# Patient Record
Sex: Male | Born: 1940
Health system: Southern US, Community
[De-identification: ages and names within clinical notes are randomized; demographics above are authoritative.]

## PROBLEM LIST (undated history)

## (undated) DIAGNOSIS — R112 Nausea with vomiting, unspecified: Secondary | ICD-10-CM

## (undated) DIAGNOSIS — I208 Other forms of angina pectoris: Secondary | ICD-10-CM

## (undated) DIAGNOSIS — I10 Essential (primary) hypertension: Secondary | ICD-10-CM

## (undated) DIAGNOSIS — M1990 Unspecified osteoarthritis, unspecified site: Secondary | ICD-10-CM

## (undated) DIAGNOSIS — Z923 Personal history of irradiation: Secondary | ICD-10-CM

## (undated) DIAGNOSIS — I2510 Atherosclerotic heart disease of native coronary artery without angina pectoris: Secondary | ICD-10-CM

## (undated) DIAGNOSIS — F32A Depression, unspecified: Secondary | ICD-10-CM

## (undated) DIAGNOSIS — E119 Type 2 diabetes mellitus without complications: Secondary | ICD-10-CM

## (undated) DIAGNOSIS — K219 Gastro-esophageal reflux disease without esophagitis: Secondary | ICD-10-CM

## (undated) DIAGNOSIS — J439 Emphysema, unspecified: Secondary | ICD-10-CM

## (undated) DIAGNOSIS — R42 Dizziness and giddiness: Secondary | ICD-10-CM

## (undated) DIAGNOSIS — E7800 Pure hypercholesterolemia, unspecified: Secondary | ICD-10-CM

## (undated) DIAGNOSIS — F329 Major depressive disorder, single episode, unspecified: Secondary | ICD-10-CM

## (undated) DIAGNOSIS — R972 Elevated prostate specific antigen [PSA]: Secondary | ICD-10-CM

## (undated) DIAGNOSIS — Z9889 Other specified postprocedural states: Secondary | ICD-10-CM

## (undated) DIAGNOSIS — I2089 Other forms of angina pectoris: Secondary | ICD-10-CM

## (undated) DIAGNOSIS — J45909 Unspecified asthma, uncomplicated: Secondary | ICD-10-CM

## (undated) DIAGNOSIS — G4730 Sleep apnea, unspecified: Secondary | ICD-10-CM

## (undated) DIAGNOSIS — F419 Anxiety disorder, unspecified: Secondary | ICD-10-CM

## (undated) DIAGNOSIS — C61 Malignant neoplasm of prostate: Secondary | ICD-10-CM

## (undated) HISTORY — PX: TONSILLECTOMY: SUR1361

## (undated) HISTORY — DX: Sleep apnea, unspecified: G47.30

## (undated) HISTORY — DX: Dizziness and giddiness: R42

## (undated) HISTORY — DX: Pure hypercholesterolemia, unspecified: E78.00

## (undated) HISTORY — PX: FOOT SURGERY: SHX648

## (undated) HISTORY — DX: Emphysema, unspecified: J43.9

## (undated) HISTORY — DX: Essential (primary) hypertension: I10

## (undated) HISTORY — DX: Other forms of angina pectoris: I20.8

## (undated) HISTORY — DX: Other forms of angina pectoris: I20.89

## (undated) HISTORY — DX: Type 2 diabetes mellitus without complications: E11.9

## (undated) HISTORY — PX: BACK SURGERY: SHX140

## (undated) HISTORY — DX: Personal history of irradiation: Z92.3

---

## 1998-08-23 HISTORY — PX: OTHER SURGICAL HISTORY: SHX169

## 2004-08-23 HISTORY — PX: BACK SURGERY: SHX140

## 2004-08-23 HISTORY — PX: OTHER SURGICAL HISTORY: SHX169

## 2006-08-23 HISTORY — PX: HERNIA REPAIR: SHX51

## 2011-09-28 DIAGNOSIS — R5381 Other malaise: Secondary | ICD-10-CM | POA: Diagnosis not present

## 2011-09-28 DIAGNOSIS — E7800 Pure hypercholesterolemia, unspecified: Secondary | ICD-10-CM | POA: Diagnosis not present

## 2011-10-04 DIAGNOSIS — J449 Chronic obstructive pulmonary disease, unspecified: Secondary | ICD-10-CM | POA: Diagnosis not present

## 2011-10-04 DIAGNOSIS — G4733 Obstructive sleep apnea (adult) (pediatric): Secondary | ICD-10-CM | POA: Diagnosis not present

## 2011-10-06 DIAGNOSIS — I2510 Atherosclerotic heart disease of native coronary artery without angina pectoris: Secondary | ICD-10-CM | POA: Diagnosis not present

## 2011-10-06 DIAGNOSIS — J0100 Acute maxillary sinusitis, unspecified: Secondary | ICD-10-CM | POA: Diagnosis not present

## 2011-10-06 DIAGNOSIS — E7800 Pure hypercholesterolemia, unspecified: Secondary | ICD-10-CM | POA: Diagnosis not present

## 2011-10-06 DIAGNOSIS — E119 Type 2 diabetes mellitus without complications: Secondary | ICD-10-CM | POA: Diagnosis not present

## 2011-10-26 DIAGNOSIS — I2510 Atherosclerotic heart disease of native coronary artery without angina pectoris: Secondary | ICD-10-CM | POA: Diagnosis not present

## 2011-10-26 DIAGNOSIS — I10 Essential (primary) hypertension: Secondary | ICD-10-CM | POA: Diagnosis not present

## 2011-10-26 DIAGNOSIS — I209 Angina pectoris, unspecified: Secondary | ICD-10-CM | POA: Diagnosis not present

## 2011-10-26 DIAGNOSIS — E7800 Pure hypercholesterolemia, unspecified: Secondary | ICD-10-CM | POA: Diagnosis not present

## 2011-12-30 DIAGNOSIS — Z77090 Contact with and (suspected) exposure to asbestos: Secondary | ICD-10-CM | POA: Diagnosis not present

## 2011-12-30 DIAGNOSIS — G4733 Obstructive sleep apnea (adult) (pediatric): Secondary | ICD-10-CM | POA: Diagnosis not present

## 2011-12-30 DIAGNOSIS — R05 Cough: Secondary | ICD-10-CM | POA: Diagnosis not present

## 2011-12-30 DIAGNOSIS — J449 Chronic obstructive pulmonary disease, unspecified: Secondary | ICD-10-CM | POA: Diagnosis not present

## 2012-01-03 DIAGNOSIS — M129 Arthropathy, unspecified: Secondary | ICD-10-CM | POA: Diagnosis not present

## 2012-01-03 DIAGNOSIS — E7800 Pure hypercholesterolemia, unspecified: Secondary | ICD-10-CM | POA: Diagnosis not present

## 2012-01-03 DIAGNOSIS — E119 Type 2 diabetes mellitus without complications: Secondary | ICD-10-CM | POA: Diagnosis not present

## 2012-01-03 DIAGNOSIS — K219 Gastro-esophageal reflux disease without esophagitis: Secondary | ICD-10-CM | POA: Diagnosis not present

## 2012-01-19 DIAGNOSIS — Z77090 Contact with and (suspected) exposure to asbestos: Secondary | ICD-10-CM | POA: Diagnosis not present

## 2012-01-19 DIAGNOSIS — J438 Other emphysema: Secondary | ICD-10-CM | POA: Diagnosis not present

## 2012-01-19 DIAGNOSIS — R05 Cough: Secondary | ICD-10-CM | POA: Diagnosis not present

## 2012-01-19 DIAGNOSIS — R091 Pleurisy: Secondary | ICD-10-CM | POA: Diagnosis not present

## 2012-01-19 DIAGNOSIS — J479 Bronchiectasis, uncomplicated: Secondary | ICD-10-CM | POA: Diagnosis not present

## 2012-01-19 DIAGNOSIS — I7090 Unspecified atherosclerosis: Secondary | ICD-10-CM | POA: Diagnosis not present

## 2012-02-04 DIAGNOSIS — Z79899 Other long term (current) drug therapy: Secondary | ICD-10-CM | POA: Diagnosis not present

## 2012-02-04 DIAGNOSIS — J449 Chronic obstructive pulmonary disease, unspecified: Secondary | ICD-10-CM | POA: Diagnosis not present

## 2012-02-04 DIAGNOSIS — I2510 Atherosclerotic heart disease of native coronary artery without angina pectoris: Secondary | ICD-10-CM | POA: Diagnosis not present

## 2012-02-04 DIAGNOSIS — R079 Chest pain, unspecified: Secondary | ICD-10-CM | POA: Diagnosis not present

## 2012-02-04 DIAGNOSIS — J61 Pneumoconiosis due to asbestos and other mineral fibers: Secondary | ICD-10-CM | POA: Diagnosis not present

## 2012-02-04 DIAGNOSIS — I491 Atrial premature depolarization: Secondary | ICD-10-CM | POA: Diagnosis not present

## 2012-02-04 DIAGNOSIS — J4489 Other specified chronic obstructive pulmonary disease: Secondary | ICD-10-CM | POA: Diagnosis not present

## 2012-02-04 DIAGNOSIS — G4733 Obstructive sleep apnea (adult) (pediatric): Secondary | ICD-10-CM | POA: Diagnosis not present

## 2012-02-04 DIAGNOSIS — Z7902 Long term (current) use of antithrombotics/antiplatelets: Secondary | ICD-10-CM | POA: Diagnosis not present

## 2012-02-04 DIAGNOSIS — Z87891 Personal history of nicotine dependence: Secondary | ICD-10-CM | POA: Diagnosis not present

## 2012-02-04 DIAGNOSIS — E785 Hyperlipidemia, unspecified: Secondary | ICD-10-CM | POA: Diagnosis not present

## 2012-02-04 DIAGNOSIS — Z951 Presence of aortocoronary bypass graft: Secondary | ICD-10-CM | POA: Diagnosis not present

## 2012-02-04 DIAGNOSIS — R0602 Shortness of breath: Secondary | ICD-10-CM | POA: Diagnosis not present

## 2012-02-04 DIAGNOSIS — Z7982 Long term (current) use of aspirin: Secondary | ICD-10-CM | POA: Diagnosis not present

## 2012-02-04 DIAGNOSIS — R0789 Other chest pain: Secondary | ICD-10-CM | POA: Diagnosis not present

## 2012-02-04 DIAGNOSIS — E119 Type 2 diabetes mellitus without complications: Secondary | ICD-10-CM | POA: Diagnosis not present

## 2012-02-04 DIAGNOSIS — F329 Major depressive disorder, single episode, unspecified: Secondary | ICD-10-CM | POA: Diagnosis present

## 2012-02-16 DIAGNOSIS — E7800 Pure hypercholesterolemia, unspecified: Secondary | ICD-10-CM | POA: Diagnosis not present

## 2012-02-16 DIAGNOSIS — I209 Angina pectoris, unspecified: Secondary | ICD-10-CM | POA: Diagnosis not present

## 2012-02-16 DIAGNOSIS — I2510 Atherosclerotic heart disease of native coronary artery without angina pectoris: Secondary | ICD-10-CM | POA: Diagnosis not present

## 2012-02-16 DIAGNOSIS — I10 Essential (primary) hypertension: Secondary | ICD-10-CM | POA: Diagnosis not present

## 2012-02-28 DIAGNOSIS — I10 Essential (primary) hypertension: Secondary | ICD-10-CM | POA: Diagnosis not present

## 2012-02-28 DIAGNOSIS — I4949 Other premature depolarization: Secondary | ICD-10-CM | POA: Diagnosis not present

## 2012-02-28 DIAGNOSIS — I2510 Atherosclerotic heart disease of native coronary artery without angina pectoris: Secondary | ICD-10-CM | POA: Diagnosis not present

## 2012-02-28 DIAGNOSIS — I209 Angina pectoris, unspecified: Secondary | ICD-10-CM | POA: Diagnosis not present

## 2012-02-28 DIAGNOSIS — I491 Atrial premature depolarization: Secondary | ICD-10-CM | POA: Diagnosis not present

## 2012-02-28 DIAGNOSIS — E7800 Pure hypercholesterolemia, unspecified: Secondary | ICD-10-CM | POA: Diagnosis not present

## 2012-03-23 DIAGNOSIS — E7800 Pure hypercholesterolemia, unspecified: Secondary | ICD-10-CM | POA: Diagnosis not present

## 2012-03-23 DIAGNOSIS — J449 Chronic obstructive pulmonary disease, unspecified: Secondary | ICD-10-CM | POA: Diagnosis not present

## 2012-03-23 DIAGNOSIS — I2510 Atherosclerotic heart disease of native coronary artery without angina pectoris: Secondary | ICD-10-CM | POA: Diagnosis not present

## 2012-03-23 DIAGNOSIS — G4733 Obstructive sleep apnea (adult) (pediatric): Secondary | ICD-10-CM | POA: Diagnosis not present

## 2012-03-23 DIAGNOSIS — R05 Cough: Secondary | ICD-10-CM | POA: Diagnosis not present

## 2012-03-23 DIAGNOSIS — M129 Arthropathy, unspecified: Secondary | ICD-10-CM | POA: Diagnosis not present

## 2012-03-23 DIAGNOSIS — E119 Type 2 diabetes mellitus without complications: Secondary | ICD-10-CM | POA: Diagnosis not present

## 2012-03-23 DIAGNOSIS — Z77090 Contact with and (suspected) exposure to asbestos: Secondary | ICD-10-CM | POA: Diagnosis not present

## 2012-03-23 DIAGNOSIS — K219 Gastro-esophageal reflux disease without esophagitis: Secondary | ICD-10-CM | POA: Diagnosis not present

## 2012-04-13 DIAGNOSIS — G4733 Obstructive sleep apnea (adult) (pediatric): Secondary | ICD-10-CM | POA: Diagnosis not present

## 2012-05-08 DIAGNOSIS — Z23 Encounter for immunization: Secondary | ICD-10-CM | POA: Diagnosis not present

## 2012-05-26 DIAGNOSIS — M8448XA Pathological fracture, other site, initial encounter for fracture: Secondary | ICD-10-CM | POA: Diagnosis not present

## 2012-06-26 DIAGNOSIS — J449 Chronic obstructive pulmonary disease, unspecified: Secondary | ICD-10-CM | POA: Diagnosis not present

## 2012-06-26 DIAGNOSIS — G4733 Obstructive sleep apnea (adult) (pediatric): Secondary | ICD-10-CM | POA: Diagnosis not present

## 2012-06-28 DIAGNOSIS — I2510 Atherosclerotic heart disease of native coronary artery without angina pectoris: Secondary | ICD-10-CM | POA: Diagnosis not present

## 2012-06-28 DIAGNOSIS — E119 Type 2 diabetes mellitus without complications: Secondary | ICD-10-CM | POA: Diagnosis not present

## 2012-06-28 DIAGNOSIS — E7800 Pure hypercholesterolemia, unspecified: Secondary | ICD-10-CM | POA: Diagnosis not present

## 2012-08-25 DIAGNOSIS — E119 Type 2 diabetes mellitus without complications: Secondary | ICD-10-CM | POA: Diagnosis not present

## 2012-08-25 DIAGNOSIS — H2589 Other age-related cataract: Secondary | ICD-10-CM | POA: Diagnosis not present

## 2012-08-31 DIAGNOSIS — J449 Chronic obstructive pulmonary disease, unspecified: Secondary | ICD-10-CM | POA: Diagnosis not present

## 2012-08-31 DIAGNOSIS — G4733 Obstructive sleep apnea (adult) (pediatric): Secondary | ICD-10-CM | POA: Diagnosis not present

## 2012-09-01 DIAGNOSIS — E7800 Pure hypercholesterolemia, unspecified: Secondary | ICD-10-CM | POA: Diagnosis not present

## 2012-09-01 DIAGNOSIS — K219 Gastro-esophageal reflux disease without esophagitis: Secondary | ICD-10-CM | POA: Diagnosis not present

## 2012-09-01 DIAGNOSIS — I2510 Atherosclerotic heart disease of native coronary artery without angina pectoris: Secondary | ICD-10-CM | POA: Diagnosis not present

## 2012-09-01 DIAGNOSIS — E119 Type 2 diabetes mellitus without complications: Secondary | ICD-10-CM | POA: Diagnosis not present

## 2012-10-12 ENCOUNTER — Institutional Professional Consult (permissible substitution): Payer: Self-pay | Admitting: Internal Medicine

## 2012-11-17 DIAGNOSIS — I25810 Atherosclerosis of coronary artery bypass graft(s) without angina pectoris: Secondary | ICD-10-CM | POA: Diagnosis not present

## 2012-11-17 DIAGNOSIS — G4733 Obstructive sleep apnea (adult) (pediatric): Secondary | ICD-10-CM | POA: Diagnosis not present

## 2012-11-17 DIAGNOSIS — J449 Chronic obstructive pulmonary disease, unspecified: Secondary | ICD-10-CM | POA: Diagnosis not present

## 2012-11-17 DIAGNOSIS — E785 Hyperlipidemia, unspecified: Secondary | ICD-10-CM | POA: Diagnosis not present

## 2012-11-17 DIAGNOSIS — E119 Type 2 diabetes mellitus without complications: Secondary | ICD-10-CM | POA: Diagnosis not present

## 2012-11-17 DIAGNOSIS — I2510 Atherosclerotic heart disease of native coronary artery without angina pectoris: Secondary | ICD-10-CM | POA: Diagnosis not present

## 2012-11-17 DIAGNOSIS — I10 Essential (primary) hypertension: Secondary | ICD-10-CM | POA: Diagnosis not present

## 2012-11-17 DIAGNOSIS — G609 Hereditary and idiopathic neuropathy, unspecified: Secondary | ICD-10-CM | POA: Diagnosis not present

## 2012-11-23 ENCOUNTER — Encounter: Payer: Self-pay | Admitting: Internal Medicine

## 2012-11-23 ENCOUNTER — Ambulatory Visit (INDEPENDENT_AMBULATORY_CARE_PROVIDER_SITE_OTHER)
Admission: RE | Admit: 2012-11-23 | Discharge: 2012-11-23 | Disposition: A | Discharge status: Home / Self Care | Payer: Medicare Other | Source: Ambulatory Visit | Attending: Internal Medicine | Admitting: Internal Medicine

## 2012-11-23 ENCOUNTER — Ambulatory Visit (INDEPENDENT_AMBULATORY_CARE_PROVIDER_SITE_OTHER): Payer: Medicare Other | Admitting: Internal Medicine

## 2012-11-23 VITALS — BP 140/62 | HR 79 | Ht 71.0 in | Wt 196.0 lb

## 2012-11-23 DIAGNOSIS — J438 Other emphysema: Secondary | ICD-10-CM | POA: Diagnosis not present

## 2012-11-23 DIAGNOSIS — J449 Chronic obstructive pulmonary disease, unspecified: Secondary | ICD-10-CM

## 2012-11-23 DIAGNOSIS — G4733 Obstructive sleep apnea (adult) (pediatric): Secondary | ICD-10-CM

## 2012-11-23 DIAGNOSIS — J441 Chronic obstructive pulmonary disease with (acute) exacerbation: Secondary | ICD-10-CM | POA: Diagnosis not present

## 2012-11-23 NOTE — Progress Notes (Signed)
48 yoM self referred for  COPD with emphysema diagnosed 2006, complicated by CAD/CABG 4V, DM.       Here with wife Smoked one pack per day until quitting in 2000. 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 He says this was associated with asbestos exposure from brake dust. Issues are in legal consideration and he notes that no diagnosis has been made. Has had pneumonia twice, and pneumonia vaccine. History of sleep apnea diagnosis but quit CPAP. Wife confirms he snores wearing a chin strap and sleeping in a recliner. Significant injury at work 01/17/1999, broken tibia and "degloving" of R  lower leg.  Prior to Admission medications   Medication Sig Start Date End Date Taking? Authorizing Provider  aspirin 81 MG tablet Take 81 mg by mouth daily.   Yes Historical Provider, MD  atorvastatin (LIPITOR) 40 MG tablet Take 40 mg by mouth daily.   Yes Historical Provider, MD  Biotin 5000 MCG TABS Take 2 capsules by mouth daily.   Yes Historical Provider, MD  Cinnamon 500 MG capsule Take 1,000 mg by mouth daily.   Yes Historical Provider, MD  clopidogrel (PLAVIX) 75 MG tablet Take 75 mg by mouth daily.   Yes Historical Provider, MD  DULoxetine (CYMBALTA) 60 MG capsule Take 60 mg by mouth daily.   Yes Historical Provider, MD  Flaxseed, Linseed, (FLAX SEED OIL) 1300 MG CAPS Take 1 capsule by mouth 2 (two) times daily.   Yes Historical Provider, MD  isosorbide mononitrate (IMDUR) 30 MG 24 hr tablet Take 30 mg by mouth daily.   Yes Historical Provider, MD  metFORMIN (GLUCOPHAGE) 500 MG tablet Take 500 mg by mouth daily with breakfast.   Yes Historical Provider, MD  metoprolol (LOPRESSOR) 50 MG tablet Take 50 mg by mouth 2 (two) times daily.   Yes Historical Provider, MD  niacin 500 MG tablet Take 1,000 mg by mouth every evening.   Yes Historical Provider, MD  omeprazole (PRILOSEC) 20 MG capsule Take 20 mg by mouth daily.    Yes Historical Provider, MD   Past Medical History  Diagnosis Date  . Hypertension   . Angina at rest   . Emphysema   . Diabetes   . Sleep apnea   . Hypercholesteremia    Past Surgical History  Procedure Laterality Date  . Neck surgery  2005  . Heart bypass  2006    [facility]   Family History  Problem Relation Age of Onset  . Heart disease Mother   .      History   Social History  . Marital Status: Married    Spouse Name: N/A    Number of Children: N/A  . Years of Education: N/A   Occupational History  . Retired     Social History Main Topics  . Smoking status: Former Smoker -- 1.00 packs/day for 40 years    Types: Cigarettes    Quit date: 01/17/1999  . Smokeless tobacco: Not on file  . Alcohol Use: Yes     Comment: 1 beer weekly  . Drug Use: No  . Sexually Active: Not on file   Other Topics Concern  . Not on file   Social History Narrative  . No narrative on file   ROS-see HPI Constitutional:   No-   weight loss, night sweats, fevers, chills, fatigue, lassitude. HEENT:   No-  headaches, difficulty swallowing, tooth/dental problems, sore throat,  No-  sneezing, itching, ear ache, nasal congestion, post nasal drip,  CV:  No-   chest pain, orthopnea, PND, swelling in lower extremities, anasarca,                                  dizziness, palpitations Resp: + shortness of breath with exertion or at rest.              No-   productive cough,  No non-productive cough,  No- coughing up of blood.              No-   change in color of mucus.  No- wheezing.   Skin: No-   rash or lesions. GI:  No-   heartburn, indigestion, abdominal pain, nausea, vomiting, diarrhea,                 change in bowel habits, loss of appetite GU: No-   dysuria, change in color of urine, no urgency or frequency.  No- flank pain. MS:  No-   joint pain or swelling.  No- decreased range of motion.  No- back pain. Neuro-     nothing unusual Psych:  No- change in mood or affect. +  depression or anxiety.  No memory loss.  OBJ- Physical Exam General- Alert, Oriented, Affect-appropriate, Distress- none acute. Trim Skin- rash-none, lesions- none, excoriation- none Lymphadenopathy- none Head- atraumatic            Eyes- Gross vision intact, PERRLA, conjunctivae and secretions clear            Ears- Hearing, canals-normal            Nose- Clear, no-Septal dev, mucus, polyps, erosion, perforation             Throat- Mallampati II , mucosa clear , drainage- none, tonsils- atrophic Neck- flexible , trachea midline, no stridor , thyroid nl, carotid no bruit Chest - symmetrical excursion , unlabored           Heart/CV- RRR , no murmur , no gallop  , no rub, nl s1 s2                           - JVD- none , edema- none, stasis changes- none, varices- none           Lung- clear to P&A, wheeze- none, cough- none , dullness-none, rub- none           Chest wall-  Abd- tender-no, distended-no, bowel sounds-present, HSM- no Br/ Gen/ Rectal- Not done, not indicated Extrem- cyanosis- none, clubbing, none, atrophy- none, strength- nl. -+cane. Support stockings Neuro- grossly intact to observation

## 2012-11-23 NOTE — Patient Instructions (Addendum)
Ok to use [employer] as needed   Order- CXR                                       Dx COPD             Schedule PFT

## 2012-12-03 ENCOUNTER — Encounter: Payer: Self-pay | Admitting: Internal Medicine

## 2012-12-03 DIAGNOSIS — G4733 Obstructive sleep apnea (adult) (pediatric): Secondary | ICD-10-CM | POA: Insufficient documentation

## 2012-12-03 DIAGNOSIS — T751XXA Unspecified effects of drowning and nonfatal submersion, initial encounter: Secondary | ICD-10-CM | POA: Insufficient documentation

## 2012-12-03 DIAGNOSIS — J449 Chronic obstructive pulmonary disease, unspecified: Secondary | ICD-10-CM | POA: Insufficient documentation

## 2012-12-03 NOTE — Assessment & Plan Note (Signed)
Wife works that he snores despite chin strap, sitting in recliner asleep. We agreed we would discuss this further at a future meeting.

## 2012-12-03 NOTE — Assessment & Plan Note (Signed)
Baseline status is unclear. Probably mostly emphysema. Plan-chest x-ray, schedule PFT, review outside records as available.

## 2013-01-01 ENCOUNTER — Ambulatory Visit (INDEPENDENT_AMBULATORY_CARE_PROVIDER_SITE_OTHER): Payer: Medicare Other | Admitting: Internal Medicine

## 2013-01-01 ENCOUNTER — Telehealth: Payer: Self-pay | Admitting: Internal Medicine

## 2013-01-01 DIAGNOSIS — J449 Chronic obstructive pulmonary disease, unspecified: Secondary | ICD-10-CM | POA: Diagnosis not present

## 2013-01-01 DIAGNOSIS — J441 Chronic obstructive pulmonary disease with (acute) exacerbation: Secondary | ICD-10-CM

## 2013-01-01 LAB — PULMONARY FUNCTION TEST

## 2013-01-01 NOTE — Progress Notes (Signed)
PFT done today. 

## 2013-01-01 NOTE — Telephone Encounter (Signed)
lmomtcb x1 

## 2013-01-02 NOTE — Telephone Encounter (Signed)
ATC [name], line rang > 10x with no answer and no option to LM.  WCB.

## 2013-01-03 NOTE — Telephone Encounter (Signed)
ATC [name] at the # provided (this is the only contact # we have for [name] or the pt) - line rang multiple times with NA and no option to leave msg.  WCB

## 2013-01-04 NOTE — Telephone Encounter (Signed)
Last OV 11-23-12. I spoke with the pt spouse and she states the pt used to be on 3 liters through his CPAP before they moved to [place] and she wants this restarted. She states the pt wears his CPAP every night but she still witnesses him "holding his breath" several times a night and feels that he needs the oxygen through his cpap again. She states the pt sleeps in a recliner because he stops breathing during the night. I advised the spouse that we may need to order an ONO since they have recently moved and established with a new homecare company. Please advise.   Also pt is requesting results on PFT. Please advise. Carron Curie, CMA

## 2013-01-05 NOTE — Telephone Encounter (Signed)
Called, spoke with pt's wife. Informed her of below per CDY. States she is now making pt use cpap, but he is still gasping and holding his breath with it.   States they have used [facility] in the past. I have placed order to have DME do a RA oximetry at rest and with exertion and have placed order to have ONO on RA done as well. Wife is aware orders have been placed and to call back if they do not hear something by next week.   She verbalized understanding and voiced no further questions or concerns at this time.  ** Pt has a pending OV with CDY on 01/10/13 at 9:45 am -- wife aware of appt.

## 2013-01-05 NOTE — Telephone Encounter (Signed)
Daniel Singleton returning call can be reached at [phone].Daniel Singleton

## 2013-01-05 NOTE — Telephone Encounter (Signed)
He told me he wasn't using CPAP??  Wife describes him holding his breath at night. That sounds more like sleep apnea and CPAP should prevent it, if he will use the CPAP.   To get O2, we need documentation that his oxygen level gets low. Need room air oximetry at rest, with exertion, during sleep.   Ok to set up through a DME.  His PFT shows very mild obstructive disease/ COPD.

## 2013-01-05 NOTE — Telephone Encounter (Signed)
[id number] to ask if the pt has a DME company that he already uses, not in chart. No orders have been placed yet. Carron Curie, CMA

## 2013-01-10 ENCOUNTER — Encounter: Payer: Self-pay | Admitting: Internal Medicine

## 2013-01-10 ENCOUNTER — Ambulatory Visit (INDEPENDENT_AMBULATORY_CARE_PROVIDER_SITE_OTHER): Payer: Medicare Other | Admitting: Internal Medicine

## 2013-01-10 VITALS — BP 122/76 | HR 77 | Ht 71.0 in | Wt 197.8 lb

## 2013-01-10 DIAGNOSIS — J439 Emphysema, unspecified: Secondary | ICD-10-CM

## 2013-01-10 DIAGNOSIS — G4733 Obstructive sleep apnea (adult) (pediatric): Secondary | ICD-10-CM | POA: Diagnosis not present

## 2013-01-10 DIAGNOSIS — J438 Other emphysema: Secondary | ICD-10-CM

## 2013-01-10 NOTE — Progress Notes (Signed)
43 yoM self referred for  COPD with emphysema diagnosed 2006, complicated by CAD/CABG 4V, DM.       Here with wife Smoked one pack per day until quitting in 2000. 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 He says this was associated with asbestos exposure from brake dust. Issues are in legal consideration and he notes that no diagnosis has been made. Has had pneumonia twice, and pneumonia vaccine. History of sleep apnea diagnosis but quit CPAP. Wife confirms he snores wearing a chin strap and sleeping in a recliner. Significant injury at work 01/17/1999, broken tibia and "degloving" of R  lower leg.  01/10/13- self referred for  COPD with emphysema diagnosed 2006, OSA, complicated by CAD/CABG 4V, DM, hx major trauma leg.       Here with wife Mowing the yard with mask in dusty but will still cough. Wife says if he lies down a recliner he holds his breath saturation will drop to 86%.he says he has been wearing his CPAP again/[facility], but she describes apneas when he is sleeping without it.  PFT 01/01/2013: Minimal obstructive airways disease, air trapping with increased residual volume, mild response to bronchodilator, diffusion slightly reduced. FVC 4.11/96%, FEV1 2.59/82%, FEV1/FVC 0.63/85%, FEF 25-75% 1.63/68%. TLC 100%, DLCO 76%. CXR 11/23/12 IMPRESSION:  Underlying emphysema. No edema or consolidation.  Original Report Authenticated By: Bretta Bang, M.D.   ROS-see HPI Constitutional:   No-   weight loss, night sweats, fevers, chills, fatigue, lassitude. HEENT:   No-  headaches, difficulty swallowing, tooth/dental problems, sore throat,       No-  sneezing, itching, ear ache, nasal congestion, post nasal drip,  CV:  No-   chest pain, orthopnea, PND, swelling in lower extremities, anasarca,                                  dizziness, palpitations Resp: + shortness of breath with exertion or at rest.             No-   productive cough,  No non-productive cough,  No- coughing up of blood.              No-   change in color of mucus.  No- wheezing.   Skin: No-   rash or lesions. GI:  No-   heartburn, indigestion, abdominal pain, nausea, vomiting, GU:  MS:  No-   joint pain or swelling.  . Neuro-     nothing unusual Psych:  No- change in mood or affect. + depression or anxiety.  No memory loss.  OBJ- Physical Exam General- Alert, Oriented, Affect-appropriate, Distress- none acute. Trim Skin- rash-none, lesions- none, excoriation- none Lymphadenopathy- none Head- atraumatic            Eyes- Gross vision intact, PERRLA, conjunctivae and secretions clear            Ears- Hearing, canals-normal            Nose- Clear, no-Septal dev, mucus, polyps, erosion, perforation             Throat- Mallampati II , mucosa clear , drainage- none, tonsils- atrophic Neck- flexible , trachea midline, no stridor , thyroid nl, carotid no bruit Chest - symmetrical excursion , unlabored           Heart/CV- RRR , no murmur , no gallop  , no  rub, nl s1 s2                           - JVD- none , edema- none, stasis changes- none, varices- none           Lung- clear to P&A, wheeze- none, cough- none , dullness-none, rub- none           Chest wall-  Abd-  Br/ Gen/ Rectal- Not done, not indicated Extrem- cyanosis- none, clubbing, none, atrophy- none, strength- nl. -+cane. Support stockings Neuro- grossly intact to observation

## 2013-01-10 NOTE — Patient Instructions (Addendum)
Order- refer to daytime sleep center staff for CPAP mask fitting recommendation  Please call as needed

## 2013-01-16 ENCOUNTER — Ambulatory Visit (HOSPITAL_BASED_OUTPATIENT_CLINIC_OR_DEPARTMENT_OTHER): Payer: Medicare Other | Attending: Internal Medicine | Admitting: Radiology

## 2013-01-16 ENCOUNTER — Telehealth: Payer: Self-pay | Admitting: Internal Medicine

## 2013-01-16 DIAGNOSIS — Z9989 Dependence on other enabling machines and devices: Secondary | ICD-10-CM

## 2013-01-16 DIAGNOSIS — G4733 Obstructive sleep apnea (adult) (pediatric): Secondary | ICD-10-CM

## 2013-01-16 NOTE — Telephone Encounter (Signed)
Left detailed message on patient machine in regards to [facility] orders. Per Florentina Addison these are are in progress of being sorted out and patient will be contact in the AM in regards to this.  Will forward message to Florentina Addison to follow up on in the AM.

## 2013-01-16 NOTE — Telephone Encounter (Signed)
Attempted to call the pt's wife. Her phone kept breaking up and I could not understand what she was saying. Will call back later.

## 2013-01-17 ENCOUNTER — Other Ambulatory Visit (HOSPITAL_BASED_OUTPATIENT_CLINIC_OR_DEPARTMENT_OTHER): Payer: Medicare Other

## 2013-01-17 NOTE — Telephone Encounter (Signed)
LMTCB- unsure what wife is speaking of when she says CPAP machine; we only placed orders for ONO and walk test through [facility]. [name] got signed orders for this yesterday as there were questions of how CY wanted tests done. [name] to ask to speak directly with me about phone message.

## 2013-01-17 NOTE — Telephone Encounter (Signed)
Pt's wife called back again. She says [facility] still won't give pt a mask. She would like this cleared up asap. Her cell # [phone]. Hazel Sams

## 2013-01-18 NOTE — Telephone Encounter (Signed)
LMTCB-ask for Daniel Singleton.  

## 2013-01-18 NOTE — Telephone Encounter (Signed)
I have sent staff message to Henderson Newcomer to find out what is going on. Will update wife as I hear something back.

## 2013-01-22 ENCOUNTER — Telehealth: Payer: Self-pay | Admitting: Internal Medicine

## 2013-01-22 ENCOUNTER — Encounter: Payer: Self-pay | Admitting: Internal Medicine

## 2013-01-22 DIAGNOSIS — J449 Chronic obstructive pulmonary disease, unspecified: Secondary | ICD-10-CM | POA: Diagnosis not present

## 2013-01-22 DIAGNOSIS — G4733 Obstructive sleep apnea (adult) (pediatric): Secondary | ICD-10-CM | POA: Diagnosis not present

## 2013-01-22 DIAGNOSIS — J441 Chronic obstructive pulmonary disease with (acute) exacerbation: Secondary | ICD-10-CM | POA: Diagnosis not present

## 2013-01-22 NOTE — Telephone Encounter (Signed)
Pt received a call from [facility]. When pt returned the call, no one will answer. Pt needs cpap mask.Leaving in the AM and will be gone for a week.  Pt wife [phone]

## 2013-01-22 NOTE — Telephone Encounter (Signed)
Spoke with [name]-- [name] states no one will be in office for patient to pick up mask until after lunch time.  Spoke with [name] to inform her of this and she is already aware, she stopped by office and has made an appt for 1230 at [facility] to pick up mask. Nothing further needed at this time

## 2013-01-22 NOTE — Telephone Encounter (Signed)
Spoke with patients wife, she states she has still been unable to get the CPAP mask-- has stopped by office and they will not give her one  I spoke with [name] w [facility] she states that patients spouse should be able to pick up mask at office with no problem, [name] will call the office to let them know patient can do this and to have [name] go to office now to pick up  I have spoken back with [name] and informed her to go ahead and stop by [facility] office--[address] per [place] and pick up mask. If she has any problems to call our office. Nothing further needed at this time

## 2013-01-22 NOTE — Assessment & Plan Note (Signed)
Wife describes apneas when he sleeps without CPAP. He needs daytime mask fitting to improve compliance.

## 2013-01-22 NOTE — Assessment & Plan Note (Signed)
Mild COPD with emphysema and slight response to bronchodilator in small airway flows. Implications discussed. Plan-overnight oximetry is still pending

## 2013-01-26 ENCOUNTER — Ambulatory Visit: Payer: Medicare Other | Admitting: Internal Medicine

## 2013-01-29 ENCOUNTER — Encounter: Payer: Self-pay | Admitting: Internal Medicine

## 2013-01-31 ENCOUNTER — Telehealth: Payer: Self-pay | Admitting: Internal Medicine

## 2013-01-31 DIAGNOSIS — J439 Emphysema, unspecified: Secondary | ICD-10-CM

## 2013-01-31 DIAGNOSIS — G4733 Obstructive sleep apnea (adult) (pediatric): Secondary | ICD-10-CM

## 2013-01-31 NOTE — Telephone Encounter (Signed)
I do not see any ONO results in my folder on my desk; I called [facility] and they have multiple answers about the ONO; therefore I have sent a staff message to Henderson Newcomer asking her to follow up with me about this. Will await a message or call from [facility] about this.

## 2013-02-02 NOTE — Telephone Encounter (Signed)
Katie please advise if this has been taken care of, thank you!

## 2013-02-02 NOTE — Telephone Encounter (Signed)
I received forms that needed to be signed per [facility] with [facility] has signed order stating he did order ONO on patient. I have faxed this back to Sheryce's attention at [phone]. Will await ONO results and have CY advise; then call patient and his wife.

## 2013-02-05 ENCOUNTER — Telehealth: Payer: Self-pay | Admitting: Internal Medicine

## 2013-02-05 NOTE — Telephone Encounter (Signed)
Per CY-pt qualifies for O2 2 L/M sleep based on ONO 01-22-13 results. I spoke with patient and made him aware. He is aware that order has been sent to [facility] to send to [facility]. ONO results sent to scan in EPIC. Nothing more needed. Will sign off on message.

## 2013-02-05 NOTE — Telephone Encounter (Signed)
[name] with [facility] needed to know if CY would like to have pt's O2 bled through his CPAP at night; per CY YES we want the O2 bled through his CPAP. [name] is aware and nothing more needed at this time.

## 2013-02-19 ENCOUNTER — Encounter: Payer: Self-pay | Admitting: Internal Medicine

## 2013-02-22 DIAGNOSIS — E785 Hyperlipidemia, unspecified: Secondary | ICD-10-CM | POA: Diagnosis not present

## 2013-02-22 DIAGNOSIS — E119 Type 2 diabetes mellitus without complications: Secondary | ICD-10-CM | POA: Diagnosis not present

## 2013-02-22 DIAGNOSIS — I10 Essential (primary) hypertension: Secondary | ICD-10-CM | POA: Diagnosis not present

## 2013-02-22 DIAGNOSIS — R972 Elevated prostate specific antigen [PSA]: Secondary | ICD-10-CM | POA: Diagnosis not present

## 2013-02-22 DIAGNOSIS — Z125 Encounter for screening for malignant neoplasm of prostate: Secondary | ICD-10-CM | POA: Diagnosis not present

## 2013-02-22 DIAGNOSIS — I2510 Atherosclerotic heart disease of native coronary artery without angina pectoris: Secondary | ICD-10-CM | POA: Diagnosis not present

## 2013-03-01 DIAGNOSIS — Z1331 Encounter for screening for depression: Secondary | ICD-10-CM | POA: Diagnosis not present

## 2013-03-01 DIAGNOSIS — I25810 Atherosclerosis of coronary artery bypass graft(s) without angina pectoris: Secondary | ICD-10-CM | POA: Diagnosis not present

## 2013-03-01 DIAGNOSIS — Z0000 Encounter for general adult medical examination without abnormal findings: Secondary | ICD-10-CM | POA: Diagnosis not present

## 2013-03-01 DIAGNOSIS — R972 Elevated prostate specific antigen [PSA]: Secondary | ICD-10-CM | POA: Diagnosis not present

## 2013-03-01 DIAGNOSIS — E785 Hyperlipidemia, unspecified: Secondary | ICD-10-CM | POA: Diagnosis not present

## 2013-03-01 DIAGNOSIS — I10 Essential (primary) hypertension: Secondary | ICD-10-CM | POA: Diagnosis not present

## 2013-03-01 DIAGNOSIS — E1169 Type 2 diabetes mellitus with other specified complication: Secondary | ICD-10-CM | POA: Diagnosis not present

## 2013-03-01 DIAGNOSIS — J449 Chronic obstructive pulmonary disease, unspecified: Secondary | ICD-10-CM | POA: Diagnosis not present

## 2013-03-01 DIAGNOSIS — G4733 Obstructive sleep apnea (adult) (pediatric): Secondary | ICD-10-CM | POA: Diagnosis not present

## 2013-03-01 DIAGNOSIS — Z1212 Encounter for screening for malignant neoplasm of rectum: Secondary | ICD-10-CM | POA: Diagnosis not present

## 2013-03-05 ENCOUNTER — Ambulatory Visit (INDEPENDENT_AMBULATORY_CARE_PROVIDER_SITE_OTHER): Payer: Medicare Other | Admitting: Internal Medicine

## 2013-03-05 ENCOUNTER — Encounter: Payer: Self-pay | Admitting: Internal Medicine

## 2013-03-05 VITALS — BP 124/82 | HR 78 | Ht 71.0 in | Wt 204.2 lb

## 2013-03-05 DIAGNOSIS — G4733 Obstructive sleep apnea (adult) (pediatric): Secondary | ICD-10-CM

## 2013-03-05 DIAGNOSIS — J439 Emphysema, unspecified: Secondary | ICD-10-CM

## 2013-03-05 DIAGNOSIS — J438 Other emphysema: Secondary | ICD-10-CM

## 2013-03-05 DIAGNOSIS — R0902 Hypoxemia: Secondary | ICD-10-CM

## 2013-03-05 DIAGNOSIS — J449 Chronic obstructive pulmonary disease, unspecified: Secondary | ICD-10-CM | POA: Diagnosis not present

## 2013-03-05 NOTE — Progress Notes (Signed)
27 yoM self referred for  COPD with emphysema diagnosed 2006, complicated by CAD/CABG 4V, DM.       Here with wife Smoked one pack per day until quitting in 2000. 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 He says this was associated with asbestos exposure from brake dust. Issues are in legal consideration and he notes that no diagnosis has been made. Has had pneumonia twice, and pneumonia vaccine. History of sleep apnea diagnosis but quit CPAP. Wife confirms he snores wearing a chin strap and sleeping in a recliner. Significant injury at work 01/17/1999, broken tibia and "degloving" of R  lower leg.  01/10/13- self referred for  COPD with emphysema diagnosed 2006, OSA, complicated by CAD/CABG 4V, DM, hx major trauma leg.       Here with wife Mowing the yard with mask in dusty but will still cough. Wife says if he lies down a recliner he holds his breath saturation will drop to 86%.he says he has been wearing his CPAP again/[facility], but she describes apneas when he is sleeping without it.  PFT 01/01/2013: Minimal obstructive airways disease, air trapping with increased residual volume, mild response to bronchodilator, diffusion slightly reduced. FVC 4.11/96%, FEV1 2.59/82%, FEV1/FVC 0.63/85%, FEF 25-75% 1.63/68%. TLC 100%, DLCO 76%. CXR 11/23/12 IMPRESSION:  Underlying emphysema. No edema or consolidation.  Original Report Authenticated By: Bretta Bang, M.D.  03/05/13- self referred for  COPD with emphysema diagnosed 2006, OSA, complicated by CAD/CABG 4V, DM, hx major trauma leg.       Here with wife FOLLOWS FOR: pt reports breathing is doing well-- states concentrator is making a "racket and is on its last leg"-- denies any other concerns at this time. CPAP Autopap/ [facility] Mask hurts bridge of nose if he stretches it on too tightly. Oxygen concentrator is getting loud. Compliance okay but inadequate control  based on download. Needs broader pressure range for autotitration.  ROS-see HPI Constitutional:   No-   weight loss, night sweats, fevers, chills, +fatigue, lassitude. HEENT:   No-  headaches, difficulty swallowing, tooth/dental problems, sore throat,       No-  sneezing, itching, ear ache, nasal congestion, post nasal drip,  CV:  No-   chest pain, orthopnea, PND, swelling in lower extremities, anasarca, dizziness, palpitations Resp: + shortness of breath with exertion or at rest.              No-   productive cough,  No non-productive cough,  No- coughing up of blood.              No-   change in color of mucus.  No- wheezing.   Skin: No-   rash or lesions. GI:  No-   heartburn, indigestion, abdominal pain, nausea, vomiting, GU:  MS:  No-   joint pain or swelling.  . Neuro-     nothing unusual Psych:  No- change in mood or affect. + depression or anxiety.  No memory loss.  OBJ- Physical Exam General- Alert, Oriented, Affect-appropriate, Distress- none acute. Trim Skin- rash-none, lesions- none, excoriation- none Lymphadenopathy- none Head- atraumatic            Eyes- Gross vision intact, PERRLA, conjunctivae and secretions clear            Ears- Hearing, canals-normal            Nose- +red bridge of nose, Clear, no-Septal dev, mucus, polyps, erosion, perforation  Throat- Mallampati II , mucosa clear , drainage- none, tonsils- atrophic Neck- flexible , trachea midline, no stridor , thyroid nl, carotid no bruit Chest - symmetrical excursion , unlabored           Heart/CV- RRR , no murmur , no gallop  , no rub, nl s1 s2                           - JVD- none , edema- none, stasis changes- none, varices- none           Lung- clear to P&A, wheeze- none, cough- none , dullness-none, rub- none           Chest wall-  Abd-  Br/ Gen/ Rectal- Not done, not indicated Extrem- cyanosis- none, clubbing, none, atrophy- none, strength- nl. -+cane. Support stockings Neuro- grossly intact  to observation

## 2013-03-05 NOTE — Patient Instructions (Addendum)
Order- DME- 1) replacement CPAP mask of choice. Help with fit- he is getting red on bridge of nose.    Dx OSA                       2) Assess O2 concentrator for maintenance or replacement. Patient says it runs loud and hot.  2L/M for sleep    DX COPD with hypoxia                       3) Autotitrate 5-20 x 7 days for reassessment of pressure recommendation

## 2013-03-08 ENCOUNTER — Ambulatory Visit: Payer: Medicare Other | Admitting: Cardiovascular Disease

## 2013-03-16 ENCOUNTER — Ambulatory Visit (INDEPENDENT_AMBULATORY_CARE_PROVIDER_SITE_OTHER): Payer: Medicare Other | Admitting: Cardiovascular Disease

## 2013-03-16 ENCOUNTER — Encounter: Payer: Self-pay | Admitting: Cardiovascular Disease

## 2013-03-16 VITALS — BP 138/76 | HR 74 | Ht 70.0 in | Wt 202.4 lb

## 2013-03-16 DIAGNOSIS — E785 Hyperlipidemia, unspecified: Secondary | ICD-10-CM | POA: Diagnosis not present

## 2013-03-16 DIAGNOSIS — I10 Essential (primary) hypertension: Secondary | ICD-10-CM

## 2013-03-16 DIAGNOSIS — Z79899 Other long term (current) drug therapy: Secondary | ICD-10-CM

## 2013-03-16 DIAGNOSIS — I2510 Atherosclerotic heart disease of native coronary artery without angina pectoris: Secondary | ICD-10-CM

## 2013-03-16 DIAGNOSIS — Z951 Presence of aortocoronary bypass graft: Secondary | ICD-10-CM | POA: Diagnosis not present

## 2013-03-16 DIAGNOSIS — Z77090 Contact with and (suspected) exposure to asbestos: Secondary | ICD-10-CM

## 2013-03-16 MED ORDER — EZETIMIBE 10 MG PO TABS: 10.0000 mg | ORAL_TABLET | Freq: Every day | ORAL | Status: DC

## 2013-03-16 MED ORDER — RANOLAZINE ER 500 MG PO TB12: 500.0000 mg | ORAL_TABLET | Freq: Two times a day (BID) | ORAL | Status: DC

## 2013-03-16 NOTE — Patient Instructions (Addendum)
Your physician recommends that you schedule a follow-up appointment in: 2 months  Your physician recommends that you return for lab work in: 6 weeks CMP, NMR LIPIDs  Your physician has requested that you have an echocardiogram. Echocardiography is a painless test that uses sound waves to create images of your heart. It provides your doctor with information about the size and shape of your heart and how well your heart's chambers and valves are working. This procedure takes approximately one hour. There are no restrictions for this procedure.   Your physician has recommended you make the following change in your medication: Start Ranexa 500 mg twice a day, Zetia 10 mg daily

## 2013-03-20 NOTE — Assessment & Plan Note (Signed)
Plan- discussed mask fit and comfort. He will discuss with DME company.

## 2013-03-20 NOTE — Assessment & Plan Note (Signed)
Plan-[facility] see about replacing his old oxygen concentrator

## 2013-03-29 DIAGNOSIS — R972 Elevated prostate specific antigen [PSA]: Secondary | ICD-10-CM | POA: Diagnosis not present

## 2013-03-29 DIAGNOSIS — N400 Enlarged prostate without lower urinary tract symptoms: Secondary | ICD-10-CM | POA: Diagnosis not present

## 2013-03-29 DIAGNOSIS — R351 Nocturia: Secondary | ICD-10-CM | POA: Diagnosis not present

## 2013-03-29 DIAGNOSIS — R350 Frequency of micturition: Secondary | ICD-10-CM | POA: Diagnosis not present

## 2013-03-29 DIAGNOSIS — Z23 Encounter for immunization: Secondary | ICD-10-CM | POA: Diagnosis not present

## 2013-03-31 ENCOUNTER — Encounter: Payer: Self-pay | Admitting: Cardiovascular Disease

## 2013-03-31 DIAGNOSIS — I2510 Atherosclerotic heart disease of native coronary artery without angina pectoris: Secondary | ICD-10-CM | POA: Insufficient documentation

## 2013-03-31 DIAGNOSIS — E785 Hyperlipidemia, unspecified: Secondary | ICD-10-CM | POA: Insufficient documentation

## 2013-03-31 DIAGNOSIS — Z77090 Contact with and (suspected) exposure to asbestos: Secondary | ICD-10-CM | POA: Insufficient documentation

## 2013-03-31 NOTE — Progress Notes (Signed)
Patient ID: Daniel Singleton, male   DOB: Jan 16, 1941, 72 y.o.   MRN: [id number]      PATIENT PROFILE: [name] is a 72 year old gentleman who recently moved to the [place] area from [facility]. He has a history of prior CABG revascularization surgery. He is referred  through the courtesy of Dr. Alysia Penna at [facility] to establish cardiology care.   HPI: Mr. [name] is a gentleman who has a history of hypertension, hyperlipidemia, COPD, prior asbestos exposure, and diabetes mellitus. In January 2006, he underwent CABG surgery x4 admission hospital in [facility] by Dr. Eulis Manly. I do not have the specifics of his bypass grafts. He states that last year, he did undergo a stress test in [place] after he had experienced some recurrent chest pain episodes. He may have had a catheterization at that time as well he tells me that showed mild blockages. He has establish primary care with Dr. Link Snuffer.  He also has a history of obstructive sleep apnea on CPAP therapy with 3 L of oxygen. He presents to the office today for cardiology evaluation.  Mr. Graca does admit to some shortness of breath.  He is unaware of tachycardia palpitations. He does have hyperlipidemia. Apparently he had been on isosorbide mononitrate therapy but stopped taking this because of libido concerns and potential need for medications for erectile function.   Past Medical History  Diagnosis Date  . Hypertension   . Angina at rest   . Emphysema   . Diabetes   . Sleep apnea   . Hypercholesteremia     Past Surgical History  Procedure Laterality Date  . Neck surgery  2005  . Heart bypass  2006    Quad    No Known Allergies  Current Outpatient Prescriptions  Medication Sig Dispense Refill  . aspirin 81 MG tablet Take 81 mg by mouth daily.      Marland Kitchen atorvastatin (LIPITOR) 40 MG tablet Take 40 mg by mouth daily.      . Biotin 5000 MCG TABS Take 2 capsules by mouth daily.      .  Cinnamon 500 MG capsule Take 1,000 mg by mouth daily.      . clopidogrel (PLAVIX) 75 MG tablet Take 75 mg by mouth daily.      . DULoxetine (CYMBALTA) 60 MG capsule Take 60 mg by mouth daily.      . Flaxseed, Linseed, (FLAX SEED OIL) 1300 MG CAPS Take 1 capsule by mouth 2 (two) times daily.      . metFORMIN (GLUCOPHAGE) 500 MG tablet Take 500 mg by mouth daily with breakfast.      . metoprolol (LOPRESSOR) 50 MG tablet Take 50 mg by mouth 2 (two) times daily.      . niacin 500 MG tablet Take 1,000 mg by mouth every evening.      Marland Kitchen omeprazole (PRILOSEC) 20 MG capsule Take 20 mg by mouth daily.      Marland Kitchen ezetimibe (ZETIA) 10 MG tablet Take 1 tablet (10 mg total) by mouth daily.  30 tablet  11  . ranolazine (RANEXA) 500 MG 12 hr tablet Take 1 tablet (500 mg total) by mouth 2 (two) times daily.  60 tablet  6   No current facility-administered medications for this visit.    Social history is notable in that he is married to his 2 children ages 3 and 22. He smoked one pack of cigarettes per day until 2000. He does drink occasional alcohol.  He does walk  Family History  Problem Relation Age of Onset  . Heart disease Mother   . Heart attack Mother   . Cancer Sister     ROS is negative for fever chills or night sweats. He denies visual symptoms. He does have sleep apnea and has 3 L of oxygen bled into his CPAP machine. He denies recent chest pain but apparently last year for chest pain apparently had a repeat stress test done in [place] which lead to possible catheterization and this revealed mild blockages.  There is a remote degloving injury many years ago. He denies restless legs. He denies edema. He denies myalgias. Other system review is negative.  PE BP 138/76  Pulse 74  Ht 5\' 10"  (1.778 m)  Wt 202 lb 6.4 oz (91.808 kg)  BMI 29.04 kg/m2 General: Alert, oriented, no distress.  Skin: normal turgor, no rashes HEENT: Normocephalic, atraumatic. Pupils round and reactive; sclera anicteric;  Fundi no hemorrhages or exudates Nose without nasal septal hypertrophy Mouth/Parynx benign; Mallinpatti scale 3 Neck: No JVD, no carotid briuts Lungs: clear to ausculatation and percussion; no wheezing or rales Heart: RRR, s1 s2 normal 1/6 systolic murmur Abdomen: soft, nontender; no hepatosplenomehaly, BS+; abdominal aorta nontender and not dilated by palpation. Pulses 2+ Extremities: no clubbinbg cyanosis or edema, Homan's sign negative  Neurologic: grossly nonfocal Psychological: Normal affect and mood    ECG: Sinus rhythm with mild sinus arrhythmia. PR interval 182 ms. QTC interval 426 ms.  LABS:  BMET No results found for this basename: na, k, cl, co2, glucose, bun, creatinine, calcium, gfrnonaa, gfraa     Hepatic Function Panel  No results found for this basename: prot, albumin, ast, alt, alkphos, bilitot, bilidir, ibili     CBC No results found for this basename: wbc, rbc, hgb, hct, plt, mcv, mch, mchc, rdw, neutrabs, lymphsabs, monoabs, eosabs, basosabs     BNP No results found for this basename: probnp    Lipid Panel  No results found for this basename: chol, trig, hdl, cholhdl, vldl, ldlcalc     RADIOLOGY: No results found.   ASSESSMENT AND PLAN: My impression is that [name] is a 72 year old gentleman who has a history of diabetes mellitus, hyperlipidemia, obstructive sleep apnea on CPAP therapy with supplemental oxygen, as well as COPD. He has a prior tobacco history but he quit on 01/17/1999. He is status post CABG surgery x4 in January 2006. Panel he had developed chest pain last year which led to a repeat stress test and possible catheterization. He recently stopped taking his isosorbide. He is told me that he does have mild blockages persistent. In place of his self discontinued isosorbide I am starting Ranexa 500 mg twice a day. This should not have any interaction with erectile function medications. His primary physician had recently checked  his complete set of laboratory. Cholesterol was 185 triglycerides 153, HDL remains low at 31 and LDL was 123.  Hemoglobin A1c was 6.3. PSA was elevated at 10.06 which will need to be followed up. Presently, I am electing to add Ranexa 500 mg twice a day with his coronary disease particularly since he has discontinued his isosorbide. I've also suggested the addition of Zetia at 10 mg to be added to his atorvastatin 40 mg as well as niacin 1000 mg medical regimen. I am scheduling him for an echo Doppler study to reassess systolic and diastolic function as well as valve architecture. I'll try to obtain the results from [facility] in  [place] concerning his cardiac catheterizations and CABG revascularization surgery data. I will see him in the office in follow-up and plan  to do a subsequent lipoprofile to asses particle number for aggressive lipid intervention.  Lennette Bihari, MD, [facility] 03/31/2013 3:42 PM

## 2013-04-03 ENCOUNTER — Ambulatory Visit (HOSPITAL_COMMUNITY)
Admission: RE | Admit: 2013-04-03 | Discharge: 2013-04-03 | Disposition: A | Discharge status: Home / Self Care | Payer: Medicare Other | Source: Ambulatory Visit | Attending: Cardiovascular Disease | Admitting: Cardiovascular Disease

## 2013-04-03 DIAGNOSIS — Z951 Presence of aortocoronary bypass graft: Secondary | ICD-10-CM | POA: Diagnosis not present

## 2013-04-03 DIAGNOSIS — I079 Rheumatic tricuspid valve disease, unspecified: Secondary | ICD-10-CM | POA: Insufficient documentation

## 2013-04-03 DIAGNOSIS — I2510 Atherosclerotic heart disease of native coronary artery without angina pectoris: Secondary | ICD-10-CM

## 2013-04-03 DIAGNOSIS — I10 Essential (primary) hypertension: Secondary | ICD-10-CM

## 2013-04-03 NOTE — Progress Notes (Signed)
2D Echo Performed 04/03/2013    Latorya Bautch, RCS  

## 2013-04-06 ENCOUNTER — Encounter: Payer: Self-pay | Admitting: *Deleted

## 2013-04-20 DIAGNOSIS — R972 Elevated prostate specific antigen [PSA]: Secondary | ICD-10-CM | POA: Diagnosis not present

## 2013-04-20 DIAGNOSIS — N400 Enlarged prostate without lower urinary tract symptoms: Secondary | ICD-10-CM | POA: Diagnosis not present

## 2013-05-01 DIAGNOSIS — N400 Enlarged prostate without lower urinary tract symptoms: Secondary | ICD-10-CM | POA: Diagnosis not present

## 2013-05-01 DIAGNOSIS — R350 Frequency of micturition: Secondary | ICD-10-CM | POA: Diagnosis not present

## 2013-05-01 DIAGNOSIS — R351 Nocturia: Secondary | ICD-10-CM | POA: Diagnosis not present

## 2013-05-01 DIAGNOSIS — R972 Elevated prostate specific antigen [PSA]: Secondary | ICD-10-CM | POA: Diagnosis not present

## 2013-05-04 DIAGNOSIS — E785 Hyperlipidemia, unspecified: Secondary | ICD-10-CM | POA: Diagnosis not present

## 2013-05-04 DIAGNOSIS — Z79899 Other long term (current) drug therapy: Secondary | ICD-10-CM | POA: Diagnosis not present

## 2013-05-04 LAB — COMPREHENSIVE METABOLIC PANEL
ALT: 16 U/L (ref 0–53)
AST: 17 U/L (ref 0–37)
Albumin: 4 g/dL (ref 3.5–5.2)
BUN: 17 mg/dL (ref 6–23)
CO2: 29 mEq/L (ref 19–32)
Calcium: 9.1 mg/dL (ref 8.4–10.5)
Chloride: 101 mEq/L (ref 96–112)
Creat: 1 mg/dL (ref 0.50–1.35)
Potassium: 4.4 mEq/L (ref 3.5–5.3)

## 2013-05-08 LAB — NMR LIPOPROFILE WITH LIPIDS
Cholesterol, Total: 187 mg/dL (ref <200)
HDL Particle Number: 26.2 umol/L — ABNORMAL LOW (ref >=30.5)
LDL Particle Number: 2463 nmol/L — ABNORMAL HIGH (ref <1000)
LP-IR Score: 91 — ABNORMAL HIGH (ref <=45)
Large VLDL-P: 10 nmol/L — ABNORMAL HIGH (ref <=2.7)
Small LDL Particle Number: 1739 nmol/L — ABNORMAL HIGH (ref <=527)
Triglycerides: 348 mg/dL — ABNORMAL HIGH (ref <150)
VLDL Size: 56.6 nm — ABNORMAL HIGH (ref <=46.6)

## 2013-05-09 ENCOUNTER — Telehealth: Payer: Self-pay | Admitting: Cardiovascular Disease

## 2013-05-09 NOTE — Telephone Encounter (Signed)
Need to know if he can stop his  Ranexa and aspirin?  Dr Margarita Grizzle office said they had fax this over,but had not heard anything.

## 2013-05-09 NOTE — Telephone Encounter (Signed)
Message forwarded to Dr. Pierre Bali, CMA.  Paper chart requested to be placed on cart.

## 2013-05-10 NOTE — Telephone Encounter (Signed)
Duplicate message. 

## 2013-05-10 NOTE — Telephone Encounter (Signed)
Pull chart /why ranexa?

## 2013-05-11 ENCOUNTER — Telehealth: Payer: Self-pay | Admitting: *Deleted

## 2013-05-11 NOTE — Telephone Encounter (Signed)
Informed patient's wife we have received clearance from [facility], however Dr. Tresa Endo has not signed yet. I will be sure to have him to review this when he is in the office next Monday. I also informed her that the patient will need to schedule his follow up appointment with Dr. Tresa Endo in October. Call transferred to [facility] to schedule October appointment.

## 2013-05-11 NOTE — Telephone Encounter (Signed)
Informed wife I will have Dr. Tresa Endo address clearance at his next office day.

## 2013-05-15 ENCOUNTER — Telehealth: Payer: Self-pay | Admitting: Cardiovascular Disease

## 2013-05-15 NOTE — Telephone Encounter (Signed)
Need to know if he can stop his Plavix and aspirin-need to stop this 5 days priior to his biopsy. Please fax  A note with Dr Tresa Endo signature  on it-saying this is okay.Please fax [phone]  Att.Dr Margarita Grizzle.

## 2013-05-16 ENCOUNTER — Other Ambulatory Visit: Payer: Self-pay | Admitting: *Deleted

## 2013-05-16 MED ORDER — ATORVASTATIN CALCIUM 80 MG PO TABS: 40.0000 mg | ORAL_TABLET | Freq: Every day | ORAL | Status: DC

## 2013-05-16 MED ORDER — OMEGA-3-ACID ETHYL ESTERS 1 G PO CAPS: 1.0000 g | ORAL_CAPSULE | Freq: Two times a day (BID) | ORAL | Status: DC

## 2013-05-16 NOTE — Progress Notes (Signed)
Quick Note:  Lab results given to patient. lovaza and atorvastatin 80 mg sent to [facility]. ______

## 2013-05-16 NOTE — Telephone Encounter (Signed)
Signed clearance form faxed to [facility].

## 2013-05-16 NOTE — Telephone Encounter (Signed)
Message forwarded to Dr. Kelly/Wanda, CMA.  

## 2013-05-17 ENCOUNTER — Telehealth: Payer: Self-pay | Admitting: Cardiovascular Disease

## 2013-05-17 ENCOUNTER — Telehealth: Payer: Self-pay | Admitting: *Deleted

## 2013-05-17 NOTE — Telephone Encounter (Signed)
Message forwarded to Dr. Kelly/Wanda, CMA.  

## 2013-05-17 NOTE — Telephone Encounter (Signed)
Would like to know if If Mr. Bahr can come off of his blood thinner for a biopsy. Dr. Margarita Grizzle( [facility]) is waiting to hear from Dr. Tresa Endo . Please Call   Thanks

## 2013-05-17 NOTE — Telephone Encounter (Signed)
Would like to know if If Mr. Pelphrey can come off of his blood thinner for a biopsy. Dr. Woodruff( [facility]) is waiting to hear from Dr. Kelly . Please Call ° ° °Thanks  °

## 2013-05-17 NOTE — Telephone Encounter (Signed)
Left message urology clearance letter was sent over to their office yesterday.

## 2013-05-18 ENCOUNTER — Other Ambulatory Visit: Payer: Self-pay | Admitting: Urology

## 2013-05-21 ENCOUNTER — Encounter (HOSPITAL_COMMUNITY): Payer: Self-pay

## 2013-05-21 ENCOUNTER — Other Ambulatory Visit (HOSPITAL_COMMUNITY): Payer: Self-pay | Admitting: *Deleted

## 2013-05-21 ENCOUNTER — Encounter (HOSPITAL_COMMUNITY): Payer: Self-pay | Admitting: Pharmacy Technician

## 2013-05-21 ENCOUNTER — Encounter (HOSPITAL_COMMUNITY)
Admission: RE | Admit: 2013-05-21 | Discharge: 2013-05-21 | Disposition: A | Discharge status: Home / Self Care | Payer: Medicare Other | Source: Ambulatory Visit | Attending: Urology | Admitting: Urology

## 2013-05-21 DIAGNOSIS — E7800 Pure hypercholesterolemia, unspecified: Secondary | ICD-10-CM | POA: Diagnosis not present

## 2013-05-21 DIAGNOSIS — I10 Essential (primary) hypertension: Secondary | ICD-10-CM | POA: Diagnosis not present

## 2013-05-21 DIAGNOSIS — N4289 Other specified disorders of prostate: Secondary | ICD-10-CM | POA: Diagnosis not present

## 2013-05-21 DIAGNOSIS — G4730 Sleep apnea, unspecified: Secondary | ICD-10-CM | POA: Diagnosis not present

## 2013-05-21 DIAGNOSIS — K219 Gastro-esophageal reflux disease without esophagitis: Secondary | ICD-10-CM | POA: Diagnosis not present

## 2013-05-21 DIAGNOSIS — E119 Type 2 diabetes mellitus without complications: Secondary | ICD-10-CM | POA: Diagnosis not present

## 2013-05-21 DIAGNOSIS — C61 Malignant neoplasm of prostate: Secondary | ICD-10-CM | POA: Diagnosis not present

## 2013-05-21 DIAGNOSIS — I2510 Atherosclerotic heart disease of native coronary artery without angina pectoris: Secondary | ICD-10-CM | POA: Diagnosis not present

## 2013-05-21 DIAGNOSIS — N419 Inflammatory disease of prostate, unspecified: Secondary | ICD-10-CM | POA: Diagnosis not present

## 2013-05-21 HISTORY — DX: Unspecified osteoarthritis, unspecified site: M19.90

## 2013-05-21 HISTORY — DX: Other specified postprocedural states: R11.2

## 2013-05-21 HISTORY — DX: Elevated prostate specific antigen (PSA): R97.20

## 2013-05-21 HISTORY — DX: Other specified postprocedural states: Z98.890

## 2013-05-21 HISTORY — DX: Atherosclerotic heart disease of native coronary artery without angina pectoris: I25.10

## 2013-05-21 HISTORY — DX: Gastro-esophageal reflux disease without esophagitis: K21.9

## 2013-05-21 LAB — BASIC METABOLIC PANEL
BUN: 12 mg/dL (ref 6–23)
CO2: 30 mEq/L (ref 19–32)
Calcium: 9.5 mg/dL (ref 8.4–10.5)
Chloride: 99 mEq/L (ref 96–112)
Glucose, Bld: 145 mg/dL — ABNORMAL HIGH (ref 70–99)
Potassium: 4 mEq/L (ref 3.5–5.1)
Sodium: 138 mEq/L (ref 135–145)

## 2013-05-21 LAB — CBC
HCT: 42.7 % (ref 39.0–52.0)
Hemoglobin: 14.9 g/dL (ref 13.0–17.0)
MCH: 30.5 pg (ref 26.0–34.0)
MCHC: 34.9 g/dL (ref 30.0–36.0)
MCV: 87.3 fL (ref 78.0–100.0)
Platelets: 145 10*3/uL — ABNORMAL LOW (ref 150–400)
RBC: 4.89 MIL/uL (ref 4.22–5.81)
RDW: 14.3 % (ref 11.5–15.5)

## 2013-05-21 NOTE — Patient Instructions (Addendum)
Yusuf L Guterrez  05/21/2013                           YOUR PROCEDURE IS SCHEDULED ON:  05/23/13               PLEASE REPORT TO SHORT STAY CENTER AT : 11:45 am               CALL THIS NUMBER IF ANY PROBLEMS THE DAY OF SURGERY :               [phone]                      REMEMBER:   Do not eat food  AFTER MIDNIGHT  May have clear liquids UNTIL 6 HOURS BEFORE SURGERY (8:15 am)  Clear liquids include soda, tea, black coffee, apple or grape juice, broth.  Take these medicines the morning of surgery with A SIP OF WATER:  CYMBALTA / LIPITOR / METOPROLOL / OMEPRAZOLE / RANEXA   Do not wear jewelry, make-up   Do not wear lotions, powders, or perfumes.   Do not shave legs or underarms 12 hrs. before surgery (men may shave face)  Do not bring valuables to the hospital.  Contacts, dentures or bridgework may not be worn into surgery.  Leave suitcase in the car. After surgery it may be brought to your room.  For patients admitted to the hospital more than one night, checkout time is 11:00                          The day of discharge.   Patients discharged the day of surgery will not be allowed to drive home                             If going home same day of surgery, must have someone stay with you first                           24 hrs at home and arrange for some one to drive you home from hospital.    Special Instructions:   Please read over the following fact sheets that you were               1. [place] PREPARING FOR SURGERY SHEET              2. BRING C PAP MASK AND TUBING TO HOSPITAL              3. BRING INHALER TO HOSPITAL                                                X_____________________________________________________________________        Failure to follow these instructions may result in cancellation of your surgery

## 2013-05-23 ENCOUNTER — Encounter (HOSPITAL_COMMUNITY)
Admission: RE | Disposition: A | Discharge status: Home / Self Care | Payer: Self-pay | Source: Ambulatory Visit | Attending: Urology

## 2013-05-23 ENCOUNTER — Ambulatory Visit (HOSPITAL_COMMUNITY)
Admission: RE | Admit: 2013-05-23 | Discharge: 2013-05-23 | Disposition: A | Discharge status: Home / Self Care | Payer: Medicare Other | Source: Ambulatory Visit | Attending: Urology | Admitting: Urology

## 2013-05-23 ENCOUNTER — Encounter (HOSPITAL_COMMUNITY): Payer: Self-pay | Admitting: *Deleted

## 2013-05-23 ENCOUNTER — Encounter (HOSPITAL_COMMUNITY): Payer: Self-pay | Admitting: Anesthesiology

## 2013-05-23 ENCOUNTER — Ambulatory Visit (HOSPITAL_COMMUNITY): Payer: Medicare Other | Admitting: Anesthesiology

## 2013-05-23 DIAGNOSIS — N419 Inflammatory disease of prostate, unspecified: Secondary | ICD-10-CM | POA: Insufficient documentation

## 2013-05-23 DIAGNOSIS — E7800 Pure hypercholesterolemia, unspecified: Secondary | ICD-10-CM | POA: Insufficient documentation

## 2013-05-23 DIAGNOSIS — N4289 Other specified disorders of prostate: Secondary | ICD-10-CM | POA: Insufficient documentation

## 2013-05-23 DIAGNOSIS — R972 Elevated prostate specific antigen [PSA]: Secondary | ICD-10-CM | POA: Diagnosis not present

## 2013-05-23 DIAGNOSIS — I10 Essential (primary) hypertension: Secondary | ICD-10-CM | POA: Diagnosis not present

## 2013-05-23 DIAGNOSIS — K219 Gastro-esophageal reflux disease without esophagitis: Secondary | ICD-10-CM | POA: Insufficient documentation

## 2013-05-23 DIAGNOSIS — I2510 Atherosclerotic heart disease of native coronary artery without angina pectoris: Secondary | ICD-10-CM | POA: Diagnosis not present

## 2013-05-23 DIAGNOSIS — G4730 Sleep apnea, unspecified: Secondary | ICD-10-CM | POA: Insufficient documentation

## 2013-05-23 DIAGNOSIS — C61 Malignant neoplasm of prostate: Secondary | ICD-10-CM

## 2013-05-23 DIAGNOSIS — E119 Type 2 diabetes mellitus without complications: Secondary | ICD-10-CM | POA: Insufficient documentation

## 2013-05-23 HISTORY — DX: Malignant neoplasm of prostate: C61

## 2013-05-23 HISTORY — PX: PROSTATE BIOPSY: SHX241

## 2013-05-23 LAB — GLUCOSE, CAPILLARY

## 2013-05-23 LAB — PLATELET COUNT: Platelets: 177 K/uL (ref 150–400)

## 2013-05-23 SURGERY — BIOPSY, PROSTATE, RECTAL APPROACH, WITH US GUIDANCE
Anesthesia: General | Wound class: Clean

## 2013-05-23 MED ORDER — ONDANSETRON HCL 4 MG/2ML IJ SOLN
INTRAMUSCULAR | Status: DC | PRN
  Administered 2013-05-23: 4 mg via INTRAVENOUS

## 2013-05-23 MED ORDER — LACTATED RINGERS IV SOLN
INTRAVENOUS | Status: DC | PRN
  Administered 2013-05-23: 14:00:00 via INTRAVENOUS

## 2013-05-23 MED ORDER — FENTANYL CITRATE 0.05 MG/ML IJ SOLN
INTRAMUSCULAR | Status: DC | PRN
  Administered 2013-05-23 (×2): 50 ug via INTRAVENOUS

## 2013-05-23 MED ORDER — OXYCODONE HCL 5 MG PO TABS: 5.0000 mg | ORAL_TABLET | Freq: Once | ORAL | Status: DC | PRN

## 2013-05-23 MED ORDER — BUPIVACAINE HCL (PF) 0.25 % IJ SOLN
INTRAMUSCULAR | Status: AC
  Filled 2013-05-23: qty 30

## 2013-05-23 MED ORDER — EPHEDRINE SULFATE 50 MG/ML IJ SOLN
INTRAMUSCULAR | Status: DC | PRN
  Administered 2013-05-23 (×2): 10 mg via INTRAVENOUS

## 2013-05-23 MED ORDER — DEXTROSE 5 % IV SOLN
2.0000 g | INTRAVENOUS | Status: AC
  Administered 2013-05-23: 2 g via INTRAVENOUS
  Filled 2013-05-23: qty 2

## 2013-05-23 MED ORDER — BUPIVACAINE HCL (PF) 0.25 % IJ SOLN
INTRAMUSCULAR | Status: DC | PRN
  Administered 2013-05-23: 10 mL

## 2013-05-23 MED ORDER — PROPOFOL 10 MG/ML IV BOLUS
INTRAVENOUS | Status: DC | PRN
  Administered 2013-05-23: 150 mg via INTRAVENOUS

## 2013-05-23 MED ORDER — PROMETHAZINE HCL 25 MG/ML IJ SOLN: 6.2500 mg | INTRAMUSCULAR | Status: DC | PRN

## 2013-05-23 MED ORDER — OXYCODONE HCL 5 MG/5ML PO SOLN
5.0000 mg | Freq: Once | ORAL | Status: DC | PRN
  Filled 2013-05-23: qty 5

## 2013-05-23 MED ORDER — HYDROMORPHONE HCL PF 1 MG/ML IJ SOLN
0.2500 mg | INTRAMUSCULAR | Status: DC | PRN
Start: 2013-05-23 — End: 2013-05-23

## 2013-05-23 MED ORDER — MEPERIDINE HCL 50 MG/ML IJ SOLN: 6.2500 mg | INTRAMUSCULAR | Status: DC | PRN

## 2013-05-23 MED ORDER — LACTATED RINGERS IV SOLN
INTRAVENOUS | Status: DC
Start: 2013-05-23 — End: 2013-05-23
  Administered 2013-05-23: 1000 mL via INTRAVENOUS

## 2013-05-23 MED ORDER — LIDOCAINE HCL 1 % IJ SOLN
INTRAMUSCULAR | Status: AC
  Filled 2013-05-23: qty 20

## 2013-05-23 SURGICAL SUPPLY — 3 items
CLOTH BEACON ORANGE TIMEOUT ST (SAFETY) IMPLANT
SYR CONTROL 10ML LL (SYRINGE) IMPLANT
UNDERPAD 30X30 INCONTINENT (UNDERPADS AND DIAPERS) ×2 IMPLANT

## 2013-05-23 NOTE — Anesthesia Preprocedure Evaluation (Addendum)
Anesthesia Evaluation  Patient identified by MRN, date of birth, ID band Patient awake    Reviewed: Allergy & Precautions, H&P , NPO status , Patient's Chart, lab work & pertinent test results, reviewed documented beta blocker date and time   History of Anesthesia Complications (+) PONV  Airway Mallampati: II TM Distance: >3 FB Neck ROM: Full    Dental  (+) Dental Advisory Given and Teeth Intact   Pulmonary sleep apnea , COPD breath sounds clear to auscultation        Cardiovascular Exercise Tolerance: Good hypertension, Pt. on medications and Pt. on home beta blockers + angina with exertion + CAD and + Cardiac Stents Rhythm:Regular Rate:Normal  Echo 03/2013 - Left ventricle: The cavity size was normal. Wall thickness  was normal. Systolic function was normal. The estimated  ejection fraction was in the range of 55% to 60%. Wall  motion was normal; there were no regional wall motion  abnormalities. Doppler parameters are consistent with  abnormal left ventricular relaxation (grade 1 diastolic  dysfunction). The E/e' ratio is >10, suggesting elevated  LV filling pressure. - Left atrium: LA Volume/ BSA = 44ml/m2 The atrium was   normal in size. - Atrial septum: No defect or patent foramen ovale was   identified. - Tricuspid valve: Mild regurgitation. - Pulmonary arteries: PA peak pressure: 27mm Hg (S). - Inferior vena cava: The vessel was normal in size; the   respirophasic diameter changes were in the normal range (=  50%); findings are consistent with normal central venous  pressure.    Neuro/Psych negative neurological ROS  negative psych ROS   GI/Hepatic Neg liver ROS, GERD-  Medicated,  Endo/Other  diabetes, Type 2, Oral Hypoglycemic Agents  Renal/GU negative Renal ROS     Musculoskeletal negative musculoskeletal ROS (+)   Abdominal   Peds  Hematology negative hematology ROS (+)   Anesthesia Other Findings   Reproductive/Obstetrics negative OB ROS                         Anesthesia Physical Anesthesia Plan  ASA: III  Anesthesia Plan: General   Post-op Pain Management:    Induction: Intravenous  Airway Management Planned: LMA  Additional Equipment:   Intra-op Plan:   Post-operative Plan: Extubation in OR  Informed Consent: I have reviewed the patients History and Physical, chart, labs and discussed the procedure including the risks, benefits and alternatives for the proposed anesthesia with the patient or authorized representative who has indicated his/her understanding and acceptance.   Dental advisory given  Plan Discussed with: CRNA  Anesthesia Plan Comments:        Anesthesia Quick Evaluation

## 2013-05-23 NOTE — Op Note (Signed)
DATE OF PROCEDURE: 05/23/13    OPERATIVE REPORT   SURGEON: Natalia Leatherwood, MD  ASSISTANT: None.   PREOPERATIVE DIAGNOSIS: Elevated PSA POSTOPERATIVE DIAGNOSIS: Same.   PROCEDURE PERFORMED:  1. Prostate biopsy.  2. Transrectal ultrasound imaging with interpretation.  3. Bilateral prostatic nerve block.   ANESTHETIC: General anesthesia (per patients request) and local nerve block.   LOCAL MEDICATION: 0.25% Marcaine without epinephrine, 10 mL.  DRAINS: None.  COMPLICATIONS: None.   FINDINGS:  Prostate measurement: Volume: 31.45 cc.  Length: 4.48 cm. Height: 2.72 cm. Width: 4.93 cm.  There were calcifications in bilateral mid portion of the prostate. No hypoechoic areas.  Normal seminal vesicles.  Median lobe present: Negative.   COMPLICATIONS: None.   HISTORY OF PRESENT ILLNESS:  Patient presents with elevated PSA to 8 with an 8%free PSA.  PROCEDURE: After informed consent was obtained, the patient was taken  to the operating room where he was placed in the supine position.  General anesthesia was induced and he was placed on his side.  SCDs were in place and turned on before induction of the monitored  anesthesia care. IV antibiotics were infused. A time-out was then  performed in which the correct patient, surgical site, and procedure  were identified and agreed upon by the team.   I then performed a digital rectal examination and estimated his prostate to be 35-40 g in size. There were no nodules or induration noted.  Next, the rectal ultrasound probe was advanced through the anus into the rectum. The  prostate was well visualized. I scanned the entire prostate and  performed measurements (results listed above).  Next, I performed a prostatic nerve block bilaterally by visualizing the base of the prostate with the junction of the seminal  vesicle on the right side and injecting 4 mL of 0.25% Marcaine without  epinephrine. Next, I advanced the probe to the apex of  the right side  and injected 1 mL. Attention was turned to the left side at the junction of the base of the prostate and the seminal vesicle, and 4 mL were  injected here as well as 1 mL at the left apex giving a total of 10 mL  of injection. Each time before injecting, it did pull back on the  syringe to ensure I was not injecting into the vasculature.   Following this, the standard sextant biopsies were carried out by obtaining six  biopsies on each side at the lateral apex, midportion and base as well  as the medial apex, midportion, and base, bilaterally.    After this was done, there was noted to be no rectal bleeding or any other  complications. The probe was removed. This completed the procedure.  He was placed in back in a supine position. Anesthesia was reversed.  He was taken to the PACU in a stable condition.    All counts were correct at the end of the case.

## 2013-05-23 NOTE — Preoperative (Signed)
Beta Blockers   Reason not to administer Beta Blockers:Not Applicable 

## 2013-05-23 NOTE — H&P (Signed)
Urology History and Physical Exam  CC: Elevated PSA  HPI:  72 year old male presents today for elevated PSA.  This was discovered in July with PSA was 10.5 with 8% free.  He repeated his PSA 04/20/13 and his PSA remained elevated at 8 and continued to have 8% free.  We discussed management options and he has elected to proceed with transrectal ultrasound imaging of the prostate, bilateral prostate biopsy, and bilateral prosthetic nerve block.  During our office visits, he has not been able to tolerate a rectal exam very well.  Because of this he has elected to be scheduled  in the operating room.  We have discussed the risks, benefits, alternatives, and likelihood of achieving goals.  This is not associated with prostate nodules.  He was given a prescription of Levaquin to begin the day before his prostate biopsy today.  He was also cleared by his cardiologist, Dr. Nicki Guadalajara, for the procedure and to hold his Plavix prior to the procedure.  UA 05/01/13 was negative for signs of infection. Pre-op labs show platelets a little low at 145, but I don't feel this should be a problem. This was discussed with the patient. Repeat lab showed platelets were 177.  PMH: Past Medical History  Diagnosis Date  . Hypertension   . Emphysema   . Diabetes   . Hypercholesteremia   . PONV (postoperative nausea and vomiting)   . Angina at rest   . Coronary artery disease     recently started seeing Dr. Tresa Endo   . Arthritis   . GERD (gastroesophageal reflux disease)   . Elevated PSA   . Sleep apnea     c pap with oxygen at 2.5 L    PSH: Past Surgical History  Procedure Laterality Date  . Heart bypass  2006    Quad  . Degloving rt leg injury  2000    had skin grafts  . Hernia repair  2008  . Back surgery  2006    discectomy lumbar  . Tonsillectomy      Allergies: No Known Allergies  Medications: No prescriptions prior to admission     Social History: History   Social History  . Marital  Status: Married    Spouse Name: N/A    Number of Children: N/A  . Years of Education: N/A   Occupational History  . Retired     Social History Main Topics  . Smoking status: Former Smoker -- 1.00 packs/day for 40 years    Types: Cigarettes    Quit date: 01/17/1999  . Smokeless tobacco: Never Used  . Alcohol Use: Yes     Comment: 1 beer weekly  . Drug Use: No  . Sexual Activity: Not on file   Other Topics Concern  . Not on file   Social History Narrative  . No narrative on file    Family History: Family History  Problem Relation Age of Onset  . Heart disease Mother   . Heart attack Mother   . Cancer Sister     Review of Systems: Positive: None. Negative: Fever, chest pain, or SOB.  A further 10 point review of systems was negative except what is listed in the HPI.  Physical Exam: Filed Vitals:   05/23/13 1142  BP: 130/79  Pulse: 68  Temp: 97.4 F (36.3 C)  Resp: 18    General: No acute distress.  Awake. Head:  Normocephalic.  Atraumatic. ENT:  EOMI.  Mucous membranes moist Neck:  Supple.  No lymphadenopathy. CV:  S1 present. S2 present. Regular rate. Pulmonary: Equal effort bilaterally.  Clear to auscultation bilaterally. Abdomen: Soft.  Non- tender to palpation. Skin:  Normal turgor.  No visible rash. Extremity: No gross deformity of bilateral upper extremities.  No gross deformity of    bilateral lower extremities. Neurologic: Alert. Appropriate mood.  Rectal:   40 g in size. Negative prostate nodules or masses.  Studies:  Recent Labs     05/21/13  1525  HGB  14.9  WBC  6.5  PLT  145*    Recent Labs     05/21/13  1450  NA  138  K  4.0  CL  99  CO2  30  BUN  12  CREATININE  0.84  CALCIUM  9.5  GFRNONAA  87*  GFRAA  >90     No results found for this basename: PT, INR, APTT,  in the last 72 hours   No components found with this basename: ABG,     Assessment:  Elevated PSA.  Plan: To OR for  transrectal ultrasound imaging of  the prostate, bilateral prostate biopsy, and bilateral prosthetic nerve block.

## 2013-05-23 NOTE — Transfer of Care (Signed)
Immediate Anesthesia Transfer of Care Note  Patient: Daniel Singleton  Procedure(s) Performed: Procedure(s) with comments: BIOPSY TRANSRECTAL ULTRASONIC PROSTATE (TUBP) (N/A) - prostate nerve block  Patient Location: PACU  Anesthesia Type:General  Level of Consciousness: awake, alert , oriented and patient cooperative  Airway & Oxygen Therapy: Patient Spontanous Breathing and Patient connected to face mask oxygen  Post-op Assessment: Report given to PACU RN and Post -op Vital signs reviewed and stable  Post vital signs: Reviewed and stable  Complications: No apparent anesthesia complications

## 2013-05-24 ENCOUNTER — Encounter (HOSPITAL_COMMUNITY): Payer: Self-pay | Admitting: Urology

## 2013-05-24 NOTE — Anesthesia Postprocedure Evaluation (Signed)
Anesthesia Post Note  Patient: Daniel Singleton  Procedure(s) Performed: Procedure(s) (LRB): BIOPSY TRANSRECTAL ULTRASONIC PROSTATE (TUBP) (N/A)  Anesthesia type: General  Patient location: PACU  Post pain: Pain level controlled  Post assessment: Post-op Vital signs reviewed  Last Vitals: BP 131/81  Pulse 80  Temp(Src) 36.1 C (Oral)  Resp 16  SpO2 97%  Post vital signs: Reviewed  Level of consciousness: sedated  Complications: No apparent anesthesia complications

## 2013-05-25 ENCOUNTER — Encounter: Payer: Self-pay | Admitting: Cardiovascular Disease

## 2013-05-25 ENCOUNTER — Ambulatory Visit (INDEPENDENT_AMBULATORY_CARE_PROVIDER_SITE_OTHER): Payer: Medicare Other

## 2013-05-25 ENCOUNTER — Telehealth: Payer: Self-pay | Admitting: Internal Medicine

## 2013-05-25 ENCOUNTER — Ambulatory Visit (INDEPENDENT_AMBULATORY_CARE_PROVIDER_SITE_OTHER): Payer: Medicare Other | Admitting: Cardiovascular Disease

## 2013-05-25 VITALS — BP 110/76 | HR 60 | Ht 71.0 in | Wt 203.9 lb

## 2013-05-25 DIAGNOSIS — E785 Hyperlipidemia, unspecified: Secondary | ICD-10-CM | POA: Diagnosis not present

## 2013-05-25 DIAGNOSIS — G4733 Obstructive sleep apnea (adult) (pediatric): Secondary | ICD-10-CM

## 2013-05-25 DIAGNOSIS — Z23 Encounter for immunization: Secondary | ICD-10-CM | POA: Diagnosis not present

## 2013-05-25 DIAGNOSIS — C61 Malignant neoplasm of prostate: Secondary | ICD-10-CM

## 2013-05-25 DIAGNOSIS — J438 Other emphysema: Secondary | ICD-10-CM | POA: Diagnosis not present

## 2013-05-25 DIAGNOSIS — J439 Emphysema, unspecified: Secondary | ICD-10-CM

## 2013-05-25 DIAGNOSIS — I2510 Atherosclerotic heart disease of native coronary artery without angina pectoris: Secondary | ICD-10-CM

## 2013-05-25 MED ORDER — ROSUVASTATIN CALCIUM 20 MG PO TABS: 20.0000 mg | ORAL_TABLET | Freq: Every day | ORAL | Status: DC

## 2013-05-25 MED ORDER — NIACIN ER (ANTIHYPERLIPIDEMIC) 1000 MG PO TBCR: 1000.0000 mg | EXTENDED_RELEASE_TABLET | Freq: Every day | ORAL | Status: DC

## 2013-05-25 NOTE — Patient Instructions (Addendum)
Your physician has recommended you make the following change in your medication: start crestor 20 mg and niaspan 1000 mg. These have already been sent to your pharmacy.  Your physician recommends that you return for lab work in: 2 MONTHS FASTING.  Your physician recommends that you schedule a follow-up appointment in:3 MONTHS

## 2013-05-25 NOTE — Telephone Encounter (Signed)
lmtcb x1 

## 2013-05-25 NOTE — Progress Notes (Signed)
Patient ID: Daniel Singleton, male   DOB: [birth year], 72 y.o.   MRN: [id number]       HPI:  [name] is a 72 year old gentleman who I recently saw on 03/31/2013 to establish cardiology care with me through the referral of Dr. Alysia Penna.   [name] is a gentleman who has a history of hypertension, hyperlipidemia, COPD, prior asbestos exposure, and diabetes mellitus. In January 2006, he underwent CABG surgery x4  in [facility] by Dr. Eulis Manly. I do not have the specifics of his bypass grafts. He states that last year, he did undergo a stress test in [place] after he had experienced some recurrent chest pain episodes. A catheterization showed mild blockages.  He also has a history of obstructive sleep apnea on CPAP therapy with 3 L of oxygen.  [name] does admit to some shortness of breath.  He is unaware of tachycardia palpitations. He does have hyperlipidemia. Apparently he had been on isosorbide mononitrate therapy but stopped taking this because of libido concerns and potential need for medications for erectile function.  He did undergo an echo Doppler study which was done on 04/03/2013 which showed an ejection fraction of 55-60%. Mild tricuspid regurgitation. He did have grade 1 diastolic dysfunction. When I saw him, I recommended that we perform advanced lipid testing with NMR lipoprotein of since he has had low HDL levels and her recent cholesterol was 185 triglycerides 153 HDL 31 and LDL 123. At that time, I also started him on Ranexa 500 mg twice a day since he had discontinued his isosorbide. I added Zetia 10 mg to his atorvastatin and recommended that he undergo a followup with NMR lipoprotein assessment.  His NMR study was markedly abnormal. Specifically, his total cholesterol was 187 triglycerides 348 HDL 32 LDL cholesterol 85. However, he had markedly increased goal LDL particles at 1739 status LDL particle number was markedly increased at 2463. The patient tells  me he did develop some myalgias on Lipitor and therefore for approximately the last 3-4 weeks completely discontinued a torus that in therapy.  He also tells me that last week he had a prostate biopsy. He was just notified by his urologist that this was positive for cancer. He'll be seeing the urologist and followup evaluation to discuss a treatment and further diagnostic strategy.   Past Medical History  Diagnosis Date  . Hypertension   . Emphysema   . Diabetes   . Hypercholesteremia   . PONV (postoperative nausea and vomiting)   . Angina at rest   . Coronary artery disease     recently started seeing Dr. Tresa Endo   . Arthritis   . GERD (gastroesophageal reflux disease)   . Elevated PSA   . Sleep apnea     c pap with oxygen at 2.5 L    Past Surgical History  Procedure Laterality Date  . Heart bypass  2006    Quad  . Degloving rt leg injury  2000    had skin grafts  . Hernia repair  2008  . Back surgery  2006    discectomy lumbar  . Tonsillectomy    . Prostate biopsy N/A 05/23/2013    Procedure: BIOPSY TRANSRECTAL ULTRASONIC PROSTATE (TUBP);  Surgeon: Milford Cage, MD;  Location: [facility];  Service: Urology;  Laterality: N/A;  prostate nerve block    No Known Allergies  Current Outpatient Prescriptions  Medication Sig Dispense Refill  . albuterol (PROVENTIL HFA;VENTOLIN HFA) 108 (90 BASE)  MCG/ACT inhaler Inhale 2 puffs into the lungs every 6 (six) hours as needed for wheezing.      Marland Kitchen atorvastatin (LIPITOR) 80 MG tablet Take 80 mg by mouth daily.      . Biotin 5000 MCG TABS Take 2 capsules by mouth daily.      . clopidogrel (PLAVIX) 75 MG tablet Take 1 tablet by mouth daily.      . DULoxetine (CYMBALTA) 60 MG capsule Take 60 mg by mouth daily.      Marland Kitchen ezetimibe (ZETIA) 10 MG tablet Take 10 mg by mouth every morning.      . metoprolol (LOPRESSOR) 50 MG tablet Take 50 mg by mouth 2 (two) times daily.      . niacin 500 MG tablet Take 500 mg by mouth every evening.       .  nitroGLYCERIN (NITROSTAT) 0.3 MG SL tablet Place 0.3 mg under the tongue every 5 (five) minutes as needed for chest pain.      Marland Kitchen omega-3 acid ethyl esters (LOVAZA) 1 G capsule       . omeprazole (PRILOSEC) 20 MG capsule Take 20 mg by mouth every other day.       . ranolazine (RANEXA) 500 MG 12 hr tablet Take 1 tablet (500 mg total) by mouth 2 (two) times daily.  60 tablet  6  . niacin (NIASPAN) 1000 MG CR tablet Take 1 tablet (1,000 mg total) by mouth at bedtime.  30 tablet  6  . rosuvastatin (CRESTOR) 20 MG tablet Take 1 tablet (20 mg total) by mouth daily.  30 tablet  6   No current facility-administered medications for this visit.    Social history is notable in that he is married to his 2 children ages 70 and 32. He smoked one pack of cigarettes per day until 2000. He does drink occasional alcohol. He does walk  Family History  Problem Relation Age of Onset  . Heart disease Mother   . Heart attack Mother   . Cancer Sister     ROS is negative for fever chills or night sweats. He denies visual symptoms. He does have sleep apnea and has 3 L of oxygen bled into his CPAP machine. He denies recent chest pain but apparently last year for chest pain apparently had a repeat stress test done in [place] which lead to catheterization and this revealed mild blockages.  There is a remote degloving injury many years ago. He denies restless legs. He denies edema. He denies myalgias. He is unaware of any hematuria. He denies change in bowel or bladder habits. He denies rash. Other system review is negative.  PE BP 110/76  Pulse 60  Ht 5\' 11"  (1.803 m)  Wt 203 lb 14.4 oz (92.488 kg)  BMI 28.45 kg/m2 General: Alert, oriented, no distress.  Skin: normal turgor, no rashes HEENT: Normocephalic, atraumatic. Pupils round and reactive; sclera anicteric; Fundi no hemorrhages or exudates Nose without nasal septal hypertrophy Mouth/Parynx benign; Mallinpatti scale 3 Neck: No JVD, no carotid briuts Lungs:  clear to ausculatation and percussion; no wheezing or rales Heart: RRR, s1 s2 normal 1/6 systolic murmur Abdomen: soft, nontender; no hepatosplenomehaly, BS+; abdominal aorta nontender and not dilated by palpation. Pulses 2+ Extremities: no clubbinbg cyanosis or edema, Homan's sign negative  Neurologic: grossly nonfocal Psychological: Normal affect and mood    ECG: Sinus rhythm with mild sinus arrhythmia at 60 beats per minute. PR interval 172 ms. QTC interval 384 ms.  LABS:  BMET  Component Value Date/Time   NA 138 05/21/2013 1450     Hepatic Function Panel     Component Value Date/Time   PROT 6.4 05/04/2013 1624     CBC    Component Value Date/Time   WBC 6.5 05/21/2013 1525     BNP No results found for this basename: probnp    Lipid Panel  No results found for this basename: chol,  trig,  hdl,  cholhdl,  vldl,  ldlcalc     RADIOLOGY: No results found.   ASSESSMENT AND PLAN: My impression is that [name] is a 72 year old gentleman who has a history of diabetes mellitus, hyperlipidemia, obstructive sleep apnea on CPAP therapy with supplemental oxygen, as well as COPD. He has a prior tobacco history but he quit on 01/17/1999. He is status post CABG surgery x4 in January 2006 and last year he didn't undergo a prior cardiac catheterization which did demonstrate some mild blockages. He has felt improved since taking ranolazine 500 mg twice a day. I reviewed his NMR profile with him in detail. He has markedly abnormal number of small LDL particles contributing to his significant elevation of LDL particles number. Presently, I am resuming statin therapy with Crestor 20 mg. And further titrating his niacin back to 1000 mg. In the 3 months he will undergo a repeat comprehensive metabolic panel as well as NMR profile and I will see him back in the office for followup evaluation. He will be seeing the urologist next week for further evaluation and discussion of  diagnostic and treatment options for his recently diagnosed prostate CA.   Lennette Bihari, MD, [facility] 05/25/2013 1:35 PM

## 2013-05-28 ENCOUNTER — Encounter: Payer: Self-pay | Admitting: Cardiovascular Disease

## 2013-05-28 MED ORDER — ALBUTEROL SULFATE HFA 108 (90 BASE) MCG/ACT IN AERS: 2.0000 | INHALATION_SPRAY | Freq: Four times a day (QID) | RESPIRATORY_TRACT | Status: DC | PRN

## 2013-05-28 NOTE — Telephone Encounter (Signed)
I spoke with the pt spouse and she states that she just wanted to let CY know that the pt was diagnosed with prostate cancer and they have an appt with oncology to go over options on 10-14. They also needed refill on albuterol inhaler, which I have sent. I advised I will send as an FYI. Carron Curie, CMA

## 2013-05-28 NOTE — Telephone Encounter (Signed)
Pt's spouse returned triage's call.  Holly D Pryor ° °

## 2013-06-01 ENCOUNTER — Telehealth: Payer: Self-pay | Admitting: Internal Medicine

## 2013-06-01 DIAGNOSIS — C61 Malignant neoplasm of prostate: Secondary | ICD-10-CM | POA: Diagnosis not present

## 2013-06-01 NOTE — Telephone Encounter (Signed)
Called and spoke with pts wife and she stated that she wanted to make sure that neither one of them had missed an appt.  She stated that with all the OV and labs that the pt has had recently and the dx of prostate cancer they didn't know if they had missed any appts.  Nothing further is needed.

## 2013-06-04 DIAGNOSIS — C61 Malignant neoplasm of prostate: Secondary | ICD-10-CM | POA: Diagnosis not present

## 2013-06-04 DIAGNOSIS — E785 Hyperlipidemia, unspecified: Secondary | ICD-10-CM | POA: Diagnosis not present

## 2013-06-04 DIAGNOSIS — E1169 Type 2 diabetes mellitus with other specified complication: Secondary | ICD-10-CM | POA: Diagnosis not present

## 2013-06-04 DIAGNOSIS — F528 Other sexual dysfunction not due to a substance or known physiological condition: Secondary | ICD-10-CM | POA: Diagnosis not present

## 2013-06-04 DIAGNOSIS — J449 Chronic obstructive pulmonary disease, unspecified: Secondary | ICD-10-CM | POA: Diagnosis not present

## 2013-06-04 DIAGNOSIS — Z6829 Body mass index (BMI) 29.0-29.9, adult: Secondary | ICD-10-CM | POA: Diagnosis not present

## 2013-06-04 DIAGNOSIS — F411 Generalized anxiety disorder: Secondary | ICD-10-CM | POA: Diagnosis not present

## 2013-06-04 DIAGNOSIS — I2510 Atherosclerotic heart disease of native coronary artery without angina pectoris: Secondary | ICD-10-CM | POA: Diagnosis not present

## 2013-06-05 DIAGNOSIS — C61 Malignant neoplasm of prostate: Secondary | ICD-10-CM | POA: Diagnosis not present

## 2013-06-19 ENCOUNTER — Encounter: Payer: Self-pay | Admitting: Radiation Oncology

## 2013-06-19 NOTE — Progress Notes (Signed)
GU Location of Tumor / Histology: prostate  If Prostate Cancer, Gleason Score is (3 + 4) and PSA is (8.02 on 04/20/13)  Patient presented 2 months ago with signs/symptoms of: elevated PSA  Biopsies of prostate (if applicable) revealed: adenocarcinoma 4/12 cores, Gleason 7, volume 31 cc  Past/Anticipated interventions by urology, if any: none  Past/Anticipated interventions by medical oncology, if any: none  Weight changes, if any: no  Bowel/Bladder complaints, if any:    Nausea/Vomiting, if any: no  Pain issues, if any:  no  SAFETY ISSUES:  Prior radiation? no  Pacemaker/ICD? no  Possible current pregnancy? na  Is the patient on methotrexate? no  Current Complaints / other details:  Married, retired, 1 son, 1 daughter . Interested in brachytherapy

## 2013-06-20 ENCOUNTER — Ambulatory Visit
Admission: RE | Admit: 2013-06-20 | Discharge: 2013-06-20 | Disposition: A | Discharge status: Home / Self Care | Payer: Medicare Other | Source: Ambulatory Visit | Attending: Radiation Oncology | Admitting: Radiation Oncology

## 2013-06-20 VITALS — BP 123/75 | HR 76 | Temp 98.1°F | Ht 71.0 in | Wt 206.2 lb

## 2013-06-20 DIAGNOSIS — Z87891 Personal history of nicotine dependence: Secondary | ICD-10-CM | POA: Diagnosis not present

## 2013-06-20 DIAGNOSIS — J438 Other emphysema: Secondary | ICD-10-CM | POA: Diagnosis not present

## 2013-06-20 DIAGNOSIS — K219 Gastro-esophageal reflux disease without esophagitis: Secondary | ICD-10-CM | POA: Insufficient documentation

## 2013-06-20 DIAGNOSIS — I2510 Atherosclerotic heart disease of native coronary artery without angina pectoris: Secondary | ICD-10-CM | POA: Diagnosis not present

## 2013-06-20 DIAGNOSIS — C61 Malignant neoplasm of prostate: Secondary | ICD-10-CM | POA: Diagnosis not present

## 2013-06-20 DIAGNOSIS — E119 Type 2 diabetes mellitus without complications: Secondary | ICD-10-CM | POA: Insufficient documentation

## 2013-06-20 DIAGNOSIS — I10 Essential (primary) hypertension: Secondary | ICD-10-CM | POA: Diagnosis not present

## 2013-06-20 DIAGNOSIS — Z79899 Other long term (current) drug therapy: Secondary | ICD-10-CM | POA: Insufficient documentation

## 2013-06-20 DIAGNOSIS — J45909 Unspecified asthma, uncomplicated: Secondary | ICD-10-CM | POA: Diagnosis not present

## 2013-06-20 DIAGNOSIS — I209 Angina pectoris, unspecified: Secondary | ICD-10-CM | POA: Insufficient documentation

## 2013-06-20 HISTORY — DX: Unspecified asthma, uncomplicated: J45.909

## 2013-06-20 HISTORY — DX: Anxiety disorder, unspecified: F41.9

## 2013-06-20 HISTORY — DX: Depression, unspecified: F32.A

## 2013-06-20 HISTORY — DX: Malignant neoplasm of prostate: C61

## 2013-06-20 HISTORY — DX: Major depressive disorder, single episode, unspecified: F32.9

## 2013-06-20 NOTE — Progress Notes (Signed)
[facility] Radiation Oncology NEW PATIENT EVALUATION  Name: Daniel Singleton MRN: [id number]  Date:   06/20/2013           DOB: 1941/07/09  Status: outpatient   CC: Alysia Penna, MD  Milford Cage,*    REFERRING PHYSICIAN: Milford Cage,*   DIAGNOSIS: Stage TI C. intermediate risk adenocarcinoma prostate   HISTORY OF PRESENT ILLNESS:  Daniel Singleton is a 72 y.o. male who is seen today for the courtesy of Dr. Margarita Grizzle for discussion of possible radiation therapy in the management of his stage TI C. intermediate risk adenocarcinoma prostate. He was noted to have an elevated PSA of 10.5 this past July with a repeat value of 8.02 on 04/20/2013. Because of rectal discomfort he underwent ultrasound-guided biopsies under anesthesia on 05/23/2013. He was found have Gleason 7 (3+4) involving 40% of one core from the left lateral mid gland, 80% of one core from the left lateral base and 40% of one core from the left medial apex. He also had Gleason 6 (3+3) from the left lateral apex. He is doing recently well from a GU and GI standpoint although he has had significant discomfort on digital rectal examination. He does not give a history of anal fissures or ano-rectal surgery. His I PSS score is 8. He is able to have erections with Levitra.  PREVIOUS RADIATION THERAPY: No   PAST MEDICAL HISTORY:  has a past medical history of Hypertension; Emphysema; Diabetes; Hypercholesteremia; PONV (postoperative nausea and vomiting); Angina at rest; Coronary artery disease; Arthritis; GERD (gastroesophageal reflux disease); Elevated PSA; Sleep apnea; Prostate cancer (05/23/13); Asthma; Depression; and Anxiety.     PAST SURGICAL HISTORY:  Past Surgical History  Procedure Laterality Date  . Heart bypass  2006    Quad  . Degloving rt leg injury  2000    had skin grafts  . Hernia repair  2008    x 3 w/mesh, bilat inguinla, umbilical  . Back surgery  2006    discectomy lumbar  .  Tonsillectomy      w/adenoidectomy  . Prostate biopsy N/A 05/23/2013    Procedure: BIOPSY TRANSRECTAL ULTRASONIC PROSTATE (TUBP);  Surgeon: Milford Cage, MD;  Location: [facility];  Service: Urology;  Laterality: N/A;  prostate nerve block  . Back surgery    . Foot surgery    . Back surgery       FAMILY HISTORY: family history includes Cancer in his sister; Depression in his sister; Heart attack in his mother; Heart disease in his mother. His father died in a drowning accident at 10. His mother died following a heart attack at 40. No family history prostate cancer.   SOCIAL HISTORY:  reports that he quit smoking about 14 years ago. His smoking use included Cigarettes. He has a 40 pack-year smoking history. He has never used smokeless tobacco. He reports that he drinks alcohol. He reports that he does not use illicit drugs. Married, 2 children. Retired Location manager for [employer].   ALLERGIES: Review of patient's allergies indicates no known allergies.   MEDICATIONS:  Current Outpatient Prescriptions  Medication Sig Dispense Refill  . albuterol (PROVENTIL HFA;VENTOLIN HFA) 108 (90 BASE) MCG/ACT inhaler Inhale 2 puffs into the lungs every 6 (six) hours as needed for wheezing.  1 Inhaler  3  . Arginine 1000 MG TABS Take 1 tablet by mouth daily.      . B Complex-C (B-COMPLEX WITH VITAMIN C) tablet Take 1 tablet  by mouth daily.      . Biotin 5000 MCG TABS Take 2 capsules by mouth daily.      . clopidogrel (PLAVIX) 75 MG tablet Take 1 tablet by mouth daily.      . DULoxetine (CYMBALTA) 60 MG capsule Take 60 mg by mouth daily.      Marland Kitchen ezetimibe (ZETIA) 10 MG tablet Take 10 mg by mouth every morning.      . Flaxseed, Linseed, (FLAXSEED OIL) 1200 MG CAPS Take 1 capsule by mouth daily.      . Melatonin 10 MG CAPS Take 10 mg by mouth daily.      . metoprolol (LOPRESSOR) 50 MG tablet Take 50 mg by mouth 2 (two) times daily.      . niacin (NIASPAN) 1000 MG CR tablet Take  1 tablet (1,000 mg total) by mouth at bedtime.  30 tablet  6  . nitroGLYCERIN (NITROSTAT) 0.3 MG SL tablet Place 0.3 mg under the tongue every 5 (five) minutes as needed for chest pain.      Marland Kitchen omega-3 acid ethyl esters (LOVAZA) 1 G capsule       . ranitidine (ZANTAC) 150 MG tablet Take 150 mg by mouth 2 (two) times daily.      . ranolazine (RANEXA) 500 MG 12 hr tablet Take 1 tablet (500 mg total) by mouth 2 (two) times daily.  60 tablet  6  . rosuvastatin (CRESTOR) 20 MG tablet Take 1 tablet (20 mg total) by mouth daily.  30 tablet  6  . vardenafil (LEVITRA) 20 MG tablet Take 20 mg by mouth daily as needed for erectile dysfunction.      . vitamin E 1000 UNIT capsule Take 1,000 Units by mouth daily.       No current facility-administered medications for this encounter.     REVIEW OF SYSTEMS:  Pertinent items are noted in HPI.    PHYSICAL EXAM: Alert and oriented 72 year old white male appearing his stated age. Wt Readings from Last 3 Encounters:  06/20/13 206 lb 3.2 oz (93.532 kg)  05/25/13 203 lb 14.4 oz (92.488 kg)  05/21/13 202 lb (91.627 kg)   Temp Readings from Last 3 Encounters:  06/20/13 98.1 F (36.7 C)   05/23/13 97 F (36.1 C) Oral  05/23/13 97 F (36.1 C) Oral   BP Readings from Last 3 Encounters:  06/20/13 123/75  05/25/13 110/76  05/23/13 131/81   Pulse Readings from Last 3 Encounters:  06/20/13 76  05/25/13 60  05/23/13 80   Head and neck examination: Grossly unremarkable. Nodes: Without palpable cervical or supraclavicular lymphadenopathy. Chest: Lungs clear. Back: Without spinal or CVA tenderness. Heart: Regular rate and rhythm. Abdomen: Without hepatomegaly. Genitalia: Unremarkable to inspection. Rectal examination: My examination is suboptimal and I was only able to palpate his apex secondary to discomfort. (Dr. Margarita Grizzle did not feel any prostatic abnormality on examination under anesthesia). Extremities atrophy of the right lower extremity secondary to trauma  in the remote past.    LABORATORY DATA:  Lab Results  Component Value Date   WBC 6.5 05/21/2013   HGB 14.9 05/21/2013   HCT 42.7 05/21/2013   MCV 87.3 05/21/2013   PLT 177 05/23/2013   Lab Results  Component Value Date   NA 138 05/21/2013   K 4.0 05/21/2013   CL 99 05/21/2013   CO2 30 05/21/2013   Lab Results  Component Value Date   ALT 16 05/04/2013   AST 17 05/04/2013   ALKPHOS 35* 05/04/2013  BILITOT 0.4 05/04/2013   PSA 8.02 from 04/20/2013   IMPRESSION: Stage TI C. intermediate risk adenocarcinoma prostate. I explained to the patient and his family that his prognosis is related to his stage, PSA level, and Gleason score. His stage and PSA level are favorable while his Gleason score of 7 is of intermediate favorability. His management options include surgery versus close surveillance/observation, and radiation therapy. Radiation therapy options include 5 weeks of external beam (3-D conformal) followed by a seed implant boost or 8 weeks of external beam/IMRT. With either radiation therapy option, I would consider short-term (6 months) androgen deprivation therapy in view of his disease volume and Gleason 7 disease. This is currently being looked at by the RTOG and a prospective randomized trial, but short-term androgen deprivation therapy has shown a survival improvement in single institution studies. We discussed the potential acute and late toxicities of radiation therapy. We also discussed the side effects of androgen deprivation therapy. If he wants to proceed with radiation therapy, I would not feel a strong need for placement of gold markers which would require another general anesthetic. We would use cone beam CT alone for his image guidance.   PLAN: As discussed above. He will visit with Dr. Margarita Grizzle after making a final decision regarding his choice of therapy. He can contact me if he want to consider radiation therapy.  I spent 60 minutes minutes face to face with the patient and  more than 50% of that time was spent in counseling and/or coordination of care.

## 2013-06-20 NOTE — Progress Notes (Addendum)
Please see the Nurse Progress Note in the MD Initial Consult Encounter for this patient.  Daniel Singleton reports no difficulty voiding and has nocturia x 1.

## 2013-06-22 ENCOUNTER — Encounter: Payer: Self-pay | Admitting: Radiation Oncology

## 2013-06-22 ENCOUNTER — Telehealth: Payer: Self-pay | Admitting: *Deleted

## 2013-06-22 NOTE — Progress Notes (Signed)
CC: Dr. Natalia Leatherwood   Chart note: The patient called today and he wants to proceed with 8 weeks of external beam/IMRT. He is willing to proceed with short-term (6 months) androgen deprivation therapy. As mentioned in my note, I do not feel the need for placement of 3 gold seeds which would otherwise require another general anesthetic. I'll contact Dr. Hilario Quarry office and request that he start his androgen deprivation therapy. I'll see the patient back for a followup visit in approximately 2 months.

## 2013-06-22 NOTE — Telephone Encounter (Signed)
CALLED PATIENT TO INFORM OF APPT. TO SEE DR. MURRAY ON 08-07-13- ARRIVAL TIME - 1:30 PM, SPOKE WITH PATIENT AND HE IS AWARE OF THIS APPT.

## 2013-06-27 DIAGNOSIS — C61 Malignant neoplasm of prostate: Secondary | ICD-10-CM | POA: Diagnosis not present

## 2013-06-28 NOTE — Progress Notes (Signed)
[facility] Psychosocial Distress Screening Clinical Social Work  Clinical Social Work was referred by distress screening protocol.  The patient scored a 6 on the Psychosocial Distress Thermometer which indicates moderate distress. Clinical Social Worker Intern telephoned to assess for distress and other psychosocial needs. Patient stated that as the result of injections he was real tired but that was to be expected.  Patient also stated that he was having hot flashes.  CSWI encouraged Patient to contact Dr if he had any questions or concerns about symptoms.  Patient stated that he has radiation coming up in January.  Patient has an understanding of current condition and plan.  CSWI provided supportive listening.  Patient was made aware of services and agrees to seek out further assistance if needed.   Clinical Social Worker follow up needed: no  If yes, follow up plan:   Dakin Madani S. [facility] Clinical Social Work Intern [employer] [facility] [phone]

## 2013-08-01 DIAGNOSIS — I2510 Atherosclerotic heart disease of native coronary artery without angina pectoris: Secondary | ICD-10-CM | POA: Diagnosis not present

## 2013-08-01 DIAGNOSIS — E785 Hyperlipidemia, unspecified: Secondary | ICD-10-CM | POA: Diagnosis not present

## 2013-08-02 LAB — NMR LIPOPROFILE WITH LIPIDS
HDL Particle Number: 31.9 umol/L (ref >=30.5)
HDL Size: 9.4 nm (ref >=9.2)
HDL-C: 46 mg/dL (ref >=40)
LDL Particle Number: 801 nmol/L (ref <1000)
LDL Size: 20.2 nm — ABNORMAL LOW (ref >20.5)
Large HDL-P: 5.7 umol/L (ref >=4.8)
Large VLDL-P: 3 nmol/L — ABNORMAL HIGH (ref <=2.7)
Small LDL Particle Number: 471 nmol/L (ref <=527)
Triglycerides: 96 mg/dL (ref <150)
VLDL Size: 48.9 nm — ABNORMAL HIGH (ref <=46.6)

## 2013-08-03 ENCOUNTER — Encounter: Payer: Self-pay | Admitting: *Deleted

## 2013-08-06 NOTE — Progress Notes (Signed)
GU Location of Tumor / Histology: prostate   If Prostate Cancer, Gleason Score is (3 + 4) and PSA is (8.02 on 04/20/13)   Patient presented 2 months ago with signs/symptoms of: elevated PSA   Biopsies of prostate (if applicable) revealed: adenocarcinoma 4/12 cores, Gleason 7, volume 31 cc   Past/Anticipated interventions by urology, if any: none  Past/Anticipated interventions by medical oncology, if any: none  Weight changes, if any: no  Bowel/Bladder complaints, if any: urinary frequency, stream starts/stops, urgency, nocturia x 1  Nausea/Vomiting, if any: no  Pain issues, if any: no   SAFETY ISSUES:  Prior radiation? no  Pacemaker/ICD? no  Possible current pregnancy? na  Is the patient on methotrexate? No  Current Complaints / other details: Married, retired, 1 son, 1 daughter. Pt wants to proceed w/8 weeks external beam/IMRT.  IPSS 15 on 06/20/13   IPSS 14 today. 07/06/13 received Lupron 45 mg injection, FU w/Dr Margarita Grizzle in 8 months for PSA, Testosterone

## 2013-08-07 ENCOUNTER — Ambulatory Visit
Admission: RE | Admit: 2013-08-07 | Discharge: 2013-08-07 | Disposition: A | Discharge status: Home / Self Care | Payer: Medicare Other | Source: Ambulatory Visit | Attending: Radiation Oncology | Admitting: Radiation Oncology

## 2013-08-07 ENCOUNTER — Encounter: Payer: Self-pay | Admitting: Radiation Oncology

## 2013-08-07 VITALS — BP 121/74 | HR 82 | Temp 97.9°F | Resp 20 | Wt 204.3 lb

## 2013-08-07 DIAGNOSIS — C61 Malignant neoplasm of prostate: Secondary | ICD-10-CM

## 2013-08-07 DIAGNOSIS — R972 Elevated prostate specific antigen [PSA]: Secondary | ICD-10-CM | POA: Diagnosis not present

## 2013-08-07 NOTE — Progress Notes (Signed)
Please see the Nurse Progress Note in the MD Initial Consult Encounter for this patient. 

## 2013-08-07 NOTE — Progress Notes (Signed)
CC: Dr. Natalia Leatherwood  Followup note:  Diagnosis: Stage TI C. intermediate risk adenocarcinoma prostate   [name] returns today for review and scheduling of his radiation therapy.He was noted to have an elevated PSA of 10.5 this past July with a repeat value of 8.02 on 04/20/2013. Because of rectal discomfort he underwent ultrasound-guided biopsies under anesthesia on 05/23/2013. He was found have Gleason 7 (3+4) involving 40% of one core from the left lateral mid gland, 80% of one core from the left lateral base and 40% of one core from the left medial apex. He also had Gleason 6 (3+3) from the left lateral apex. He is doing recently well from a GU and GI standpoint although he has had significant discomfort on digital rectal examination. He does not give a history of anal fissures or ano-rectal surgery. He elected for external beam/IMRT. We also discussed short-term (6 months) androgen deprivation therapy, and he received a six-month injection on November 5 through Dr. Margarita Grizzle. He does report hot flashes as expected. He does have mild to moderate fatigue. His I PSS score today is 14. We decided to treat him with cone beam CT alone for image guidance and not place 3 gold seeds which require another anesthesia because of his rectal discomfort.  Physical examination: Alert and oriented. Filed Vitals:   08/07/13 1341  BP: 121/74  Pulse: 82  Temp: 97.9 F (36.6 C)  Resp: 20   He's not examined today.  Impression: Stage TI C. intermediate risk adenocarcinoma prostate. We can review the potential acute and late toxicities of radiation therapy. I will like to wait until mid-January before proceeding with his simulation/treatment planning. We reviewed our desire to have him treated with a comfortably full bladder. He was given literature for review.  Plan: We'll have her return the week of January 19 for Simulation/treatment planning.  30 minutes was spent face-to-face the patient, primarily  counseling the patient and coordinating his care.

## 2013-08-14 DIAGNOSIS — Z6829 Body mass index (BMI) 29.0-29.9, adult: Secondary | ICD-10-CM | POA: Diagnosis not present

## 2013-08-14 DIAGNOSIS — I2510 Atherosclerotic heart disease of native coronary artery without angina pectoris: Secondary | ICD-10-CM | POA: Diagnosis not present

## 2013-08-14 DIAGNOSIS — E1169 Type 2 diabetes mellitus with other specified complication: Secondary | ICD-10-CM | POA: Diagnosis not present

## 2013-08-14 DIAGNOSIS — R1110 Vomiting, unspecified: Secondary | ICD-10-CM | POA: Diagnosis not present

## 2013-08-14 DIAGNOSIS — F411 Generalized anxiety disorder: Secondary | ICD-10-CM | POA: Diagnosis not present

## 2013-08-15 ENCOUNTER — Inpatient Hospital Stay (HOSPITAL_COMMUNITY)
Admission: EM | Admit: 2013-08-15 | Discharge: 2013-08-16 | DRG: 392 | Disposition: A | Discharge status: Home / Self Care | Payer: Medicare Other | Attending: Internal Medicine | Admitting: Internal Medicine

## 2013-08-15 ENCOUNTER — Encounter (HOSPITAL_COMMUNITY): Payer: Self-pay | Admitting: Emergency Medicine

## 2013-08-15 ENCOUNTER — Inpatient Hospital Stay (HOSPITAL_COMMUNITY): Payer: Medicare Other

## 2013-08-15 DIAGNOSIS — I2510 Atherosclerotic heart disease of native coronary artery without angina pectoris: Secondary | ICD-10-CM | POA: Diagnosis present

## 2013-08-15 DIAGNOSIS — J449 Chronic obstructive pulmonary disease, unspecified: Secondary | ICD-10-CM | POA: Diagnosis not present

## 2013-08-15 DIAGNOSIS — R112 Nausea with vomiting, unspecified: Principal | ICD-10-CM | POA: Diagnosis present

## 2013-08-15 DIAGNOSIS — E860 Dehydration: Secondary | ICD-10-CM | POA: Diagnosis present

## 2013-08-15 DIAGNOSIS — E119 Type 2 diabetes mellitus without complications: Secondary | ICD-10-CM | POA: Diagnosis present

## 2013-08-15 DIAGNOSIS — E878 Other disorders of electrolyte and fluid balance, not elsewhere classified: Secondary | ICD-10-CM

## 2013-08-15 DIAGNOSIS — K56609 Unspecified intestinal obstruction, unspecified as to partial versus complete obstruction: Secondary | ICD-10-CM | POA: Diagnosis not present

## 2013-08-15 DIAGNOSIS — Z951 Presence of aortocoronary bypass graft: Secondary | ICD-10-CM | POA: Diagnosis not present

## 2013-08-15 DIAGNOSIS — C61 Malignant neoplasm of prostate: Secondary | ICD-10-CM | POA: Diagnosis present

## 2013-08-15 DIAGNOSIS — R197 Diarrhea, unspecified: Secondary | ICD-10-CM | POA: Diagnosis present

## 2013-08-15 DIAGNOSIS — I10 Essential (primary) hypertension: Secondary | ICD-10-CM | POA: Diagnosis present

## 2013-08-15 DIAGNOSIS — E871 Hypo-osmolality and hyponatremia: Secondary | ICD-10-CM | POA: Diagnosis present

## 2013-08-15 DIAGNOSIS — Z79899 Other long term (current) drug therapy: Secondary | ICD-10-CM | POA: Diagnosis not present

## 2013-08-15 DIAGNOSIS — R748 Abnormal levels of other serum enzymes: Secondary | ICD-10-CM | POA: Diagnosis not present

## 2013-08-15 LAB — CBC
HCT: 42.3 % (ref 39.0–52.0)
MCH: 31.3 pg (ref 26.0–34.0)
MCHC: 36.6 g/dL — ABNORMAL HIGH (ref 30.0–36.0)
Platelets: 212 10*3/uL (ref 150–400)
RBC: 4.96 MIL/uL (ref 4.22–5.81)
WBC: 7.8 10*3/uL (ref 4.0–10.5)

## 2013-08-15 LAB — COMPREHENSIVE METABOLIC PANEL
Albumin: 3.6 g/dL (ref 3.5–5.2)
Alkaline Phosphatase: 34 U/L — ABNORMAL LOW (ref 39–117)
BUN: 27 mg/dL — ABNORMAL HIGH (ref 6–23)
CO2: 30 mEq/L (ref 19–32)
Chloride: 90 mEq/L — ABNORMAL LOW (ref 96–112)
Creatinine, Ser: 1.28 mg/dL (ref 0.50–1.35)
GFR calc Af Amer: 64 mL/min — ABNORMAL LOW (ref >90)
GFR calc non Af Amer: 55 mL/min — ABNORMAL LOW (ref >90)
Glucose, Bld: 198 mg/dL — ABNORMAL HIGH (ref 70–99)
Potassium: 3.7 mEq/L (ref 3.5–5.1)
Total Bilirubin: 0.6 mg/dL (ref 0.3–1.2)

## 2013-08-15 LAB — TROPONIN I: Troponin I: <0.3 ng/mL (ref <0.30)

## 2013-08-15 LAB — POCT I-STAT TROPONIN I: Troponin i, poc: 0 ng/mL (ref 0.00–0.08)

## 2013-08-15 LAB — GLUCOSE, CAPILLARY: Glucose-Capillary: 96 mg/dL (ref 70–99)

## 2013-08-15 LAB — HEMOGLOBIN A1C: Mean Plasma Glucose: 146 mg/dL — ABNORMAL HIGH (ref <117)

## 2013-08-15 MED ORDER — ALBUTEROL SULFATE HFA 108 (90 BASE) MCG/ACT IN AERS
2.0000 | INHALATION_SPRAY | Freq: Four times a day (QID) | RESPIRATORY_TRACT | Status: DC | PRN
  Filled 2013-08-15: qty 6.7

## 2013-08-15 MED ORDER — ACETAMINOPHEN 325 MG PO TABS: 650.0000 mg | ORAL_TABLET | Freq: Four times a day (QID) | ORAL | Status: DC | PRN

## 2013-08-15 MED ORDER — ENOXAPARIN SODIUM 40 MG/0.4ML ~~LOC~~ SOLN
40.0000 mg | SUBCUTANEOUS | Status: DC
  Administered 2013-08-15: 40 mg via SUBCUTANEOUS
  Filled 2013-08-15 (×2): qty 0.4

## 2013-08-15 MED ORDER — ZOLPIDEM TARTRATE 5 MG PO TABS: 5.0000 mg | ORAL_TABLET | Freq: Every evening | ORAL | Status: DC | PRN

## 2013-08-15 MED ORDER — ONDANSETRON HCL 4 MG/2ML IJ SOLN
4.0000 mg | Freq: Once | INTRAMUSCULAR | Status: AC
  Administered 2013-08-15: 4 mg via INTRAVENOUS
  Filled 2013-08-15: qty 2

## 2013-08-15 MED ORDER — ACETAMINOPHEN 650 MG RE SUPP: 650.0000 mg | Freq: Four times a day (QID) | RECTAL | Status: DC | PRN

## 2013-08-15 MED ORDER — INSULIN ASPART 100 UNIT/ML ~~LOC~~ SOLN: 0.0000 [IU] | Freq: Three times a day (TID) | SUBCUTANEOUS | Status: DC

## 2013-08-15 MED ORDER — ONDANSETRON HCL 4 MG/2ML IJ SOLN
4.0000 mg | Freq: Four times a day (QID) | INTRAMUSCULAR | Status: DC | PRN
Start: 2013-08-15 — End: 2013-08-16

## 2013-08-15 MED ORDER — METOPROLOL TARTRATE 50 MG PO TABS
50.0000 mg | ORAL_TABLET | Freq: Two times a day (BID) | ORAL | Status: DC
  Administered 2013-08-16: 50 mg via ORAL
  Filled 2013-08-15 (×3): qty 1

## 2013-08-15 MED ORDER — CLOPIDOGREL BISULFATE 75 MG PO TABS
75.0000 mg | ORAL_TABLET | Freq: Every day | ORAL | Status: DC
  Administered 2013-08-15 – 2013-08-16 (×2): 75 mg via ORAL
  Filled 2013-08-15 (×2): qty 1

## 2013-08-15 MED ORDER — SODIUM CHLORIDE 0.9 % IV SOLN
INTRAVENOUS | Status: DC
  Administered 2013-08-15: 100 mL/h via INTRAVENOUS

## 2013-08-15 MED ORDER — RANOLAZINE ER 500 MG PO TB12
500.0000 mg | ORAL_TABLET | Freq: Two times a day (BID) | ORAL | Status: DC
  Administered 2013-08-15 – 2013-08-16 (×2): 500 mg via ORAL
  Filled 2013-08-15 (×3): qty 1

## 2013-08-15 MED ORDER — SODIUM CHLORIDE 0.9 % IV SOLN
1000.0000 mL | INTRAVENOUS | Status: DC
  Administered 2013-08-15: 1000 mL via INTRAVENOUS

## 2013-08-15 MED ORDER — SODIUM CHLORIDE 0.9 % IV BOLUS (SEPSIS)
500.0000 mL | Freq: Once | INTRAVENOUS | Status: AC
  Administered 2013-08-15: 500 mL via INTRAVENOUS

## 2013-08-15 MED ORDER — ATORVASTATIN CALCIUM 40 MG PO TABS
40.0000 mg | ORAL_TABLET | Freq: Every day | ORAL | Status: DC
  Administered 2013-08-15: 40 mg via ORAL
  Filled 2013-08-15 (×2): qty 1

## 2013-08-15 MED ORDER — NITROGLYCERIN 0.3 MG SL SUBL
0.3000 mg | SUBLINGUAL_TABLET | SUBLINGUAL | Status: DC | PRN
  Filled 2013-08-15: qty 100

## 2013-08-15 MED ORDER — PANTOPRAZOLE SODIUM 40 MG IV SOLR
40.0000 mg | INTRAVENOUS | Status: DC
  Administered 2013-08-15: 40 mg via INTRAVENOUS
  Filled 2013-08-15 (×2): qty 40

## 2013-08-15 MED ORDER — ONDANSETRON HCL 4 MG PO TABS: 4.0000 mg | ORAL_TABLET | Freq: Four times a day (QID) | ORAL | Status: DC | PRN

## 2013-08-15 MED ORDER — FAMOTIDINE IN NACL 20-0.9 MG/50ML-% IV SOLN
20.0000 mg | Freq: Once | INTRAVENOUS | Status: AC
  Administered 2013-08-15: 20 mg via INTRAVENOUS
  Filled 2013-08-15: qty 50

## 2013-08-15 MED ORDER — SODIUM CHLORIDE 0.9 % IV SOLN
1000.0000 mL | Freq: Once | INTRAVENOUS | Status: AC
  Administered 2013-08-15: 1000 mL via INTRAVENOUS

## 2013-08-15 MED ORDER — LOPERAMIDE HCL 2 MG PO CAPS
4.0000 mg | ORAL_CAPSULE | Freq: Once | ORAL | Status: AC
  Administered 2013-08-15: 4 mg via ORAL
  Filled 2013-08-15: qty 2

## 2013-08-15 NOTE — H&P (Signed)
PCP:   Alysia Penna, MD   Chief Complaint:  n/v HPI: Mr. Bibbee is a 72 year old gentleman with a history of diabetes mellitus type 2, coronary artery disease, hypertension, COPD presenting at this time with intractable nausea vomiting and diarrhea. He evidently presented to the office yesterday with similar symptoms was given some Zofran does continue with progressive nausea vomiting and intolerant of all intake. He presented to the emergency room where he is felt to be dry tachycardic and even emergency room is had continued nausea and vomiting albeit it is settled down now after about 5-6 hours and some fluid boluses. While he does relate some generalized abnormal soreness from vomiting as no frank pain. He's had no documented fever. He has had some hot flashes largely related to his hormone injection for his prostate cancer and is due to undergo radiation therapy in the upcoming weeks. He's had no cough congestion and no rash. Denies any headache or neck stiffness. Has no generalized muscle soreness. He has had some accompanying loose diarrheal stools 3-5 daily at least 2 in the near.   Past Medical History: Past Medical History  Diagnosis Date  . Hypertension   . Emphysema   . Diabetes   . Hypercholesteremia   . PONV (postoperative nausea and vomiting)   . Angina at rest   . Coronary artery disease     recently started seeing Dr. Tresa Endo   . Arthritis   . GERD (gastroesophageal reflux disease)   . Elevated PSA   . Sleep apnea     c pap with oxygen at 2.5 L  . Prostate cancer 05/23/13    gleason 3+4=7, volume 31 cc  . Asthma   . Depression   . Anxiety    Past Surgical History  Procedure Laterality Date  . Heart bypass  2006    Quad  . Degloving rt leg injury  2000    had skin grafts  . Hernia repair  2008    x 3 w/mesh, bilat inguinla, umbilical  . Back surgery  2006    discectomy lumbar  . Tonsillectomy      w/adenoidectomy  . Prostate biopsy N/A 05/23/2013     Procedure: BIOPSY TRANSRECTAL ULTRASONIC PROSTATE (TUBP);  Surgeon: Milford Cage, MD;  Location: [facility];  Service: Urology;  Laterality: N/A;  prostate nerve block  . Back surgery    . Foot surgery    . Back surgery      Medications: Prior to Admission medications   Medication Sig Start Date End Date Taking? Authorizing Provider  albuterol (PROVENTIL HFA;VENTOLIN HFA) 108 (90 BASE) MCG/ACT inhaler Inhale 2 puffs into the lungs every 6 (six) hours as needed for wheezing. 05/28/13  Yes Waymon Budge, MD  ALPRAZolam Prudy Feeler) 0.25 MG tablet Take 0.25 mg by mouth daily. 07/24/13  Yes Historical Provider, MD  B Complex-C (B-COMPLEX WITH VITAMIN C) tablet Take 1 tablet by mouth daily.   Yes Historical Provider, MD  Biotin 5000 MCG TABS Take 2 capsules by mouth daily.   Yes Historical Provider, MD  clopidogrel (PLAVIX) 75 MG tablet Take 1 tablet by mouth daily. 05/23/13  Yes Historical Provider, MD  DULoxetine (CYMBALTA) 60 MG capsule Take 60 mg by mouth daily.   Yes Historical Provider, MD  ezetimibe (ZETIA) 10 MG tablet Take 10 mg by mouth every morning.   Yes Historical Provider, MD  Flaxseed, Linseed, (FLAXSEED OIL) 1200 MG CAPS Take 1 capsule by mouth daily.   Yes Historical Provider, MD  Melatonin 10 MG CAPS Take 10 mg by mouth daily.   Yes Historical Provider, MD  metoprolol (LOPRESSOR) 50 MG tablet Take 50 mg by mouth 2 (two) times daily.   Yes Historical Provider, MD  niacin (NIASPAN) 1000 MG CR tablet Take 1 tablet (1,000 mg total) by mouth at bedtime. 05/25/13  Yes Lennette Bihari, MD  nitroGLYCERIN (NITROSTAT) 0.3 MG SL tablet Place 0.3 mg under the tongue every 5 (five) minutes as needed for chest pain.   Yes Historical Provider, MD  omega-3 acid ethyl esters (LOVAZA) 1 G capsule  05/16/13  Yes Historical Provider, MD  ondansetron (ZOFRAN-ODT) 4 MG disintegrating tablet Take 4 mg by mouth every 6 (six) hours as needed. 08/14/13  Yes Historical Provider, MD  ranitidine (ZANTAC) 150 MG  tablet Take 150 mg by mouth 2 (two) times daily.   Yes Historical Provider, MD  ranolazine (RANEXA) 500 MG 12 hr tablet Take 1 tablet (500 mg total) by mouth 2 (two) times daily. 03/16/13  Yes Lennette Bihari, MD  rosuvastatin (CRESTOR) 20 MG tablet Take 1 tablet (20 mg total) by mouth daily. 05/25/13  Yes Lennette Bihari, MD  vardenafil (LEVITRA) 20 MG tablet Take 20 mg by mouth daily as needed for erectile dysfunction.   Yes Historical Provider, MD  vitamin E 1000 UNIT capsule Take 1,000 Units by mouth daily.   Yes Historical Provider, MD    Allergies:  No Known Allergies  Social History:  reports that he quit smoking about 14 years ago. His smoking use included Cigarettes. He has a 40 pack-year smoking history. He has never used smokeless tobacco. He reports that he drinks alcohol. He reports that he does not use illicit drugs.  Family History: Family History  Problem Relation Age of Onset  . Heart disease Mother   . Heart attack Mother   . Cancer Sister   . Depression Sister     Physical Exam: Filed Vitals:   08/15/13 1513 08/15/13 1515 08/15/13 1600 08/15/13 1618  BP: 116/62  123/60   Pulse: 109   97  Temp:  99 F (37.2 C)    TempSrc:  Oral    Resp:      Height:      Weight:      SpO2:    92%   General appearance: alert, cooperative and no distress Head: Normocephalic, without obvious abnormality, atraumatic Eyes: conjunctivae/corneas clear. PERRL, EOM's intact.  Nose: Nares normal. Septum midline. Mucosa normal. No drainage or sinus tenderness. Throat: lips, mucosa, and tongue normal; teeth and gums normal Neck: no adenopathy, no carotid bruit, no JVD and thyroid not enlarged, symmetric, no tenderness/mass/nodules Resp: clear to auscultation bilaterally Cardio: regular rate and rhythm, S1, S2 normal, no murmur, click, rub or gallop GI: soft, non-tender; bowel sounds normal; no masses,  no organomegaly Extremities: extremities normal, atraumatic, no cyanosis or  edema Pulses: 2+ and symmetric Lymph nodes: Cervical adenopathy: no cervical lymphadenopathy Neurologic: Alert and oriented X 3, normal strength and tone. Normal symmetric reflexes.     Labs on Admission:   Recent Labs  08/15/13 1114  NA 134*  K 3.7  CL 90*  CO2 30  GLUCOSE 198*  BUN 27*  CREATININE 1.28  CALCIUM 9.5    Recent Labs  08/15/13 1114  AST 16  ALT 17  ALKPHOS 34*  BILITOT 0.6  PROT 6.9  ALBUMIN 3.6   No results found for this basename: LIPASE, AMYLASE,  in the last 72 hours  Recent  Labs  08/15/13 1114  WBC 7.8  HGB 15.5  HCT 42.3  MCV 85.3  PLT 212    Recent Labs  08/15/13 1114  TROPONINI <0.30   No results found for this basename: TSH, T4TOTAL, FREET3, T3FREE, THYROIDAB,  in the last 72 hours No results found for this basename: VITAMINB12, FOLATE, FERRITIN, TIBC, IRON, RETICCTPCT,  in the last 72 hours  Radiological Exams on Admission: No results found. Orders placed during the hospital encounter of 08/15/13  . ED EKG  . ED EKG  . EKG 12-LEAD  . EKG 12-LEAD    Assessment/Plan Active Problems: #1  N&V (nausea and vomiting)- believe to be viral no evidence of ileus or cholecystitis and exam is fairly benign will check films and hydrate treat symptomatically  #2 diabetes mellitus type 2 seems to be diet controlled will cover with sliding scale  #3 coronary artery disease with remote coronary artery bypass grafting in stable anginal pattern  #4 COPD  #5 essential hypertension  #6 prostate cancer under the care of urology   Admit for IV fluids, antibiotics and further pending course. Have held several of his medications second potentiate some of the symptoms.   Ariyah Sedlack A 08/15/2013, 4:53 PM

## 2013-08-15 NOTE — ED Provider Notes (Signed)
CSN: [id number]     Arrival date & time 08/15/13  1039 History   First MD Initiated Contact with Patient 08/15/13 1056     Chief Complaint  Patient presents with  . Nausea  . Diarrhea   (Consider location/radiation/quality/duration/timing/severity/associated sxs/prior Treatment) HPI Patient reports 3 days ago they went to a Christmas dinner in the afternoon at church. The only thing he ate different from his wife was a potato salad and Jell-O fruit salad. Later that evening he started complaining of upper abdominal discomfort. He then started having vomiting and diarrhea. He had 7 episodes the first day, his only had one today. That was after taking Zofran and waiting 30 minutes to drink Pedialyte. He also is having diarrhea described as loose stools about twice a day. He has some upper abdominal discomfort that he states is sharp and burning. The sharp pain comes and goes and lasts a few minutes. His abdomen is sore from vomiting. His wife states she gave him TUMS for the burning pain. He states he also gets burning in his throat with the vomitus. He denies any blood in his vomitus or his diarrhea. He denies melena. He has some decreased urination. He  States he has not been around any one else who is ill. Patient also states he got a Lupron shot on December 11 which makes him feel hot and have hot flashes.  Patient states he has chest pain intermittently. He had some chest pain Sunday that resolved with one nitroglycerin. He states that is typical.   PCP [facility]  Past Medical History  Diagnosis Date  . Hypertension   . Emphysema   . Diabetes   . Hypercholesteremia   . PONV (postoperative nausea and vomiting)   . Angina at rest   . Coronary artery disease     recently started seeing Dr. Tresa Endo   . Arthritis   . GERD (gastroesophageal reflux disease)   . Elevated PSA   . Sleep apnea     c pap with oxygen at 2.5 L  . Prostate cancer 05/23/13    gleason  3+4=7, volume 31 cc  . Asthma   . Depression   . Anxiety    Past Surgical History  Procedure Laterality Date  . Heart bypass  2006    Quad  . Degloving rt leg injury  2000    had skin grafts  . Hernia repair  2008    x 3 w/mesh, bilat inguinla, umbilical  . Back surgery  2006    discectomy lumbar  . Tonsillectomy      w/adenoidectomy  . Prostate biopsy N/A 05/23/2013    Procedure: BIOPSY TRANSRECTAL ULTRASONIC PROSTATE (TUBP);  Surgeon: Milford Cage, MD;  Location: [facility];  Service: Urology;  Laterality: N/A;  prostate nerve block  . Back surgery    . Foot surgery    . Back surgery     Family History  Problem Relation Age of Onset  . Heart disease Mother   . Heart attack Mother   . Cancer Sister   . Depression Sister    History  Substance Use Topics  . Smoking status: Former Smoker -- 1.00 packs/day for 40 years    Types: Cigarettes    Quit date: 01/17/1999  . Smokeless tobacco: Never Used  . Alcohol Use: Yes     Comment: 1 beer weekly  lives at home Lives with spouse  Review of Systems  All other systems reviewed and are  negative.    Allergies  Review of patient's allergies indicates no known allergies.  Home Medications   Current Outpatient Rx  Name  Route  Sig  Dispense  Refill  . albuterol (PROVENTIL HFA;VENTOLIN HFA) 108 (90 BASE) MCG/ACT inhaler   Inhalation   Inhale 2 puffs into the lungs every 6 (six) hours as needed for wheezing.   1 Inhaler   3   . ALPRAZolam (XANAX) 0.25 MG tablet   Oral   Take 0.25 mg by mouth daily.         . B Complex-C (B-COMPLEX WITH VITAMIN C) tablet   Oral   Take 1 tablet by mouth daily.         . Biotin 5000 MCG TABS   Oral   Take 2 capsules by mouth daily.         . clopidogrel (PLAVIX) 75 MG tablet   Oral   Take 1 tablet by mouth daily.         . DULoxetine (CYMBALTA) 60 MG capsule   Oral   Take 60 mg by mouth daily.         Marland Kitchen ezetimibe (ZETIA) 10 MG tablet   Oral   Take 10 mg by  mouth every morning.         . Flaxseed, Linseed, (FLAXSEED OIL) 1200 MG CAPS   Oral   Take 1 capsule by mouth daily.         . Melatonin 10 MG CAPS   Oral   Take 10 mg by mouth daily.         . metoprolol (LOPRESSOR) 50 MG tablet   Oral   Take 50 mg by mouth 2 (two) times daily.         . niacin (NIASPAN) 1000 MG CR tablet   Oral   Take 1 tablet (1,000 mg total) by mouth at bedtime.   30 tablet   6   . nitroGLYCERIN (NITROSTAT) 0.3 MG SL tablet   Sublingual   Place 0.3 mg under the tongue every 5 (five) minutes as needed for chest pain.         Marland Kitchen omega-3 acid ethyl esters (LOVAZA) 1 G capsule               . ondansetron (ZOFRAN-ODT) 4 MG disintegrating tablet   Oral   Take 4 mg by mouth every 6 (six) hours as needed.         . ranitidine (ZANTAC) 150 MG tablet   Oral   Take 150 mg by mouth 2 (two) times daily.         . ranolazine (RANEXA) 500 MG 12 hr tablet   Oral   Take 1 tablet (500 mg total) by mouth 2 (two) times daily.   60 tablet   6   . rosuvastatin (CRESTOR) 20 MG tablet   Oral   Take 1 tablet (20 mg total) by mouth daily.   30 tablet   6   . vardenafil (LEVITRA) 20 MG tablet   Oral   Take 20 mg by mouth daily as needed for erectile dysfunction.         . vitamin E 1000 UNIT capsule   Oral   Take 1,000 Units by mouth daily.          BP 105/77  Pulse 117  Temp(Src) 97.7 F (36.5 C) (Oral)  Resp 20  Ht 5\' 11"  (1.803 m)  Wt 203 lb (92.08 kg)  BMI 28.33  kg/m2  SpO2 89%  Vital signs normal except tachycardia  Physical Exam  Nursing note and vitals reviewed. Constitutional: He is oriented to person, place, and time. He appears well-developed and well-nourished.  Non-toxic appearance. He does not appear ill. No distress.  HENT:  Head: Normocephalic and atraumatic.  Right Ear: External ear normal.  Left Ear: External ear normal.  Nose: Nose normal. No mucosal edema or rhinorrhea.  Mouth/Throat: Mucous membranes are  normal. No dental abscesses or uvula swelling.  Dry tongue  Eyes: Conjunctivae and EOM are normal. Pupils are equal, round, and reactive to light.  Neck: Normal range of motion and full passive range of motion without pain. Neck supple.  Cardiovascular: Regular rhythm and normal heart sounds.  Tachycardia present.  Exam reveals no gallop and no friction rub.   No murmur heard. Pulmonary/Chest: Effort normal and breath sounds normal. No respiratory distress. He has no wheezes. He has no rhonchi. He has no rales. He exhibits no tenderness and no crepitus.  Abdominal: Soft. Normal appearance and bowel sounds are normal. He exhibits no distension. There is no tenderness. There is no rebound and no guarding.  Musculoskeletal: Normal range of motion. He exhibits no edema and no tenderness.  Moves all extremities well.   Neurological: He is alert and oriented to person, place, and time. He has normal strength. No cranial nerve deficit.  Skin: Skin is warm, dry and intact. No rash noted. No erythema. There is pallor.  Psychiatric: He has a normal mood and affect. His speech is normal and behavior is normal. His mood appears not anxious.    ED Course  Procedures (including critical care time)   Medications  0.9 %  sodium chloride infusion (0 mLs Intravenous Stopped 08/15/13 1259)    Followed by  0.9 %  sodium chloride infusion (1,000 mLs Intravenous New Bag/Given 08/15/13 1254)  loperamide (IMODIUM) capsule 4 mg (not administered)  sodium chloride 0.9 % bolus 500 mL (not administered)  ondansetron (ZOFRAN) injection 4 mg (not administered)  famotidine (PEPCID) IVPB 20 mg (not administered)  ondansetron (ZOFRAN) injection 4 mg (4 mg Intravenous Given 08/15/13 1134)   Pt continues to have diarrhea and nausea. Wife concerned about him going home.   14:54 Dr Jacky Kindle, will come see patient in a few hours and decide if he needs to be admitted.   Labs Review Results for orders placed during the  hospital encounter of 08/15/13  CBC      Result Value Range   WBC 7.8  4.0 - 10.5 K/uL   RBC 4.96  4.22 - 5.81 MIL/uL   Hemoglobin 15.5  13.0 - 17.0 g/dL   HCT 47.8  29.5 - 62.1 %   MCV 85.3  78.0 - 100.0 fL   MCH 31.3  26.0 - 34.0 pg   MCHC 36.6 (*) 30.0 - 36.0 g/dL   RDW 30.8  65.7 - 84.6 %   Platelets 212  150 - 400 K/uL  COMPREHENSIVE METABOLIC PANEL      Result Value Range   Sodium 134 (*) 135 - 145 mEq/L   Potassium 3.7  3.5 - 5.1 mEq/L   Chloride 90 (*) 96 - 112 mEq/L   CO2 30  19 - 32 mEq/L   Glucose, Bld 198 (*) 70 - 99 mg/dL   BUN 27 (*) 6 - 23 mg/dL   Creatinine, Ser 9.62  0.50 - 1.35 mg/dL   Calcium 9.5  8.4 - 95.2 mg/dL   Total Protein 6.9  6.0 - 8.3 g/dL   Albumin 3.6  3.5 - 5.2 g/dL   AST 16  0 - 37 U/L   ALT 17  0 - 53 U/L   Alkaline Phosphatase 34 (*) 39 - 117 U/L   Total Bilirubin 0.6  0.3 - 1.2 mg/dL   GFR calc non Af Amer 55 (*) >90 mL/min   GFR calc Af Amer 64 (*) >90 mL/min  TROPONIN I      Result Value Range   Troponin I <0.30  <0.30 ng/mL  POCT I-STAT TROPONIN I      Result Value Range   Troponin i, poc 0.00  0.00 - 0.08 ng/mL   Comment 3            Laboratory interpretation all normal except minor hyponatremia, low chloride, and elevated BUN all consistent with dehydration    Imaging Review No results found.  EKG Interpretation    Date/Time:  Wednesday August 15 2013 10:52:57 EST Ventricular Rate:  112 PR Interval:  136 QRS Duration: 78 QT Interval:  328 QTC Calculation: 447 R Axis:   83 Text Interpretation:  Sinus tachycardia Possible Left atrial enlargement ST \\T \ T wave abnormality, consider inferior ischemia No old tracing to compare in MUSE Old tracing  from 05-25-2013 from doctors office shows new flipped T waves in inf leads with ST depression inf/laterally Confirmed by Savas Elvin  MD-I, Dhalia Zingaro ([id number]) on 08/15/2013 11:42:58 AM            MDM   1. Hyponatremia   2. Serum chloride decreased   3. Dehydration   4. Nausea  vomiting and diarrhea     Disposition per Dr Carlisle Cater, MD, Franz Dell, MD 08/15/13 [phone]

## 2013-08-15 NOTE — ED Notes (Signed)
Pt here with n/v/d  Since Sunday , pt went to his Md office yesterday and was given zofran  But it has not helped

## 2013-08-16 DIAGNOSIS — I2510 Atherosclerotic heart disease of native coronary artery without angina pectoris: Secondary | ICD-10-CM | POA: Diagnosis not present

## 2013-08-16 DIAGNOSIS — I10 Essential (primary) hypertension: Secondary | ICD-10-CM | POA: Diagnosis not present

## 2013-08-16 DIAGNOSIS — E119 Type 2 diabetes mellitus without complications: Secondary | ICD-10-CM | POA: Diagnosis not present

## 2013-08-16 DIAGNOSIS — R112 Nausea with vomiting, unspecified: Secondary | ICD-10-CM | POA: Diagnosis not present

## 2013-08-16 DIAGNOSIS — J449 Chronic obstructive pulmonary disease, unspecified: Secondary | ICD-10-CM | POA: Diagnosis not present

## 2013-08-16 LAB — BASIC METABOLIC PANEL
CO2: 26 mEq/L (ref 19–32)
Chloride: 101 mEq/L (ref 96–112)
Creatinine, Ser: 1 mg/dL (ref 0.50–1.35)
GFR calc non Af Amer: 74 mL/min — ABNORMAL LOW (ref >90)
Potassium: 3.4 mEq/L — ABNORMAL LOW (ref 3.5–5.1)
Sodium: 137 mEq/L (ref 135–145)

## 2013-08-16 LAB — CBC
HCT: 34.2 % — ABNORMAL LOW (ref 39.0–52.0)
Hemoglobin: 12 g/dL — ABNORMAL LOW (ref 13.0–17.0)
MCV: 87.5 fL (ref 78.0–100.0)
Platelets: 131 10*3/uL — ABNORMAL LOW (ref 150–400)
RBC: 3.91 MIL/uL — ABNORMAL LOW (ref 4.22–5.81)
RDW: 13.7 % (ref 11.5–15.5)
WBC: 5.7 10*3/uL (ref 4.0–10.5)

## 2013-08-16 LAB — GLUCOSE, CAPILLARY
Glucose-Capillary: 85 mg/dL (ref 70–99)
Glucose-Capillary: 71 mg/dL (ref 70–99)

## 2013-08-16 MED ORDER — PANTOPRAZOLE SODIUM 40 MG PO TBEC: 40.0000 mg | DELAYED_RELEASE_TABLET | Freq: Every day | ORAL | Status: DC

## 2013-08-16 NOTE — Progress Notes (Signed)
Pt discharged home. D/c instructions given. No questions verbalized. Vital stable.

## 2013-08-17 ENCOUNTER — Encounter (HOSPITAL_COMMUNITY): Payer: Self-pay | Admitting: Emergency Medicine

## 2013-08-17 ENCOUNTER — Emergency Department (HOSPITAL_COMMUNITY): Payer: Medicare Other

## 2013-08-17 ENCOUNTER — Inpatient Hospital Stay (HOSPITAL_COMMUNITY)
Admission: EM | Admit: 2013-08-17 | Discharge: 2013-08-21 | DRG: 390 | Disposition: A | Discharge status: Home / Self Care | Payer: Medicare Other | Attending: Internal Medicine | Admitting: Internal Medicine

## 2013-08-17 DIAGNOSIS — F411 Generalized anxiety disorder: Secondary | ICD-10-CM | POA: Diagnosis present

## 2013-08-17 DIAGNOSIS — Z79899 Other long term (current) drug therapy: Secondary | ICD-10-CM | POA: Diagnosis not present

## 2013-08-17 DIAGNOSIS — K56609 Unspecified intestinal obstruction, unspecified as to partial versus complete obstruction: Principal | ICD-10-CM | POA: Diagnosis present

## 2013-08-17 DIAGNOSIS — Z8249 Family history of ischemic heart disease and other diseases of the circulatory system: Secondary | ICD-10-CM | POA: Diagnosis not present

## 2013-08-17 DIAGNOSIS — F3289 Other specified depressive episodes: Secondary | ICD-10-CM | POA: Diagnosis present

## 2013-08-17 DIAGNOSIS — K219 Gastro-esophageal reflux disease without esophagitis: Secondary | ICD-10-CM | POA: Diagnosis present

## 2013-08-17 DIAGNOSIS — R1084 Generalized abdominal pain: Secondary | ICD-10-CM | POA: Diagnosis not present

## 2013-08-17 DIAGNOSIS — F329 Major depressive disorder, single episode, unspecified: Secondary | ICD-10-CM | POA: Diagnosis present

## 2013-08-17 DIAGNOSIS — J438 Other emphysema: Secondary | ICD-10-CM | POA: Diagnosis present

## 2013-08-17 DIAGNOSIS — C61 Malignant neoplasm of prostate: Secondary | ICD-10-CM | POA: Diagnosis not present

## 2013-08-17 DIAGNOSIS — Z87891 Personal history of nicotine dependence: Secondary | ICD-10-CM | POA: Diagnosis not present

## 2013-08-17 DIAGNOSIS — E785 Hyperlipidemia, unspecified: Secondary | ICD-10-CM | POA: Diagnosis not present

## 2013-08-17 DIAGNOSIS — E119 Type 2 diabetes mellitus without complications: Secondary | ICD-10-CM | POA: Diagnosis present

## 2013-08-17 DIAGNOSIS — I2510 Atherosclerotic heart disease of native coronary artery without angina pectoris: Secondary | ICD-10-CM | POA: Diagnosis present

## 2013-08-17 DIAGNOSIS — Z951 Presence of aortocoronary bypass graft: Secondary | ICD-10-CM | POA: Diagnosis not present

## 2013-08-17 DIAGNOSIS — J45909 Unspecified asthma, uncomplicated: Secondary | ICD-10-CM | POA: Diagnosis present

## 2013-08-17 DIAGNOSIS — R109 Unspecified abdominal pain: Secondary | ICD-10-CM

## 2013-08-17 DIAGNOSIS — K56600 Partial intestinal obstruction, unspecified as to cause: Secondary | ICD-10-CM | POA: Diagnosis present

## 2013-08-17 DIAGNOSIS — G4733 Obstructive sleep apnea (adult) (pediatric): Secondary | ICD-10-CM | POA: Diagnosis present

## 2013-08-17 DIAGNOSIS — I10 Essential (primary) hypertension: Secondary | ICD-10-CM | POA: Diagnosis present

## 2013-08-17 DIAGNOSIS — R141 Gas pain: Secondary | ICD-10-CM | POA: Diagnosis not present

## 2013-08-17 DIAGNOSIS — K429 Umbilical hernia without obstruction or gangrene: Secondary | ICD-10-CM | POA: Diagnosis present

## 2013-08-17 DIAGNOSIS — E7800 Pure hypercholesterolemia, unspecified: Secondary | ICD-10-CM | POA: Diagnosis present

## 2013-08-17 DIAGNOSIS — M129 Arthropathy, unspecified: Secondary | ICD-10-CM | POA: Diagnosis present

## 2013-08-17 DIAGNOSIS — Z01810 Encounter for preprocedural cardiovascular examination: Secondary | ICD-10-CM | POA: Diagnosis not present

## 2013-08-17 DIAGNOSIS — R112 Nausea with vomiting, unspecified: Secondary | ICD-10-CM | POA: Diagnosis not present

## 2013-08-17 DIAGNOSIS — Z7902 Long term (current) use of antithrombotics/antiplatelets: Secondary | ICD-10-CM

## 2013-08-17 DIAGNOSIS — R1110 Vomiting, unspecified: Secondary | ICD-10-CM | POA: Diagnosis not present

## 2013-08-17 LAB — COMPREHENSIVE METABOLIC PANEL
ALT: 18 U/L (ref 0–53)
AST: 18 U/L (ref 0–37)
CO2: 33 mEq/L — ABNORMAL HIGH (ref 19–32)
Chloride: 97 mEq/L (ref 96–112)
GFR calc Af Amer: >90 mL/min (ref >90)
GFR calc non Af Amer: 85 mL/min — ABNORMAL LOW (ref >90)
Glucose, Bld: 154 mg/dL — ABNORMAL HIGH (ref 70–99)
Sodium: 138 mEq/L (ref 135–145)
Total Bilirubin: 0.5 mg/dL (ref 0.3–1.2)

## 2013-08-17 LAB — CBC WITH DIFFERENTIAL/PLATELET
Basophils Absolute: 0 10*3/uL (ref 0.0–0.1)
Eosinophils Relative: 1 % (ref 0–5)
HCT: 40.4 % (ref 39.0–52.0)
Lymphocytes Relative: 13 % (ref 12–46)
Lymphs Abs: 1.1 10*3/uL (ref 0.7–4.0)
MCHC: 36.4 g/dL — ABNORMAL HIGH (ref 30.0–36.0)
MCV: 86.9 fL (ref 78.0–100.0)
Monocytes Absolute: 0.9 10*3/uL (ref 0.1–1.0)
Monocytes Relative: 10 % (ref 3–12)
Neutro Abs: 6.5 10*3/uL (ref 1.7–7.7)
Platelets: 166 10*3/uL (ref 150–400)
RBC: 4.65 MIL/uL (ref 4.22–5.81)
RDW: 13.3 % (ref 11.5–15.5)
WBC: 8.6 10*3/uL (ref 4.0–10.5)

## 2013-08-17 LAB — URINALYSIS, ROUTINE W REFLEX MICROSCOPIC
Bilirubin Urine: NEGATIVE
Glucose, UA: NEGATIVE mg/dL
Hgb urine dipstick: NEGATIVE
Ketones, ur: NEGATIVE mg/dL
Protein, ur: NEGATIVE mg/dL
Urobilinogen, UA: 1 mg/dL (ref 0.0–1.0)
pH: 7 (ref 5.0–8.0)

## 2013-08-17 MED ORDER — SODIUM CHLORIDE 0.9 % IV SOLN
Freq: Once | INTRAVENOUS | Status: AC
  Administered 2013-08-17: 10:00:00 via INTRAVENOUS

## 2013-08-17 MED ORDER — NITROGLYCERIN 0.3 MG SL SUBL
0.3000 mg | SUBLINGUAL_TABLET | SUBLINGUAL | Status: DC | PRN
  Filled 2013-08-17: qty 100

## 2013-08-17 MED ORDER — RANOLAZINE ER 500 MG PO TB12
500.0000 mg | ORAL_TABLET | Freq: Two times a day (BID) | ORAL | Status: DC
  Administered 2013-08-17 – 2013-08-21 (×8): 500 mg via ORAL
  Filled 2013-08-17 (×9): qty 1

## 2013-08-17 MED ORDER — NITROGLYCERIN 0.4 MG SL SUBL: 0.4000 mg | SUBLINGUAL_TABLET | SUBLINGUAL | Status: DC | PRN

## 2013-08-17 MED ORDER — ONDANSETRON HCL 4 MG/2ML IJ SOLN
4.0000 mg | Freq: Four times a day (QID) | INTRAMUSCULAR | Status: DC | PRN
  Administered 2013-08-18 – 2013-08-19 (×3): 4 mg via INTRAVENOUS
  Filled 2013-08-17: qty 2

## 2013-08-17 MED ORDER — ONDANSETRON HCL 4 MG/2ML IJ SOLN
4.0000 mg | Freq: Four times a day (QID) | INTRAMUSCULAR | Status: DC | PRN
  Administered 2013-08-19: 4 mg via INTRAVENOUS
  Filled 2013-08-17 (×3): qty 2

## 2013-08-17 MED ORDER — ACETAMINOPHEN 650 MG RE SUPP: 650.0000 mg | Freq: Four times a day (QID) | RECTAL | Status: DC | PRN

## 2013-08-17 MED ORDER — MORPHINE SULFATE 4 MG/ML IJ SOLN
4.0000 mg | Freq: Once | INTRAMUSCULAR | Status: AC
  Administered 2013-08-17: 4 mg via INTRAVENOUS
  Filled 2013-08-17: qty 1

## 2013-08-17 MED ORDER — KCL IN DEXTROSE-NACL 20-5-0.45 MEQ/L-%-% IV SOLN
INTRAVENOUS | Status: DC
  Administered 2013-08-17 – 2013-08-21 (×8): via INTRAVENOUS
  Filled 2013-08-17 (×12): qty 1000

## 2013-08-17 MED ORDER — ONDANSETRON HCL 4 MG PO TABS: 4.0000 mg | ORAL_TABLET | Freq: Four times a day (QID) | ORAL | Status: DC | PRN

## 2013-08-17 MED ORDER — METOPROLOL TARTRATE 50 MG PO TABS
50.0000 mg | ORAL_TABLET | Freq: Two times a day (BID) | ORAL | Status: DC
  Administered 2013-08-17 – 2013-08-21 (×8): 50 mg via ORAL
  Filled 2013-08-17 (×9): qty 1

## 2013-08-17 MED ORDER — CLOPIDOGREL BISULFATE 75 MG PO TABS
75.0000 mg | ORAL_TABLET | Freq: Every day | ORAL | Status: DC
  Administered 2013-08-17 – 2013-08-18 (×2): 75 mg via ORAL
  Filled 2013-08-17 (×2): qty 1

## 2013-08-17 MED ORDER — ONDANSETRON HCL 4 MG/2ML IJ SOLN
4.0000 mg | Freq: Once | INTRAMUSCULAR | Status: AC
  Administered 2013-08-17: 4 mg via INTRAVENOUS
  Filled 2013-08-17: qty 2

## 2013-08-17 MED ORDER — MORPHINE SULFATE 2 MG/ML IJ SOLN
1.0000 mg | INTRAMUSCULAR | Status: DC | PRN
  Administered 2013-08-18 (×2): 1 mg via INTRAVENOUS
  Administered 2013-08-19 – 2013-08-20 (×5): 2 mg via INTRAVENOUS
  Filled 2013-08-17 (×7): qty 1

## 2013-08-17 MED ORDER — SODIUM CHLORIDE 0.9 % IV SOLN
INTRAVENOUS | Status: DC
  Administered 2013-08-17: 16:00:00 via INTRAVENOUS

## 2013-08-17 MED ORDER — IOHEXOL 300 MG/ML  SOLN
25.0000 mL | INTRAMUSCULAR | Status: AC
  Administered 2013-08-17: 25 mL via ORAL

## 2013-08-17 MED ORDER — ENOXAPARIN SODIUM 40 MG/0.4ML ~~LOC~~ SOLN
40.0000 mg | SUBCUTANEOUS | Status: DC
  Administered 2013-08-17 – 2013-08-20 (×4): 40 mg via SUBCUTANEOUS
  Filled 2013-08-17 (×5): qty 0.4

## 2013-08-17 MED ORDER — ACETAMINOPHEN 325 MG PO TABS: 650.0000 mg | ORAL_TABLET | Freq: Four times a day (QID) | ORAL | Status: DC | PRN

## 2013-08-17 MED ORDER — IOHEXOL 300 MG/ML  SOLN
100.0000 mL | Freq: Once | INTRAMUSCULAR | Status: AC | PRN
  Administered 2013-08-17: 100 mL via INTRAVENOUS

## 2013-08-17 NOTE — ED Notes (Signed)
Pt discharged from hospital yesterday, abdominal pain, nausea and vomiting has continued. Pt told he had possible bowel obstruction on last admission.

## 2013-08-17 NOTE — ED Provider Notes (Signed)
CSN: [id number]     Arrival date & time 08/17/13  4098 History   First MD Initiated Contact with Patient 08/17/13 [phone]     Chief Complaint  Patient presents with  . Abdominal Pain  . Emesis   (Consider location/radiation/quality/duration/timing/severity/associated sxs/prior Treatment) The history is provided by the patient.   Patient discharged from the hospital yesterday after admission for nausea vomiting and diarrhea presents today with continued vomiting, abdominal distention, abdominal and back pain. While patient was in the hospital he had a KUB abdominal film that noted small bowel obstruction. As patient was tolerating by mouth at the time his diet was advanced and he was discharged home patient and wife states that he kept the food given to him at [facility] town for approximately 4 hours and has since been vomiting. He no longer has diarrhea but has been able to pass flatus. Also notes significant abdominal distention. Denies fevers, chest pain, shortness of breath, leg swelling. Patient has taken Zofran without relief. He does have a past abdominal surgical history of ventral and bilateral inguinal hernia repairs appear Past Medical History  Diagnosis Date  . Hypertension   . Emphysema   . Diabetes   . Hypercholesteremia   . PONV (postoperative nausea and vomiting)   . Angina at rest   . Coronary artery disease     recently started seeing Dr. Tresa Endo   . Arthritis   . GERD (gastroesophageal reflux disease)   . Elevated PSA   . Sleep apnea     c pap with oxygen at 2.5 L  . Prostate cancer 05/23/13    gleason 3+4=7, volume 31 cc  . Asthma   . Depression   . Anxiety    Past Surgical History  Procedure Laterality Date  . Heart bypass  2006    Quad  . Degloving rt leg injury  2000    had skin grafts  . Hernia repair  2008    x 3 w/mesh, bilat inguinla, umbilical  . Back surgery  2006    discectomy lumbar  . Tonsillectomy      w/adenoidectomy  . Prostate biopsy N/A  05/23/2013    Procedure: BIOPSY TRANSRECTAL ULTRASONIC PROSTATE (TUBP);  Surgeon: Milford Cage, MD;  Location: [facility];  Service: Urology;  Laterality: N/A;  prostate nerve block  . Back surgery    . Foot surgery    . Back surgery     Family History  Problem Relation Age of Onset  . Heart disease Mother   . Heart attack Mother   . Cancer Sister   . Depression Sister    History  Substance Use Topics  . Smoking status: Former Smoker -- 1.00 packs/day for 40 years    Types: Cigarettes    Quit date: 01/17/1999  . Smokeless tobacco: Never Used  . Alcohol Use: Yes     Comment: 1 beer weekly    Review of Systems  Constitutional: Negative for fever.  Respiratory: Negative for shortness of breath.   Cardiovascular: Negative for chest pain.  Gastrointestinal: Positive for nausea, vomiting, abdominal pain and abdominal distention. Negative for diarrhea.  Musculoskeletal: Positive for back pain.  Neurological: Positive for weakness (generalized).    Allergies  Review of patient's allergies indicates no known allergies.  Home Medications   Current Outpatient Rx  Name  Route  Sig  Dispense  Refill  . albuterol (PROVENTIL HFA;VENTOLIN HFA) 108 (90 BASE) MCG/ACT inhaler   Inhalation   Inhale 2 puffs into the  lungs every 6 (six) hours as needed for wheezing.   1 Inhaler   3   . ALPRAZolam (XANAX) 0.25 MG tablet   Oral   Take 0.25 mg by mouth daily.         . B Complex-C (B-COMPLEX WITH VITAMIN C) tablet   Oral   Take 1 tablet by mouth daily.         . Biotin 5000 MCG TABS   Oral   Take 2 capsules by mouth daily.         . clopidogrel (PLAVIX) 75 MG tablet   Oral   Take 1 tablet by mouth daily.         . DULoxetine (CYMBALTA) 60 MG capsule   Oral   Take 60 mg by mouth daily.         Marland Kitchen ezetimibe (ZETIA) 10 MG tablet   Oral   Take 10 mg by mouth every morning.         . Flaxseed, Linseed, (FLAXSEED OIL) 1200 MG CAPS   Oral   Take 1 capsule by  mouth daily.         . Melatonin 10 MG CAPS   Oral   Take 10 mg by mouth daily.         . metoprolol (LOPRESSOR) 50 MG tablet   Oral   Take 50 mg by mouth 2 (two) times daily.         . niacin (NIASPAN) 1000 MG CR tablet   Oral   Take 1 tablet (1,000 mg total) by mouth at bedtime.   30 tablet   6   . nitroGLYCERIN (NITROSTAT) 0.3 MG SL tablet   Sublingual   Place 0.3 mg under the tongue every 5 (five) minutes as needed for chest pain.         Marland Kitchen omega-3 acid ethyl esters (LOVAZA) 1 G capsule               . ondansetron (ZOFRAN-ODT) 4 MG disintegrating tablet   Oral   Take 4 mg by mouth every 6 (six) hours as needed.         . ranitidine (ZANTAC) 150 MG tablet   Oral   Take 150 mg by mouth 2 (two) times daily.         . ranolazine (RANEXA) 500 MG 12 hr tablet   Oral   Take 1 tablet (500 mg total) by mouth 2 (two) times daily.   60 tablet   6   . rosuvastatin (CRESTOR) 20 MG tablet   Oral   Take 1 tablet (20 mg total) by mouth daily.   30 tablet   6   . vardenafil (LEVITRA) 20 MG tablet   Oral   Take 20 mg by mouth daily as needed for erectile dysfunction.         . vitamin E 1000 UNIT capsule   Oral   Take 1,000 Units by mouth daily.          BP 144/80  Pulse 97  Temp(Src) 98.2 F (36.8 C) (Oral)  Resp 20  SpO2 97% Physical Exam  Nursing note and vitals reviewed. Constitutional: He appears well-developed and well-nourished. No distress.  HENT:  Head: Normocephalic and atraumatic.  Neck: Neck supple.  Cardiovascular: Normal rate and regular rhythm.   Pulmonary/Chest: Effort normal and breath sounds normal. No respiratory distress. He has no wheezes. He has no rales.  Abdominal: Soft. Bowel sounds are normal. He exhibits distension. He  exhibits no mass. There is generalized tenderness. There is no rebound and no guarding.  Musculoskeletal: He exhibits no edema.  Neurological: He is alert. He exhibits normal muscle tone.  Skin: He is  not diaphoretic.    ED Course  Procedures (including critical care time) Labs Review Labs Reviewed  CBC WITH DIFFERENTIAL - Abnormal; Notable for the following:    MCHC 36.4 (*)    All other components within normal limits  COMPREHENSIVE METABOLIC PANEL - Abnormal; Notable for the following:    Potassium 3.4 (*)    CO2 33 (*)    Glucose, Bld 154 (*)    Alkaline Phosphatase 37 (*)    GFR calc non Af Amer 85 (*)    All other components within normal limits  LIPASE, BLOOD  URINALYSIS, ROUTINE W REFLEX MICROSCOPIC   Imaging Review Ct Abdomen Pelvis W Contrast  08/17/2013   CLINICAL DATA:  Abdominal pain, vomiting, question small bowel obstruction.  EXAM: CT ABDOMEN AND PELVIS WITH CONTRAST  TECHNIQUE: Multidetector CT imaging of the abdomen and pelvis was performed using the standard protocol following bolus administration of intravenous contrast.  CONTRAST:  OMNIPAQUE IOHEXOL 300 MG/ML  SOLN  COMPARISON:  08/15/2013  FINDINGS: Linear densities in the lung bases, likely scarring or atelectasis. Heart is normal size. No pleural effusions.  Liver, gallbladder, spleen, pancreas, adrenals and kidneys are unremarkable.  There are dilated small bowel loops in the abdomen and pelvis with air-fluid levels compatible with small bowel obstruction. Small amount of perihepatic and mesenteric free fluid. The transition point appears to be in the right lower quadrant on image 69 without visible obstructing lesion, question adhesions.  Scattered colonic diverticula. No active diverticulitis. No free air or adenopathy.  Small left lateral bladder wall diverticulum. Aorta and iliac vessels are calcified. Slight aneurysmal dilatation of the infrarenal abdominal aorta measuring maximally 3.0 cm.  No acute bony abnormality. Degenerative changes in the thoracolumbar spine.  IMPRESSION: Small bowel obstruction which appears partial, mid to high-grade. The transition point is noted in the right lower quadrant  without visible obstructing process, question adhesions. Small amount of free fluid in the mesenteric and adjacent to the liver.   Electronically Signed   By: Charlett Nose M.D.   On: 08/17/2013 11:55   Acute Abdominal Series  08/15/2013   CLINICAL DATA:  Nausea and vomiting with diarrhea for 4 days.  EXAM: ACUTE ABDOMEN SERIES (ABDOMEN 2 VIEW & CHEST 1 VIEW)  COMPARISON:  Chest radiograph 11/23/2012.  FINDINGS: Median sternotomy/ CABG. Prominent left-sided pericardial fat pad. Scattered areas of atelectasis and and scarring. No free air underneath the hemidiaphragms. Dilated loops of small bowel are present, with a paucity of rectal gas and stool. The appearance is compatible with small bowel obstruction. Mural thickening of dilated small bowel loops. Maximal diameter of small bowel is 4.6 cm. Fluid levels are present on the upright view. There is no plain film evidence of free air. Ventral hernia tacks are present in the central abdomen.  IMPRESSION: Findings consistent with small bowel obstruction. Enteritis is considered less likely. Postoperative changes of ventral hernia repair.   Electronically Signed   By: Andreas Newport M.D.   On: 08/15/2013 20:56    EKG Interpretation   None      9:53 AM Discussed pt with Dr Elesa Massed.  1:43 PM Dr Waynard Edwards made aware of the patient.   I spoke with Aris Georgia, PA-C, general surgery, who will see the patient.  General surgery to consult  per Dr Elesa Massed who also spoke with Dr Waynard Edwards regarding admission.   MDM   1. SBO (small bowel obstruction)     Patient with 6 days of abdominal pain, nausea vomiting diarrhea, admitted to the hospital and discharged yesterday, presents with worsening nausea vomiting, abdominal distention and pain.  CT abd/pelvis shows partial SBO with transition in RLQ.  Dr Waynard Edwards of [facility] made aware of the patient, general surgery asked to admit the patient and they declined, Dr Waynard Edwards to come see and admit the patient.      Trixie Dredge, PA-C 08/17/13 1542

## 2013-08-17 NOTE — Consult Note (Signed)
Daniel Singleton 03/22/1941  [id number].    Requesting MD: Dr. Elesa Massed Chief Complaint/Reason for Consult: SBO  HPI:  72 y/o male who was recently admitted on 08/15/13 and discharged yesterday with likely gastroenteritis.  He was noted to have dehydration from N/V/D.  He was discharged on 08/16/13 after improvement with rehydration and he was able to tolerate PO.  He has returned today to the [facility] for abdominal pain distension, and N/V.  Abdominal pain radiates to his back.  No fevers/chills, chest pain, SOB.  Took zofran without relief.  H/o machinary trauma where he herniated his umbilical hernia which was repaired laparoscopically in [place].  He also had b/l inguinal hernias repaired.  H/o Quadruple bypass and prostate cancer with recent reservations about surgery because of risks of anesthesia.  Wife and son at bedside giving most of history due to patients current state.  ROS: All systems reviewed and otherwise negative except for as above  Family History  Problem Relation Age of Onset  . Heart disease Mother   . Heart attack Mother   . Cancer Sister   . Depression Sister     Past Medical History  Diagnosis Date  . Hypertension   . Emphysema   . Diabetes   . Hypercholesteremia   . PONV (postoperative nausea and vomiting)   . Angina at rest   . Coronary artery disease     recently started seeing Dr. Tresa Endo   . Arthritis   . GERD (gastroesophageal reflux disease)   . Elevated PSA   . Sleep apnea     c pap with oxygen at 2.5 L  . Prostate cancer 05/23/13    gleason 3+4=7, volume 31 cc  . Asthma   . Depression   . Anxiety     Past Surgical History  Procedure Laterality Date  . Heart bypass  2006    Quad  . Degloving rt leg injury  2000    had skin grafts  . Hernia repair  2008    x 3 w/mesh, bilat inguinla, umbilical  . Back surgery  2006    discectomy lumbar  . Tonsillectomy      w/adenoidectomy  . Prostate biopsy N/A 05/23/2013    Procedure: BIOPSY TRANSRECTAL  ULTRASONIC PROSTATE (TUBP);  Surgeon: Milford Cage, MD;  Location: [facility];  Service: Urology;  Laterality: N/A;  prostate nerve block  . Back surgery    . Foot surgery    . Back surgery      Social History:  reports that he quit smoking about 14 years ago. His smoking use included Cigarettes. He has a 40 pack-year smoking history. He has never used smokeless tobacco. He reports that he drinks alcohol. He reports that he does not use illicit drugs.  Allergies: No Known Allergies   (Not in a hospital admission)  Blood pressure 144/80, pulse 97, temperature 98.2 F (36.8 C), temperature source Oral, resp. rate 20, SpO2 97.00%. Physical Exam: General: pleasant, WD/WN white male who is laying in bed in NAD HEENT: head is normocephalic, atraumatic.  Sclera are noninjected.  PERRL.  Ears and nose without any masses or lesions.  Mouth is pink and moist Heart: regular, rate, and rhythm.  No obvious murmurs, gallops, or rubs noted.  Palpable pedal pulses bilaterally Lungs: CTAB, no wheezes, rhonchi, or rales noted.  Respiratory effort nonlabored Abd: soft, distended and tympanic, moderately tender diffusely, +BS, no masses, or organomegaly, umbilical hernia is reducible and soft MS: right LE deformity from  trauma, intact distal pulses b/l to UE/LE Skin: warm and dry with no masses, lesions, or rashes Psych: A&Ox3 with an appropriate affect.  Results for orders placed during the hospital encounter of 08/17/13 (from the past 48 hour(s))  CBC WITH DIFFERENTIAL     Status: Abnormal   Collection Time    08/17/13  9:40 AM      Result Value Range   WBC 8.6  4.0 - 10.5 K/uL   RBC 4.65  4.22 - 5.81 MIL/uL   Hemoglobin 14.7  13.0 - 17.0 g/dL   Comment: DELTA CHECK NOTED     REPEATED TO VERIFY   HCT 40.4  39.0 - 52.0 %   MCV 86.9  78.0 - 100.0 fL   MCH 31.6  26.0 - 34.0 pg   MCHC 36.4 (*) 30.0 - 36.0 g/dL   RDW 16.1  09.6 - 04.5 %   Platelets 166  150 - 400 K/uL   Neutrophils Relative %  76  43 - 77 %   Neutro Abs 6.5  1.7 - 7.7 K/uL   Lymphocytes Relative 13  12 - 46 %   Lymphs Abs 1.1  0.7 - 4.0 K/uL   Monocytes Relative 10  3 - 12 %   Monocytes Absolute 0.9  0.1 - 1.0 K/uL   Eosinophils Relative 1  0 - 5 %   Eosinophils Absolute 0.1  0.0 - 0.7 K/uL   Basophils Relative 0  0 - 1 %   Basophils Absolute 0.0  0.0 - 0.1 K/uL  COMPREHENSIVE METABOLIC PANEL     Status: Abnormal   Collection Time    08/17/13  9:40 AM      Result Value Range   Sodium 138  135 - 145 mEq/L   Potassium 3.4 (*) 3.5 - 5.1 mEq/L   Chloride 97  96 - 112 mEq/L   CO2 33 (*) 19 - 32 mEq/L   Glucose, Bld 154 (*) 70 - 99 mg/dL   BUN 11  6 - 23 mg/dL   Creatinine, Ser 4.09  0.50 - 1.35 mg/dL   Calcium 8.9  8.4 - 81.1 mg/dL   Total Protein 6.8  6.0 - 8.3 g/dL   Albumin 3.5  3.5 - 5.2 g/dL   AST 18  0 - 37 U/L   ALT 18  0 - 53 U/L   Alkaline Phosphatase 37 (*) 39 - 117 U/L   Total Bilirubin 0.5  0.3 - 1.2 mg/dL   GFR calc non Af Amer 85 (*) >90 mL/min   GFR calc Af Amer >90  >90 mL/min   Comment: (NOTE)     The eGFR has been calculated using the CKD EPI equation.     This calculation has not been validated in all clinical situations.     eGFR's persistently <90 mL/min signify possible Chronic Kidney     Disease.  LIPASE, BLOOD     Status: None   Collection Time    08/17/13  9:40 AM      Result Value Range   Lipase 23  11 - 59 U/L  URINALYSIS, ROUTINE W REFLEX MICROSCOPIC     Status: None   Collection Time    08/17/13 11:55 AM      Result Value Range   Color, Urine YELLOW  YELLOW   APPearance CLEAR  CLEAR   Specific Gravity, Urine 1.029  1.005 - 1.030   pH 7.0  5.0 - 8.0   Glucose, UA NEGATIVE  NEGATIVE mg/dL  Hgb urine dipstick NEGATIVE  NEGATIVE   Bilirubin Urine NEGATIVE  NEGATIVE   Ketones, ur NEGATIVE  NEGATIVE mg/dL   Protein, ur NEGATIVE  NEGATIVE mg/dL   Urobilinogen, UA 1.0  0.0 - 1.0 mg/dL   Nitrite NEGATIVE  NEGATIVE   Leukocytes, UA NEGATIVE  NEGATIVE   Comment:  MICROSCOPIC NOT DONE ON URINES WITH NEGATIVE PROTEIN, BLOOD, LEUKOCYTES, NITRITE, OR GLUCOSE <1000 mg/dL.   Ct Abdomen Pelvis W Contrast  08/17/2013   CLINICAL DATA:  Abdominal pain, vomiting, question small bowel obstruction.  EXAM: CT ABDOMEN AND PELVIS WITH CONTRAST  TECHNIQUE: Multidetector CT imaging of the abdomen and pelvis was performed using the standard protocol following bolus administration of intravenous contrast.  CONTRAST:  OMNIPAQUE IOHEXOL 300 MG/ML  SOLN  COMPARISON:  08/15/2013  FINDINGS: Linear densities in the lung bases, likely scarring or atelectasis. Heart is normal size. No pleural effusions.  Liver, gallbladder, spleen, pancreas, adrenals and kidneys are unremarkable.  There are dilated small bowel loops in the abdomen and pelvis with air-fluid levels compatible with small bowel obstruction. Small amount of perihepatic and mesenteric free fluid. The transition point appears to be in the right lower quadrant on image 69 without visible obstructing lesion, question adhesions.  Scattered colonic diverticula. No active diverticulitis. No free air or adenopathy.  Small left lateral bladder wall diverticulum. Aorta and iliac vessels are calcified. Slight aneurysmal dilatation of the infrarenal abdominal aorta measuring maximally 3.0 cm.  No acute bony abnormality. Degenerative changes in the thoracolumbar spine.  IMPRESSION: Small bowel obstruction which appears partial, mid to high-grade. The transition point is noted in the right lower quadrant without visible obstructing process, question adhesions. Small amount of free fluid in the mesenteric and adjacent to the liver.   Electronically Signed   By: Charlett Nose M.D.   On: 08/17/2013 11:55   Acute Abdominal Series  08/15/2013   CLINICAL DATA:  Nausea and vomiting with diarrhea for 4 days.  EXAM: ACUTE ABDOMEN SERIES (ABDOMEN 2 VIEW & CHEST 1 VIEW)  COMPARISON:  Chest radiograph 11/23/2012.  FINDINGS: Median sternotomy/ CABG.  Prominent left-sided pericardial fat pad. Scattered areas of atelectasis and and scarring. No free air underneath the hemidiaphragms. Dilated loops of small bowel are present, with a paucity of rectal gas and stool. The appearance is compatible with small bowel obstruction. Mural thickening of dilated small bowel loops. Maximal diameter of small bowel is 4.6 cm. Fluid levels are present on the upright view. There is no plain film evidence of free air. Ventral hernia tacks are present in the central abdomen.  IMPRESSION: Findings consistent with small bowel obstruction. Enteritis is considered less likely. Postoperative changes of ventral hernia repair.   Electronically Signed   By: Andreas Newport M.D.   On: 08/15/2013 20:56       Assessment/Plan Small bowel obstruction Nausea and vomiting Abdominal pain/distension  Plan: 1.  Admit to [facility] medical.  The patient is not in need of immediate surgical intervention and given his chronic medical conditions our service does not feel comfortable managing this potentially non-surgical patient. 2.  Conservative management including NG tube, IVF, pain control, antiemetics, bowel rest 3.  Ambulate and IS 4.  SCD's and dvt proph per primary team 5.  Would give this a few days to resolve on its own and if he worsens or he does not resolve may need surgical intervention at some point.  Would need cardiology consult for pre-op clearance if it came to that.  Dr.  Tresa Endo is his cardiologist.     Elmon Else, Aundra Millet 08/17/2013, 2:23 PM Pager: [phone]

## 2013-08-17 NOTE — H&P (Signed)
Daniel Singleton is an 72 y.o. male.   Chief Complaint: bowel obstruction HPI: 72 yo male who was in his usual state of health until this week.  His symptoms started 4-5 days ago.  Felt like a stomach bug initially.  Throwing up.   Last bm was  08/15/13, some loose stool several times that day. He was admitted christmas eve with psbo.  But, he seemed to improve. He ate the next day and felt better so he was discharged on 08/16/13.  He presented today with recurrence of sxs.  Nausea and vomiting.  He came back to the ER and he is found to have psbo.  Surgery has asked for medical admission and they will follow along.  No fever.  No blood noted above or below.  He did take one ntg 4-5 days ago.    Past Medical History  Diagnosis Date  . Hypertension   . Emphysema   . Diabetes   . Hypercholesteremia   . PONV (postoperative nausea and vomiting)   . Angina at rest   . Coronary artery disease     recently started seeing Dr. Tresa Endo   . Arthritis   . GERD (gastroesophageal reflux disease)   . Elevated PSA   . Sleep apnea     c pap with oxygen at 2.5 L  . Prostate cancer 05/23/13    gleason 3+4=7, volume 31 cc  . Asthma   . Depression   . Anxiety     Past Surgical History  Procedure Laterality Date  . Heart bypass  2006    Quad  . Degloving rt leg injury  2000    had skin grafts  . Hernia repair  2008    x 3 w/mesh, bilat inguinla, umbilical  . Back surgery  2006    discectomy lumbar  . Tonsillectomy      w/adenoidectomy  . Prostate biopsy N/A 05/23/2013    Procedure: BIOPSY TRANSRECTAL ULTRASONIC PROSTATE (TUBP);  Surgeon: Milford Cage, MD;  Location: [facility];  Service: Urology;  Laterality: N/A;  prostate nerve block  . Back surgery    . Foot surgery    . Back surgery      Family History  Problem Relation Age of Onset  . Heart disease Mother   . Heart attack Mother   . Cancer Sister   . Depression Sister    Social History:  reports that he quit smoking about 14 years  ago. His smoking use included Cigarettes. He has a 40 pack-year smoking history. He has never used smokeless tobacco. He reports that he drinks alcohol. He reports that he does not use illicit drugs.  Married: [name].  Children 2, GC 3.  Allergies: No Known Allergies  Home meds:  See med rec.  Results for orders placed during the hospital encounter of 08/17/13 (from the past 48 hour(s))  CBC WITH DIFFERENTIAL     Status: Abnormal   Collection Time    08/17/13  9:40 AM      Result Value Range   WBC 8.6  4.0 - 10.5 K/uL   RBC 4.65  4.22 - 5.81 MIL/uL   Hemoglobin 14.7  13.0 - 17.0 g/dL   Comment: DELTA CHECK NOTED     REPEATED TO VERIFY   HCT 40.4  39.0 - 52.0 %   MCV 86.9  78.0 - 100.0 fL   MCH 31.6  26.0 - 34.0 pg   MCHC 36.4 (*) 30.0 - 36.0 g/dL  RDW 13.3  11.5 - 15.5 %   Platelets 166  150 - 400 K/uL   Neutrophils Relative % 76  43 - 77 %   Neutro Abs 6.5  1.7 - 7.7 K/uL   Lymphocytes Relative 13  12 - 46 %   Lymphs Abs 1.1  0.7 - 4.0 K/uL   Monocytes Relative 10  3 - 12 %   Monocytes Absolute 0.9  0.1 - 1.0 K/uL   Eosinophils Relative 1  0 - 5 %   Eosinophils Absolute 0.1  0.0 - 0.7 K/uL   Basophils Relative 0  0 - 1 %   Basophils Absolute 0.0  0.0 - 0.1 K/uL  COMPREHENSIVE METABOLIC PANEL     Status: Abnormal   Collection Time    08/17/13  9:40 AM      Result Value Range   Sodium 138  135 - 145 mEq/L   Potassium 3.4 (*) 3.5 - 5.1 mEq/L   Chloride 97  96 - 112 mEq/L   CO2 33 (*) 19 - 32 mEq/L   Glucose, Bld 154 (*) 70 - 99 mg/dL   BUN 11  6 - 23 mg/dL   Creatinine, Ser 1.61  0.50 - 1.35 mg/dL   Calcium 8.9  8.4 - 09.6 mg/dL   Total Protein 6.8  6.0 - 8.3 g/dL   Albumin 3.5  3.5 - 5.2 g/dL   AST 18  0 - 37 U/L   ALT 18  0 - 53 U/L   Alkaline Phosphatase 37 (*) 39 - 117 U/L   Total Bilirubin 0.5  0.3 - 1.2 mg/dL   GFR calc non Af Amer 85 (*) >90 mL/min   GFR calc Af Amer >90  >90 mL/min   Comment: (NOTE)     The eGFR has been calculated using the CKD EPI  equation.     This calculation has not been validated in all clinical situations.     eGFR's persistently <90 mL/min signify possible Chronic Kidney     Disease.  LIPASE, BLOOD     Status: None   Collection Time    08/17/13  9:40 AM      Result Value Range   Lipase 23  11 - 59 U/L  URINALYSIS, ROUTINE W REFLEX MICROSCOPIC     Status: None   Collection Time    08/17/13 11:55 AM      Result Value Range   Color, Urine YELLOW  YELLOW   APPearance CLEAR  CLEAR   Specific Gravity, Urine 1.029  1.005 - 1.030   pH 7.0  5.0 - 8.0   Glucose, UA NEGATIVE  NEGATIVE mg/dL   Hgb urine dipstick NEGATIVE  NEGATIVE   Bilirubin Urine NEGATIVE  NEGATIVE   Ketones, ur NEGATIVE  NEGATIVE mg/dL   Protein, ur NEGATIVE  NEGATIVE mg/dL   Urobilinogen, UA 1.0  0.0 - 1.0 mg/dL   Nitrite NEGATIVE  NEGATIVE   Leukocytes, UA NEGATIVE  NEGATIVE   Comment: MICROSCOPIC NOT DONE ON URINES WITH NEGATIVE PROTEIN, BLOOD, LEUKOCYTES, NITRITE, OR GLUCOSE <1000 mg/dL.   Ct Abdomen Pelvis W Contrast  08/17/2013   CLINICAL DATA:  Abdominal pain, vomiting, question small bowel obstruction.  EXAM: CT ABDOMEN AND PELVIS WITH CONTRAST  TECHNIQUE: Multidetector CT imaging of the abdomen and pelvis was performed using the standard protocol following bolus administration of intravenous contrast.  CONTRAST:  OMNIPAQUE IOHEXOL 300 MG/ML  SOLN  COMPARISON:  08/15/2013  FINDINGS: Linear densities in the lung bases, likely  scarring or atelectasis. Heart is normal size. No pleural effusions.  Liver, gallbladder, spleen, pancreas, adrenals and kidneys are unremarkable.  There are dilated small bowel loops in the abdomen and pelvis with air-fluid levels compatible with small bowel obstruction. Small amount of perihepatic and mesenteric free fluid. The transition point appears to be in the right lower quadrant on image 69 without visible obstructing lesion, question adhesions.  Scattered colonic diverticula. No active diverticulitis. No  free air or adenopathy.  Small left lateral bladder wall diverticulum. Aorta and iliac vessels are calcified. Slight aneurysmal dilatation of the infrarenal abdominal aorta measuring maximally 3.0 cm.  No acute bony abnormality. Degenerative changes in the thoracolumbar spine.  IMPRESSION: Small bowel obstruction which appears partial, mid to high-grade. The transition point is noted in the right lower quadrant without visible obstructing process, question adhesions. Small amount of free fluid in the mesenteric and adjacent to the liver.   Electronically Signed   By: Charlett Nose M.D.   On: 08/17/2013 11:55   Acute Abdominal Series  08/15/2013   CLINICAL DATA:  Nausea and vomiting with diarrhea for 4 days.  EXAM: ACUTE ABDOMEN SERIES (ABDOMEN 2 VIEW & CHEST 1 VIEW)  COMPARISON:  Chest radiograph 11/23/2012.  FINDINGS: Median sternotomy/ CABG. Prominent left-sided pericardial fat pad. Scattered areas of atelectasis and and scarring. No free air underneath the hemidiaphragms. Dilated loops of small bowel are present, with a paucity of rectal gas and stool. The appearance is compatible with small bowel obstruction. Mural thickening of dilated small bowel loops. Maximal diameter of small bowel is 4.6 cm. Fluid levels are present on the upright view. There is no plain film evidence of free air. Ventral hernia tacks are present in the central abdomen.  IMPRESSION: Findings consistent with small bowel obstruction. Enteritis is considered less likely. Postoperative changes of ventral hernia repair.   Electronically Signed   By: Andreas Newport M.D.   On: 08/15/2013 20:56    ROS:as per hpi  Blood pressure 144/80, pulse 97, temperature 98.2 F (36.8 C), temperature source Oral, resp. rate 20, SpO2 97.00%.  age approp . male. no pallor or icterus.  alert and oriented times 4. ng in place.  lungs cta bilat. no w/r/r.  heart is rrr no m/r/g. abd is distended but soft, only mildly tender diffusely.  moe times 4.  grossly nL strength. no c/c/e.    Assessment/Plan PSBO-admit. Npo. Ng tube. Hydrate with d51/2 ns with 20 kcl.  Surgery to follow. Full code status. dvt proph with lovenox.   Ezequiel Kayser, MD 08/17/2013, 4:17 PM

## 2013-08-17 NOTE — ED Provider Notes (Signed)
Medical screening examination/treatment/procedure(s) were conducted as a shared visit with non-physician practitioner(s) and myself.  I personally evaluated the patient during the encounter.  EKG Interpretation    Date/Time:  Friday August 17 2013 10:30:40 EST Ventricular Rate:  95 PR Interval:  161 QRS Duration: 80 QT Interval:  366 QTC Calculation: 460 R Axis:   67 Text Interpretation:  Sinus arrhythmia Probable left atrial enlargement Borderline T abnormalities, inferior leads Confirmed by WARD  DO, KRISTEN ([id number]) on 08/17/2013 11:26:13 AM            Pt is a 72 y.o. male with history of cardiac disease who presents emergency department with abdominal pain, distention, nausea and vomiting. Patient was recently admitted to the hospital and had a possible bowel obstruction at that time but was tolerating by mouth without difficulty. Here, patient is hemodynamically stable but appears uncomfortable with significant abdominal distention with tympany. His CT scan shows partial early small bowel obstruction. Will place NG tube and discuss with surgery. Patient will need admission.  Layla Maw Ward, DO 08/17/13 1246

## 2013-08-17 NOTE — ED Notes (Signed)
Admitting at bedside 

## 2013-08-17 NOTE — Progress Notes (Signed)
08/17/13 Patient is being admitted with small bowel obstruction. Has hx of COPD with Emphysema, Obstructive Sleep Apnea, CAD, Hyperlipidema,Abestos Exposure,Prostate CA, N/V, and DM. He has IV site in RT A/C with NS at 97ml/hr.Nasal gastric tube in rt nare to suction, o2 at 3 L/, NPO except sips with meds. Full code and no alergies known.

## 2013-08-17 NOTE — ED Notes (Signed)
Dr. Wyatt at  Bedside  

## 2013-08-18 DIAGNOSIS — Z01810 Encounter for preprocedural cardiovascular examination: Secondary | ICD-10-CM

## 2013-08-18 DIAGNOSIS — I2510 Atherosclerotic heart disease of native coronary artery without angina pectoris: Secondary | ICD-10-CM

## 2013-08-18 DIAGNOSIS — K56609 Unspecified intestinal obstruction, unspecified as to partial versus complete obstruction: Principal | ICD-10-CM

## 2013-08-18 LAB — COMPREHENSIVE METABOLIC PANEL
ALT: 19 U/L (ref 0–53)
AST: 23 U/L (ref 0–37)
CO2: 32 mEq/L (ref 19–32)
Calcium: 8.4 mg/dL (ref 8.4–10.5)
Chloride: 101 mEq/L (ref 96–112)
GFR calc non Af Amer: 86 mL/min — ABNORMAL LOW (ref >90)
Sodium: 137 mEq/L (ref 135–145)
Total Bilirubin: 0.4 mg/dL (ref 0.3–1.2)
Total Protein: 5.6 g/dL — ABNORMAL LOW (ref 6.0–8.3)

## 2013-08-18 LAB — CBC WITH DIFFERENTIAL/PLATELET
Basophils Absolute: 0 10*3/uL (ref 0.0–0.1)
Eosinophils Relative: 3 % (ref 0–5)
HCT: 35.8 % — ABNORMAL LOW (ref 39.0–52.0)
Hemoglobin: 12.8 g/dL — ABNORMAL LOW (ref 13.0–17.0)
Lymphocytes Relative: 16 % (ref 12–46)
Lymphs Abs: 1.2 10*3/uL (ref 0.7–4.0)
MCV: 86.3 fL (ref 78.0–100.0)
Monocytes Absolute: 0.8 10*3/uL (ref 0.1–1.0)
Monocytes Relative: 9 % (ref 3–12)
RBC: 4.15 MIL/uL — ABNORMAL LOW (ref 4.22–5.81)
RDW: 13.3 % (ref 11.5–15.5)
WBC: 8 10*3/uL (ref 4.0–10.5)

## 2013-08-18 NOTE — Progress Notes (Signed)
Subjective: Pt's pain is improved somewhat, but still nauseated.  Flushed NG tube and got out about 75mL more.  NG hadn't been flushed yet because NG flush was still in package.  Not ambulated OOB yet.    Objective: Vital signs in last 24 hours: Temp:  [98 F (36.7 C)-98.4 F (36.9 C)] 98 F (36.7 C) (12/27 0504) Pulse Rate:  [85-95] 85 (12/27 0504) Resp:  [18] 18 (12/27 0504) BP: (129-149)/(78-82) 129/78 mmHg (12/27 0504) SpO2:  [97 %-99 %] 97 % (12/27 0504) Weight:  [199 lb 8.3 oz (90.5 kg)-200 lb 6.4 oz (90.901 kg)] 200 lb 6.4 oz (90.901 kg) (12/27 0504) Last BM Date: 08/15/13  Intake/Output from previous day: 12/26 0701 - 12/27 0700 In: 480 [I.V.:480] Out: 1100 [Urine:1100] Intake/Output this shift: Total I/O In: 893.3 [I.V.:893.3] Out: -   PE: Gen:  Alert, NAD, pleasant Abd: Soft, mild tenderness diffusely, somewhat distended, +BS, no HSM, umbilical hernia reducible and soft  Lab Results:   Recent Labs  08/17/13 0940 08/18/13 0635  WBC 8.6 8.0  HGB 14.7 12.8*  HCT 40.4 35.8*  PLT 166 131*   BMET  Recent Labs  08/17/13 0940 08/18/13 0635  NA 138 137  K 3.4* 3.8  CL 97 101  CO2 33* 32  GLUCOSE 154* 137*  BUN 11 7  CREATININE 0.87 0.86  CALCIUM 8.9 8.4   PT/INR No results found for this basename: LABPROT, INR,  in the last 72 hours CMP     Component Value Date/Time   NA 137 08/18/2013 0635   K 3.8 08/18/2013 0635   CL 101 08/18/2013 0635   CO2 32 08/18/2013 0635   GLUCOSE 137* 08/18/2013 0635   BUN 7 08/18/2013 0635   CREATININE 0.86 08/18/2013 0635   CREATININE 1.00 05/04/2013 1624   CALCIUM 8.4 08/18/2013 0635   PROT 5.6* 08/18/2013 0635   ALBUMIN 2.7* 08/18/2013 0635   AST 23 08/18/2013 0635   ALT 19 08/18/2013 0635   ALKPHOS 31* 08/18/2013 0635   BILITOT 0.4 08/18/2013 0635   GFRNONAA 86* 08/18/2013 0635   GFRAA >90 08/18/2013 0635   Lipase     Component Value Date/Time   LIPASE 23 08/17/2013 0940        Studies/Results: Ct Abdomen Pelvis W Contrast  08/17/2013   CLINICAL DATA:  Abdominal pain, vomiting, question small bowel obstruction.  EXAM: CT ABDOMEN AND PELVIS WITH CONTRAST  TECHNIQUE: Multidetector CT imaging of the abdomen and pelvis was performed using the standard protocol following bolus administration of intravenous contrast.  CONTRAST:  OMNIPAQUE IOHEXOL 300 MG/ML  SOLN  COMPARISON:  08/15/2013  FINDINGS: Linear densities in the lung bases, likely scarring or atelectasis. Heart is normal size. No pleural effusions.  Liver, gallbladder, spleen, pancreas, adrenals and kidneys are unremarkable.  There are dilated small bowel loops in the abdomen and pelvis with air-fluid levels compatible with small bowel obstruction. Small amount of perihepatic and mesenteric free fluid. The transition point appears to be in the right lower quadrant on image 69 without visible obstructing lesion, question adhesions.  Scattered colonic diverticula. No active diverticulitis. No free air or adenopathy.  Small left lateral bladder wall diverticulum. Aorta and iliac vessels are calcified. Slight aneurysmal dilatation of the infrarenal abdominal aorta measuring maximally 3.0 cm.  No acute bony abnormality. Degenerative changes in the thoracolumbar spine.  IMPRESSION: Small bowel obstruction which appears partial, mid to high-grade. The transition point is noted in the right lower quadrant without visible obstructing process, question  adhesions. Small amount of free fluid in the mesenteric and adjacent to the liver.   Electronically Signed   By: Charlett Nose M.D.   On: 08/17/2013 11:55    Anti-infectives: Anti-infectives   None       Assessment/Plan Small bowel obstruction  Nausea and vomiting  Abdominal pain/distension  Multiple chronic medical and cardiac conditions  Plan:  1. The patient is not in need of immediate surgical intervention 2. Conservative management including NG tube, IVF,  pain control, antiemetics, bowel rest  3. Ambulate and IS  4. SCD's and dvt proph per primary team, will need to hold Plavix in case he needs surgical intervention  5. Would give this a few days to resolve on its own and if he worsens or he does not resolve may need surgical intervention in the next few days 6.  Placed consult for pre-op clearance if it comes to that - Dr. Bishop Limbo is his cardiologist. Dr. Gery Pray to see. 7.  NG output is not being recorded.  There is approximately green with food particles in the cannister currently.  Re-entered orders to closely monitor output.     LOS: 1 day    DORT, Tandra Rosado 08/18/2013, 10:15 AM Pager: [phone]

## 2013-08-18 NOTE — Progress Notes (Signed)
Subjective: No real change in status. A lot of ng drainage noted.  Surgery requests for plavix to be placed on hold so i have stopped it.  No sig . Pain now.  Apprec. Cards note/clearance.  Objective: Vital signs in last 24 hours: Temp:  [98 F (36.7 C)-98.4 F (36.9 C)] 98 F (36.7 C) (12/27 0504) Pulse Rate:  [85-95] 85 (12/27 0504) Resp:  [18] 18 (12/27 0504) BP: (129-149)/(78-82) 129/78 mmHg (12/27 0504) SpO2:  [97 %-99 %] 97 % (12/27 0504) Weight:  [90.5 kg (199 lb 8.3 oz)-90.901 kg (200 lb 6.4 oz)] 90.901 kg (200 lb 6.4 oz) (12/27 0504) Weight change:  Last BM Date: 08/15/13  Intake/Output from previous day: 12/26 0701 - 12/27 0700 In: 480 [I.V.:480] Out: 1100 [Urine:1100] Intake/Output this shift: Total I/O In: 893.3 [I.V.:893.3] Out: -   General appearance: alert, cooperative and appears stated age Resp: clear to auscultation bilaterally Cardio: regular rate and rhythm, S1, S2 normal, no murmur, click, rub or gallop GI: distended. soft, no mass.  no hsm, Extremities: extremities normal, atraumatic, no cyanosis or edema Neurologic: Grossly normal   Lab Results:  Recent Labs  08/17/13 0940 08/18/13 0635  WBC 8.6 8.0  HGB 14.7 12.8*  HCT 40.4 35.8*  PLT 166 131*   BMET  Recent Labs  08/17/13 0940 08/18/13 0635  NA 138 137  K 3.4* 3.8  CL 97 101  CO2 33* 32  GLUCOSE 154* 137*  BUN 11 7  CREATININE 0.87 0.86  CALCIUM 8.9 8.4   CMET CMP     Component Value Date/Time   NA 137 08/18/2013 0635   K 3.8 08/18/2013 0635   CL 101 08/18/2013 0635   CO2 32 08/18/2013 0635   GLUCOSE 137* 08/18/2013 0635   BUN 7 08/18/2013 0635   CREATININE 0.86 08/18/2013 0635   CREATININE 1.00 05/04/2013 1624   CALCIUM 8.4 08/18/2013 0635   PROT 5.6* 08/18/2013 0635   ALBUMIN 2.7* 08/18/2013 0635   AST 23 08/18/2013 0635   ALT 19 08/18/2013 0635   ALKPHOS 31* 08/18/2013 0635   BILITOT 0.4 08/18/2013 0635   GFRNONAA 86* 08/18/2013 0635   GFRAA >90 08/18/2013 0635      Studies/Results: Ct Abdomen Pelvis W Contrast  08/17/2013   CLINICAL DATA:  Abdominal pain, vomiting, question small bowel obstruction.  EXAM: CT ABDOMEN AND PELVIS WITH CONTRAST  TECHNIQUE: Multidetector CT imaging of the abdomen and pelvis was performed using the standard protocol following bolus administration of intravenous contrast.  CONTRAST:  OMNIPAQUE IOHEXOL 300 MG/ML  SOLN  COMPARISON:  08/15/2013  FINDINGS: Linear densities in the lung bases, likely scarring or atelectasis. Heart is normal size. No pleural effusions.  Liver, gallbladder, spleen, pancreas, adrenals and kidneys are unremarkable.  There are dilated small bowel loops in the abdomen and pelvis with air-fluid levels compatible with small bowel obstruction. Small amount of perihepatic and mesenteric free fluid. The transition point appears to be in the right lower quadrant on image 69 without visible obstructing lesion, question adhesions.  Scattered colonic diverticula. No active diverticulitis. No free air or adenopathy.  Small left lateral bladder wall diverticulum. Aorta and iliac vessels are calcified. Slight aneurysmal dilatation of the infrarenal abdominal aorta measuring maximally 3.0 cm.  No acute bony abnormality. Degenerative changes in the thoracolumbar spine.  IMPRESSION: Small bowel obstruction which appears partial, mid to high-grade. The transition point is noted in the right lower quadrant without visible obstructing process, question adhesions. Small amount of free fluid  in the mesenteric and adjacent to the liver.   Electronically Signed   By: Charlett Nose M.D.   On: 08/17/2013 11:55    Medications: I have reviewed the patient's current medications.  . enoxaparin (LOVENOX) injection  40 mg Subcutaneous Q24H  . metoprolol  50 mg Oral BID  . ranolazine  500 mg Oral BID     Assessment/Plan: psbo-plan per surgery. Active Problems:   CAD (coronary artery disease) s/p CABG x 4 in 2006-clinically  stable.  plavix on hold now.   Hyperlipidemia LDL goal < 70-follow.   Partial small bowel obstruction-see above.   LOS: 1 day   Ezequiel Kayser, MD 08/18/2013, 12:58 PM

## 2013-08-18 NOTE — Consult Note (Signed)
Reason for Consult: Pre-operative Clearance (SBO) Referring Physician: General Surgery   HPI: The patient is a 72 y/o male who has been admitted for SBO. We have been consulted for surgical clearance, in the event that his status does not improve and surgical intervention is needed.   He recently established cardiovascular care with Dr. Tresa Endo in August after being referred by Dr. Alysia Penna. He has a history of CAD, hypertension, hyperlipidemia, COPD, prior asbestos exposure, and diabetes mellitus. In January 2006, he underwent CABG surgery x 4 in [facility] by Dr. Eulis Manly. He is a former smoker, having quit 14 years ago. He apparently also underwent a stress test in [place], a year ago, after he had experienced some recurrent chest pain episodes. Based on Dr. Landry Dyke last office note, a catheterization showed only "mild blockages". Dr. Tresa Endo ordered a 2D echo in August of this year. He had normal systolic function with an EF of 55-60% w/o WMA. He also has a history of obstructive sleep apnea on CPAP therapy with 3 L of oxygen.  He states that he has been doing fairly well from a cardiac standpoint. He is typically able to exert himself without any chest pain or limitations. Prior to undergoing CABG in 2006, his main anginal symptoms were right sided jaw and arm pain. He denies any such symptoms since that time. A few days ago, he noticed a sharp, substernal pain while shopping. However this went away after taking one SL NTG and the pain did not recur. He denies any SOB, beyond his baseline. No LEE. He reports daily compliance with his medications.      Past Medical History  Diagnosis Date  . Hypertension   . Emphysema   . Diabetes   . Hypercholesteremia   . PONV (postoperative nausea and vomiting)   . Angina at rest   . Coronary artery disease     recently started seeing Dr. Tresa Endo   . Arthritis   . GERD (gastroesophageal reflux disease)   . Elevated PSA   . Sleep  apnea     c pap with oxygen at 2.5 L  . Prostate cancer 05/23/13    gleason 3+4=7, volume 31 cc  . Asthma   . Depression   . Anxiety     Past Surgical History  Procedure Laterality Date  . Heart bypass  2006    Quad  . Degloving rt leg injury  2000    had skin grafts  . Hernia repair  2008    x 3 w/mesh, bilat inguinla, umbilical  . Back surgery  2006    discectomy lumbar  . Tonsillectomy      w/adenoidectomy  . Prostate biopsy N/A 05/23/2013    Procedure: BIOPSY TRANSRECTAL ULTRASONIC PROSTATE (TUBP);  Surgeon: Milford Cage, MD;  Location: [facility];  Service: Urology;  Laterality: N/A;  prostate nerve block  . Back surgery    . Foot surgery    . Back surgery      Family History  Problem Relation Age of Onset  . Heart disease Mother   . Heart attack Mother   . Cancer Sister   . Depression Sister     Social History:  reports that he quit smoking about 14 years ago. His smoking use included Cigarettes. He has a 40 pack-year smoking history. He has never used smokeless tobacco. He reports that he drinks alcohol. He reports that he does not use illicit drugs.  Allergies: No Known Allergies  Medications:  Prior to Admission medications   Medication Sig Start Date End Date Taking? Authorizing Provider  albuterol (PROVENTIL HFA;VENTOLIN HFA) 108 (90 BASE) MCG/ACT inhaler Inhale 2 puffs into the lungs every 6 (six) hours as needed for wheezing. 05/28/13  Yes Waymon Budge, MD  ALPRAZolam Prudy Feeler) 0.25 MG tablet Take 0.25 mg by mouth daily. 07/24/13  Yes Historical Provider, MD  B Complex-C (B-COMPLEX WITH VITAMIN C) tablet Take 1 tablet by mouth daily.   Yes Historical Provider, MD  Biotin 5000 MCG TABS Take 2 capsules by mouth daily.   Yes Historical Provider, MD  clopidogrel (PLAVIX) 75 MG tablet Take 1 tablet by mouth daily. 05/23/13  Yes Historical Provider, MD  DULoxetine (CYMBALTA) 60 MG capsule Take 60 mg by mouth daily.   Yes Historical Provider, MD  ezetimibe  (ZETIA) 10 MG tablet Take 10 mg by mouth every morning.   Yes Historical Provider, MD  Flaxseed, Linseed, (FLAXSEED OIL) 1200 MG CAPS Take 1 capsule by mouth daily.   Yes Historical Provider, MD  Melatonin 10 MG CAPS Take 10 mg by mouth daily.   Yes Historical Provider, MD  metoprolol (LOPRESSOR) 50 MG tablet Take 50 mg by mouth 2 (two) times daily.   Yes Historical Provider, MD  niacin (NIASPAN) 1000 MG CR tablet Take 1 tablet (1,000 mg total) by mouth at bedtime. 05/25/13  Yes Lennette Bihari, MD  nitroGLYCERIN (NITROSTAT) 0.3 MG SL tablet Place 0.3 mg under the tongue every 5 (five) minutes as needed for chest pain.   Yes Historical Provider, MD  omega-3 acid ethyl esters (LOVAZA) 1 G capsule  05/16/13  Yes Historical Provider, MD  ondansetron (ZOFRAN-ODT) 4 MG disintegrating tablet Take 4 mg by mouth every 6 (six) hours as needed. 08/14/13  Yes Historical Provider, MD  ranitidine (ZANTAC) 150 MG tablet Take 150 mg by mouth 2 (two) times daily.   Yes Historical Provider, MD  ranolazine (RANEXA) 500 MG 12 hr tablet Take 1 tablet (500 mg total) by mouth 2 (two) times daily. 03/16/13  Yes Lennette Bihari, MD  rosuvastatin (CRESTOR) 20 MG tablet Take 1 tablet (20 mg total) by mouth daily. 05/25/13  Yes Lennette Bihari, MD  vardenafil (LEVITRA) 20 MG tablet Take 20 mg by mouth daily as needed for erectile dysfunction.   Yes Historical Provider, MD  vitamin E 1000 UNIT capsule Take 1,000 Units by mouth daily.   Yes Historical Provider, MD     Results for orders placed during the hospital encounter of 08/17/13 (from the past 48 hour(s))  CBC WITH DIFFERENTIAL     Status: Abnormal   Collection Time    08/17/13  9:40 AM      Result Value Range   WBC 8.6  4.0 - 10.5 K/uL   RBC 4.65  4.22 - 5.81 MIL/uL   Hemoglobin 14.7  13.0 - 17.0 g/dL   Comment: DELTA CHECK NOTED     REPEATED TO VERIFY   HCT 40.4  39.0 - 52.0 %   MCV 86.9  78.0 - 100.0 fL   MCH 31.6  26.0 - 34.0 pg   MCHC 36.4 (*) 30.0 - 36.0 g/dL     RDW 16.1  09.6 - 04.5 %   Platelets 166  150 - 400 K/uL   Neutrophils Relative % 76  43 - 77 %   Neutro Abs 6.5  1.7 - 7.7 K/uL   Lymphocytes Relative 13  12 - 46 %   Lymphs Abs 1.1  0.7 - 4.0 K/uL   Monocytes Relative 10  3 - 12 %   Monocytes Absolute 0.9  0.1 - 1.0 K/uL   Eosinophils Relative 1  0 - 5 %   Eosinophils Absolute 0.1  0.0 - 0.7 K/uL   Basophils Relative 0  0 - 1 %   Basophils Absolute 0.0  0.0 - 0.1 K/uL  COMPREHENSIVE METABOLIC PANEL     Status: Abnormal   Collection Time    08/17/13  9:40 AM      Result Value Range   Sodium 138  135 - 145 mEq/L   Potassium 3.4 (*) 3.5 - 5.1 mEq/L   Chloride 97  96 - 112 mEq/L   CO2 33 (*) 19 - 32 mEq/L   Glucose, Bld 154 (*) 70 - 99 mg/dL   BUN 11  6 - 23 mg/dL   Creatinine, Ser 8.41  0.50 - 1.35 mg/dL   Calcium 8.9  8.4 - 32.4 mg/dL   Total Protein 6.8  6.0 - 8.3 g/dL   Albumin 3.5  3.5 - 5.2 g/dL   AST 18  0 - 37 U/L   ALT 18  0 - 53 U/L   Alkaline Phosphatase 37 (*) 39 - 117 U/L   Total Bilirubin 0.5  0.3 - 1.2 mg/dL   GFR calc non Af Amer 85 (*) >90 mL/min   GFR calc Af Amer >90  >90 mL/min   Comment: (NOTE)     The eGFR has been calculated using the CKD EPI equation.     This calculation has not been validated in all clinical situations.     eGFR's persistently <90 mL/min signify possible Chronic Kidney     Disease.  LIPASE, BLOOD     Status: None   Collection Time    08/17/13  9:40 AM      Result Value Range   Lipase 23  11 - 59 U/L  MAGNESIUM     Status: None   Collection Time    08/17/13  9:40 AM      Result Value Range   Magnesium 2.0  1.5 - 2.5 mg/dL  PHOSPHORUS     Status: None   Collection Time    08/17/13  9:40 AM      Result Value Range   Phosphorus 2.9  2.3 - 4.6 mg/dL  URINALYSIS, ROUTINE W REFLEX MICROSCOPIC     Status: None   Collection Time    08/17/13 11:55 AM      Result Value Range   Color, Urine YELLOW  YELLOW   APPearance CLEAR  CLEAR   Specific Gravity, Urine 1.029  1.005 - 1.030    pH 7.0  5.0 - 8.0   Glucose, UA NEGATIVE  NEGATIVE mg/dL   Hgb urine dipstick NEGATIVE  NEGATIVE   Bilirubin Urine NEGATIVE  NEGATIVE   Ketones, ur NEGATIVE  NEGATIVE mg/dL   Protein, ur NEGATIVE  NEGATIVE mg/dL   Urobilinogen, UA 1.0  0.0 - 1.0 mg/dL   Nitrite NEGATIVE  NEGATIVE   Leukocytes, UA NEGATIVE  NEGATIVE   Comment: MICROSCOPIC NOT DONE ON URINES WITH NEGATIVE PROTEIN, BLOOD, LEUKOCYTES, NITRITE, OR GLUCOSE <1000 mg/dL.  COMPREHENSIVE METABOLIC PANEL     Status: Abnormal   Collection Time    08/18/13  6:35 AM      Result Value Range   Sodium 137  135 - 145 mEq/L   Potassium 3.8  3.5 - 5.1 mEq/L   Chloride 101  96 - 112  mEq/L   CO2 32  19 - 32 mEq/L   Glucose, Bld 137 (*) 70 - 99 mg/dL   BUN 7  6 - 23 mg/dL   Creatinine, Ser 1.61  0.50 - 1.35 mg/dL   Calcium 8.4  8.4 - 09.6 mg/dL   Total Protein 5.6 (*) 6.0 - 8.3 g/dL   Albumin 2.7 (*) 3.5 - 5.2 g/dL   AST 23  0 - 37 U/L   ALT 19  0 - 53 U/L   Alkaline Phosphatase 31 (*) 39 - 117 U/L   Total Bilirubin 0.4  0.3 - 1.2 mg/dL   GFR calc non Af Amer 86 (*) >90 mL/min   GFR calc Af Amer >90  >90 mL/min   Comment: (NOTE)     The eGFR has been calculated using the CKD EPI equation.     This calculation has not been validated in all clinical situations.     eGFR's persistently <90 mL/min signify possible Chronic Kidney     Disease.  CBC WITH DIFFERENTIAL     Status: Abnormal   Collection Time    08/18/13  6:35 AM      Result Value Range   WBC 8.0  4.0 - 10.5 K/uL   RBC 4.15 (*) 4.22 - 5.81 MIL/uL   Hemoglobin 12.8 (*) 13.0 - 17.0 g/dL   HCT 04.5 (*) 40.9 - 81.1 %   MCV 86.3  78.0 - 100.0 fL   MCH 30.8  26.0 - 34.0 pg   MCHC 35.8  30.0 - 36.0 g/dL   RDW 91.4  78.2 - 95.6 %   Platelets 131 (*) 150 - 400 K/uL   Neutrophils Relative % 72  43 - 77 %   Neutro Abs 5.7  1.7 - 7.7 K/uL   Lymphocytes Relative 16  12 - 46 %   Lymphs Abs 1.2  0.7 - 4.0 K/uL   Monocytes Relative 9  3 - 12 %   Monocytes Absolute 0.8  0.1 -  1.0 K/uL   Eosinophils Relative 3  0 - 5 %   Eosinophils Absolute 0.3  0.0 - 0.7 K/uL   Basophils Relative 0  0 - 1 %   Basophils Absolute 0.0  0.0 - 0.1 K/uL    Ct Abdomen Pelvis W Contrast  08/17/2013   CLINICAL DATA:  Abdominal pain, vomiting, question small bowel obstruction.  EXAM: CT ABDOMEN AND PELVIS WITH CONTRAST  TECHNIQUE: Multidetector CT imaging of the abdomen and pelvis was performed using the standard protocol following bolus administration of intravenous contrast.  CONTRAST:  OMNIPAQUE IOHEXOL 300 MG/ML  SOLN  COMPARISON:  08/15/2013  FINDINGS: Linear densities in the lung bases, likely scarring or atelectasis. Heart is normal size. No pleural effusions.  Liver, gallbladder, spleen, pancreas, adrenals and kidneys are unremarkable.  There are dilated small bowel loops in the abdomen and pelvis with air-fluid levels compatible with small bowel obstruction. Small amount of perihepatic and mesenteric free fluid. The transition point appears to be in the right lower quadrant on image 69 without visible obstructing lesion, question adhesions.  Scattered colonic diverticula. No active diverticulitis. No free air or adenopathy.  Small left lateral bladder wall diverticulum. Aorta and iliac vessels are calcified. Slight aneurysmal dilatation of the infrarenal abdominal aorta measuring maximally 3.0 cm.  No acute bony abnormality. Degenerative changes in the thoracolumbar spine.  IMPRESSION: Small bowel obstruction which appears partial, mid to high-grade. The transition point is noted in the right lower quadrant without  visible obstructing process, question adhesions. Small amount of free fluid in the mesenteric and adjacent to the liver.   Electronically Signed   By: Charlett Nose M.D.   On: 08/17/2013 11:55    Review of Systems  Respiratory: Negative for shortness of breath.   Cardiovascular: Negative for chest pain and leg swelling.  Gastrointestinal: Positive for nausea, vomiting,  abdominal pain and constipation.  All other systems reviewed and are negative.   Blood pressure 129/78, pulse 85, temperature 98 F (36.7 C), temperature source Oral, resp. rate 18, height 5\' 11"  (1.803 m), weight 90.901 kg (200 lb 6.4 oz), SpO2 97.00%. Physical Exam  Constitutional: He is oriented to person, place, and time. He appears well-developed and well-nourished. No distress.  Neck: No JVD present. Carotid bruit is not present.  Cardiovascular: Normal rate, regular rhythm, normal heart sounds and intact distal pulses.  Exam reveals no gallop and no friction rub.   No murmur heard. Pulses:      Radial pulses are 2+ on the right side, and 2+ on the left side.       Dorsalis pedis pulses are 2+ on the right side, and 2+ on the left side.  Respiratory: Effort normal. No respiratory distress.  Decreased breath sounds bilaterally, consistent with COPD  GI: Bowel sounds are normal. He exhibits distension. There is tenderness.  Neurological: He is alert and oriented to person, place, and time.  Skin: Skin is warm and dry. He is not diaphoretic.  Psychiatric: He has a normal mood and affect. His behavior is normal.    Assessment/Plan: Active Problems:   CAD (coronary artery disease) s/p CABG x 4 in 2006   Hyperlipidemia LDL goal < 70   Partial small bowel obstruction  Plan: 72 y/o male, s/p CABG x 4 in 2006, admitted for SBO. Apparently, he had a cath a year ago that showed "mild blockages". Unfortunately this was done in [place] and we are w/o records at this time. He was place on Ranexa and has had no anginal symptoms since that time. 2D echo in 03/2013 showed normal systolic function with an EF of 55-60% w/o WMA. EKG w/o ischemic changes. Based on this information he is ok for surgery if needed. MD to follow.   Daniel Singleton 08/18/2013, 11:41 AM     Agree with note written by Boyce Medici  [facility]  [facility] of Dr. Landry Dyke for pre op clearance secondary to SBO. Pt with H/O CAD  s/p remote CABG 2006 in [place]. Cath last year showed minimal CAD. Nl 2D echo recently. No significant CP. Has NGT and active BS. Exam otherwise benign. EKG w/o acute changes. Pt is at low cardiovascular risk if surgery required. No stress test necessary. Will be available for further questions if necessary.   Runell Gess 08/18/2013 11:53 AM

## 2013-08-19 ENCOUNTER — Inpatient Hospital Stay (HOSPITAL_COMMUNITY): Payer: Medicare Other

## 2013-08-19 LAB — COMPREHENSIVE METABOLIC PANEL
Albumin: 2.9 g/dL — ABNORMAL LOW (ref 3.5–5.2)
Alkaline Phosphatase: 31 U/L — ABNORMAL LOW (ref 39–117)
BUN: 6 mg/dL (ref 6–23)
CO2: 31 mEq/L (ref 19–32)
Calcium: 8.7 mg/dL (ref 8.4–10.5)
Chloride: 99 mEq/L (ref 96–112)
Creatinine, Ser: 0.88 mg/dL (ref 0.50–1.35)
GFR calc Af Amer: >90 mL/min (ref >90)
GFR calc non Af Amer: 85 mL/min — ABNORMAL LOW (ref >90)
Glucose, Bld: 146 mg/dL — ABNORMAL HIGH (ref 70–99)
Total Bilirubin: 0.5 mg/dL (ref 0.3–1.2)

## 2013-08-19 LAB — CBC WITH DIFFERENTIAL/PLATELET
Basophils Absolute: 0.1 10*3/uL (ref 0.0–0.1)
Basophils Relative: 1 % (ref 0–1)
Eosinophils Absolute: 0.3 10*3/uL (ref 0.0–0.7)
Eosinophils Relative: 3 % (ref 0–5)
Hemoglobin: 13.4 g/dL (ref 13.0–17.0)
Lymphocytes Relative: 15 % (ref 12–46)
Lymphs Abs: 1.3 10*3/uL (ref 0.7–4.0)
Neutrophils Relative %: 69 % (ref 43–77)
Platelets: ADEQUATE 10*3/uL (ref 150–400)
RBC: 4.24 MIL/uL (ref 4.22–5.81)

## 2013-08-19 NOTE — Progress Notes (Signed)
Subjective: He feels slightly better.  abd is a bit less distended.  Surgery note reviewed. Passed some flatus.  Objective: Vital signs in last 24 hours: Temp:  [98.3 F (36.8 C)] 98.3 F (36.8 C) (12/28 0555) Pulse Rate:  [85-100] 93 (12/28 0555) Resp:  [18] 18 (12/28 0555) BP: (122-135)/(60-74) 122/74 mmHg (12/28 0555) SpO2:  [92 %-93 %] 92 % (12/28 0555) Weight:  [90.2 kg (198 lb 13.7 oz)] 90.2 kg (198 lb 13.7 oz) (12/28 0555) Weight change: -0.3 kg (-10.6 oz) Last BM Date: 08/15/13  Intake/Output from previous day: 12/27 0701 - 12/28 0700 In: 3163.3 [I.V.:3163.3] Out: 3525 [Urine:1925; Emesis/NG output:1600] Intake/Output this shift:    General appearance: alert, cooperative and appears stated age Resp: clear to auscultation bilaterally Cardio: regular rate and rhythm, S1, S2 normal, no murmur, click, rub or gallop GI: mild distension, more soft though. Not really tender today Extremities: extremities normal, atraumatic, no cyanosis or edema Neurologic: Grossly normal   Lab Results:  Recent Labs  08/18/13 0635 08/19/13 0410  WBC 8.0 8.4  HGB 12.8* 13.4  HCT 35.8* 36.7*  PLT 131* PLATELET CLUMPS NOTED ON SMEAR, COUNT APPEARS ADEQUATE   BMET  Recent Labs  08/18/13 0635 08/19/13 0410  NA 137 136  K 3.8 3.9  CL 101 99  CO2 32 31  GLUCOSE 137* 146*  BUN 7 6  CREATININE 0.86 0.88  CALCIUM 8.4 8.7   CMET CMP     Component Value Date/Time   NA 136 08/19/2013 0410   K 3.9 08/19/2013 0410   CL 99 08/19/2013 0410   CO2 31 08/19/2013 0410   GLUCOSE 146* 08/19/2013 0410   BUN 6 08/19/2013 0410   CREATININE 0.88 08/19/2013 0410   CREATININE 1.00 05/04/2013 1624   CALCIUM 8.7 08/19/2013 0410   PROT 6.1 08/19/2013 0410   ALBUMIN 2.9* 08/19/2013 0410   AST 29 08/19/2013 0410   ALT 21 08/19/2013 0410   ALKPHOS 31* 08/19/2013 0410   BILITOT 0.5 08/19/2013 0410   GFRNONAA 85* 08/19/2013 0410   GFRAA >90 08/19/2013 0410     Studies/Results: No results  found.  Medications: I have reviewed the patient's current medications.  . enoxaparin (LOVENOX) injection  40 mg Subcutaneous Q24H  . metoprolol  50 mg Oral BID  . ranolazine  500 mg Oral BID     Assessment/Plan:  Active Problems:   CAD (coronary artery disease) s/p CABG x 4 in 2006-stable.  plavix on hold. If determination is made to not have surgery we would want to promptly resume plavix.   Hyperlipidemia LDL goal < 70-follow.   Partial small bowel obstruction-management per surgery. xrays pending. NG drainage ongoing but he was advised to try some ice chips.   LOS: 2 days   Ezequiel Kayser, MD 08/19/2013, 1:26 PM

## 2013-08-19 NOTE — Progress Notes (Signed)
Subjective: Still w/ NGT for SBO. Abdominal pain improved. No further nausea. Denies CP or SOB.   Objective: Vital signs in last 24 hours: Temp:  [98.2 F (36.8 C)-98.3 F (36.8 C)] 98.3 F (36.8 C) (12/28 0555) Pulse Rate:  [84-100] 93 (12/28 0555) Resp:  [18] 18 (12/28 0555) BP: (122-135)/(60-74) 122/74 mmHg (12/28 0555) SpO2:  [92 %-94 %] 92 % (12/28 0555) Weight:  [90.2 kg (198 lb 13.7 oz)] 90.2 kg (198 lb 13.7 oz) (12/28 0555) Last BM Date: 08/15/13  Intake/Output from previous day: 12/27 0701 - 12/28 0700 In: 3163.3 [I.V.:3163.3] Out: 3525 [Urine:1925; Emesis/NG output:1600] Intake/Output this shift:    Medications Current Facility-Administered Medications  Medication Dose Route Frequency Provider Last Rate Last Dose  . acetaminophen (TYLENOL) tablet 650 mg  650 mg Oral Q6H PRN Ezequiel Kayser, MD       Or  . acetaminophen (TYLENOL) suppository 650 mg  650 mg Rectal Q6H PRN Ezequiel Kayser, MD      . dextrose 5 % and 0.45 % NaCl with KCl 20 mEq/L infusion   Intravenous Continuous Ezequiel Kayser, MD 100 mL/hr at 08/18/13 2337    . enoxaparin (LOVENOX) injection 40 mg  40 mg Subcutaneous Q24H Ezequiel Kayser, MD   40 mg at 08/18/13 2245  . metoprolol tartrate (LOPRESSOR) tablet 50 mg  50 mg Oral BID Ezequiel Kayser, MD   50 mg at 08/18/13 2245  . morphine 2 MG/ML injection 1-4 mg  1-4 mg Intravenous Q2H PRN Megan Dort, PA-C   2 mg at 08/19/13 0559  . nitroGLYCERIN (NITROSTAT) SL tablet 0.4 mg  0.4 mg Sublingual Q5 min PRN Alysia Penna, MD      . ondansetron (ZOFRAN) injection 4 mg  4 mg Intravenous Q6H PRN Megan Dort, PA-C      . ondansetron (ZOFRAN) tablet 4 mg  4 mg Oral Q6H PRN Ezequiel Kayser, MD       Or  . ondansetron (ZOFRAN) injection 4 mg  4 mg Intravenous Q6H PRN Ezequiel Kayser, MD   4 mg at 08/18/13 1910  . ranolazine (RANEXA) 12 hr tablet 500 mg  500 mg Oral BID Ezequiel Kayser, MD   500 mg at 08/18/13 2246    PE: General appearance: alert, cooperative and no  distress Lungs: decreased BS bilaterally Heart: regular rate and rhythm Extremities: no LEE Pulses: 2+ and symmetric Skin: warm and dry Neurologic: Grossly normal  Lab Results:   Recent Labs  08/17/13 0940 08/18/13 0635 08/19/13 0410  WBC 8.6 8.0 8.4  HGB 14.7 12.8* 13.4  HCT 40.4 35.8* 36.7*  PLT 166 131* PLATELET CLUMPS NOTED ON SMEAR, COUNT APPEARS ADEQUATE   BMET  Recent Labs  08/17/13 0940 08/18/13 0635 08/19/13 0410  NA 138 137 136  K 3.4* 3.8 3.9  CL 97 101 99  CO2 33* 32 31  GLUCOSE 154* 137* 146*  BUN 11 7 6   CREATININE 0.87 0.86 0.88  CALCIUM 8.9 8.4 8.7      Assessment/Plan  Active Problems:   CAD (coronary artery disease) s/p CABG x 4 in 2006   Hyperlipidemia LDL goal < 70   Partial small bowel obstruction  Plan: Still w/ NGT for SBO. High output. -1.6 L. + BS. Abdomin less distended. Plavix is on hold in case surgery is needed. W/o CP or SOB. Ok for surgery if needed.      LOS: 2 days    Brittainy M. Sharol Harness, PA-C  08/19/2013 9:03 AM   Patient seen and examined. Agree with assessment and plan. Fels somewhat better. No chest pain or dyspnea. Now off Plavix. Hopefully conservative management, but OK from cardiac standpoint if surgery is necessary.  Probably can stay off plavix indefinitely and resume ASA alone when stable.    Lennette Bihari, MD, [facility] 08/19/2013 9:23 AM

## 2013-08-19 NOTE — Progress Notes (Signed)
Spoke with son about patient.  Son requested if GI could consult on his father.  Will pass on in report.

## 2013-08-19 NOTE — Progress Notes (Signed)
  Subjective: No complaints. He says he feels better. He says he has passed flatus  Objective: Vital signs in last 24 hours: Temp:  [98.2 F (36.8 C)-98.3 F (36.8 C)] 98.3 F (36.8 C) (12/28 0555) Pulse Rate:  [84-100] 93 (12/28 0555) Resp:  [18] 18 (12/28 0555) BP: (122-135)/(60-74) 122/74 mmHg (12/28 0555) SpO2:  [92 %-94 %] 92 % (12/28 0555) Weight:  [198 lb 13.7 oz (90.2 kg)] 198 lb 13.7 oz (90.2 kg) (12/28 0555) Last BM Date: 08/15/13  Intake/Output from previous day: 12/27 0701 - 12/28 0700 In: 3163.3 [I.V.:3163.3] Out: 3525 [Urine:1925; Emesis/NG output:1600] Intake/Output this shift:    Resp: clear to auscultation bilaterally Cardio: regular rate and rhythm GI: soft, nontender. minimal distension. good bs  Lab Results:   Recent Labs  08/18/13 0635 08/19/13 0410  WBC 8.0 8.4  HGB 12.8* 13.4  HCT 35.8* 36.7*  PLT 131* PLATELET CLUMPS NOTED ON SMEAR, COUNT APPEARS ADEQUATE   BMET  Recent Labs  08/18/13 0635 08/19/13 0410  NA 137 136  K 3.8 3.9  CL 101 99  CO2 32 31  GLUCOSE 137* 146*  BUN 7 6  CREATININE 0.86 0.88  CALCIUM 8.4 8.7   PT/INR No results found for this basename: LABPROT, INR,  in the last 72 hours ABG No results found for this basename: PHART, PCO2, PO2, HCO3,  in the last 72 hours  Studies/Results: Ct Abdomen Pelvis W Contrast  08/17/2013   CLINICAL DATA:  Abdominal pain, vomiting, question small bowel obstruction.  EXAM: CT ABDOMEN AND PELVIS WITH CONTRAST  TECHNIQUE: Multidetector CT imaging of the abdomen and pelvis was performed using the standard protocol following bolus administration of intravenous contrast.  CONTRAST:  OMNIPAQUE IOHEXOL 300 MG/ML  SOLN  COMPARISON:  08/15/2013  FINDINGS: Linear densities in the lung bases, likely scarring or atelectasis. Heart is normal size. No pleural effusions.  Liver, gallbladder, spleen, pancreas, adrenals and kidneys are unremarkable.  There are dilated small bowel loops in the  abdomen and pelvis with air-fluid levels compatible with small bowel obstruction. Small amount of perihepatic and mesenteric free fluid. The transition point appears to be in the right lower quadrant on image 69 without visible obstructing lesion, question adhesions.  Scattered colonic diverticula. No active diverticulitis. No free air or adenopathy.  Small left lateral bladder wall diverticulum. Aorta and iliac vessels are calcified. Slight aneurysmal dilatation of the infrarenal abdominal aorta measuring maximally 3.0 cm.  No acute bony abnormality. Degenerative changes in the thoracolumbar spine.  IMPRESSION: Small bowel obstruction which appears partial, mid to high-grade. The transition point is noted in the right lower quadrant without visible obstructing process, question adhesions. Small amount of free fluid in the mesenteric and adjacent to the liver.   Electronically Signed   By: Charlett Nose M.D.   On: 08/17/2013 11:55    Anti-infectives: Anti-infectives   None      Assessment/Plan: s/p * No surgery found * will check abd xrays today Clinically improving Continue ng for now  LOS: 2 days    TOTH III,PAUL S 08/19/2013

## 2013-08-20 DIAGNOSIS — E785 Hyperlipidemia, unspecified: Secondary | ICD-10-CM

## 2013-08-20 LAB — CBC WITH DIFFERENTIAL/PLATELET
Basophils Absolute: 0 10*3/uL (ref 0.0–0.1)
Basophils Relative: 0 % (ref 0–1)
Eosinophils Absolute: 0.1 10*3/uL (ref 0.0–0.7)
Eosinophils Relative: 1 % (ref 0–5)
HCT: 37.8 % — ABNORMAL LOW (ref 39.0–52.0)
Hemoglobin: 13.4 g/dL (ref 13.0–17.0)
MCH: 30.9 pg (ref 26.0–34.0)
MCHC: 35.4 g/dL (ref 30.0–36.0)
MCV: 87.3 fL (ref 78.0–100.0)
Monocytes Absolute: 0.8 10*3/uL (ref 0.1–1.0)
Monocytes Relative: 11 % (ref 3–12)
Neutro Abs: 5.2 10*3/uL (ref 1.7–7.7)
RDW: 13.4 % (ref 11.5–15.5)
WBC: 7.6 10*3/uL (ref 4.0–10.5)

## 2013-08-20 LAB — COMPREHENSIVE METABOLIC PANEL
ALT: 20 U/L (ref 0–53)
AST: 22 U/L (ref 0–37)
Albumin: 3 g/dL — ABNORMAL LOW (ref 3.5–5.2)
Alkaline Phosphatase: 33 U/L — ABNORMAL LOW (ref 39–117)
BUN: 9 mg/dL (ref 6–23)
GFR calc Af Amer: >90 mL/min (ref >90)
Glucose, Bld: 142 mg/dL — ABNORMAL HIGH (ref 70–99)
Potassium: 3.9 mEq/L (ref 3.5–5.1)
Sodium: 136 mEq/L (ref 135–145)
Total Protein: 6.3 g/dL (ref 6.0–8.3)

## 2013-08-20 NOTE — Progress Notes (Signed)
  Subjective: PT states he con't to pass gas.  No BM.  Feels less bloated today.  Objective: Vital signs in last 24 hours: Temp:  [98.1 F (36.7 C)-98.5 F (36.9 C)] 98.2 F (36.8 C) (12/29 1610) Pulse Rate:  [67-84] 84 (12/29 0614) Resp:  [18-20] 18 (12/29 0614) BP: (122-153)/(65-81) 122/77 mmHg (12/29 0614) SpO2:  [92 %-93 %] 92 % (12/29 0614) Weight:  [194 lb 1.6 oz (88.043 kg)] 194 lb 1.6 oz (88.043 kg) (12/29 9604) Last BM Date: 08/15/13  Intake/Output from previous day: 09/06/23 0701 - 12/29 0700 In: 1200 [I.V.:1200] Out: 2500 [Urine:900; Emesis/NG output:1600] Intake/Output this shift:    General appearance: alert and cooperative GI: soft, NT, min dist, active BS  Lab Results:   Recent Labs  2013/09/05 0410 08/20/13 0525  WBC 8.4 7.6  HGB 13.4 13.4  HCT 36.7* 37.8*  PLT PLATELET CLUMPS NOTED ON SMEAR, COUNT APPEARS ADEQUATE 177   BMET  Recent Labs  09-05-13 0410 08/20/13 0525  NA 136 136  K 3.9 3.9  CL 99 97  CO2 31 31  GLUCOSE 146* 142*  BUN 6 9  CREATININE 0.88 0.91  CALCIUM 8.7 8.6   PT/INR No results found for this basename: LABPROT, INR,  in the last 72 hours ABG No results found for this basename: PHART, PCO2, PO2, HCO3,  in the last 72 hours  Studies/Results: Dg Abd 2 Views  09/05/13   CLINICAL DATA:  Abdominal distention  EXAM: ABDOMEN - 2 VIEW  COMPARISON:  CT abdomen and pelvis August 17, 2013  FINDINGS: Nasogastric tube tip and side port are in the proximal stomach. There is contrast in the colon. Overall, the bowel gas pattern is unremarkable. No obstruction or free air is seen currently. There are multiple coils in the mid abdomen. There are phleboliths in the pelvis.  IMPRESSION: Nasogastric tube tip and side port are in the proximal stomach. The bowel gas pattern is overall unremarkable. Note that contrast is now seen throughout the large bowel.   Electronically Signed   By: Bretta Bang M.D.   On: 09-05-2013 21:52     Anti-infectives: Anti-infectives   None      Assessment/Plan: pSBO 1. Clamp NT 2. May be able to DC if tolerates 3. Encourage ambulation  LOS: 3 days    Marigene Ehlers., [facility] 08/20/2013

## 2013-08-20 NOTE — Progress Notes (Addendum)
Subjective: Wondering when he can go home and how long this will take  Still has some nausea from the NGT Still has NGT outpt (500cc this AM)  Passed gas overnight but no BM   Objective: Vital signs in last 24 hours: Temp:  [98.1 F (36.7 C)-98.5 F (36.9 C)] 98.2 F (36.8 C) (12/29 0614) Pulse Rate:  [67-84] 84 (12/29 0614) Resp:  [18-20] 18 (12/29 0614) BP: (122-153)/(65-81) 122/77 mmHg (12/29 0614) SpO2:  [92 %-93 %] 92 % (12/29 1610) Weight change:  Last BM Date: 08/15/13  Intake/Output from previous day: 2023-08-28 0701 - 12/29 0700 In: 0  Out: 1700 [Urine:900; Emesis/NG output:800] Intake/Output this shift: Total I/O In: -  Out: 600 [Urine:600]  Gen: WM w/ NGT in place and NAD  HEENT: MMM, trachea midline, NGT in nare w/ tape in place Card: RRR, no MRG  PUlm: CTAB, no crackles, wheezes  Abd: not distended. Relatively soft. Not tender. Hypoactive BS Ext:  No edema or cyanosis    Lab Results:  Recent Labs  08-27-2013 0410 08/20/13 0525  WBC 8.4 7.6  HGB 13.4 13.4  HCT 36.7* 37.8*  PLT PLATELET CLUMPS NOTED ON SMEAR, COUNT APPEARS ADEQUATE 177   BMET  Recent Labs  08/27/2013 0410 08/20/13 0525  NA 136 136  K 3.9 3.9  CL 99 97  CO2 31 31  GLUCOSE 146* 142*  BUN 6 9  CREATININE 0.88 0.91  CALCIUM 8.7 8.6   CMET CMP     Component Value Date/Time   NA 136 08/20/2013 0525   K 3.9 08/20/2013 0525   CL 97 08/20/2013 0525   CO2 31 08/20/2013 0525   GLUCOSE 142* 08/20/2013 0525   BUN 9 08/20/2013 0525   CREATININE 0.91 08/20/2013 0525   CREATININE 1.00 05/04/2013 1624   CALCIUM 8.6 08/20/2013 0525   PROT 6.3 08/20/2013 0525   ALBUMIN 3.0* 08/20/2013 0525   AST 22 08/20/2013 0525   ALT 20 08/20/2013 0525   ALKPHOS 33* 08/20/2013 0525   BILITOT 0.5 08/20/2013 0525   GFRNONAA 84* 08/20/2013 0525   GFRAA >90 08/20/2013 0525     Studies/Results: Dg Abd 2 Views  Aug 27, 2013   CLINICAL DATA:  Abdominal distention  EXAM: ABDOMEN - 2 VIEW  COMPARISON:   CT abdomen and pelvis August 17, 2013  FINDINGS: Nasogastric tube tip and side port are in the proximal stomach. There is contrast in the colon. Overall, the bowel gas pattern is unremarkable. No obstruction or free air is seen currently. There are multiple coils in the mid abdomen. There are phleboliths in the pelvis.  IMPRESSION: Nasogastric tube tip and side port are in the proximal stomach. The bowel gas pattern is overall unremarkable. Note that contrast is now seen throughout the large bowel.   Electronically Signed   By: Bretta Bang M.D.   On: 08-27-2013 21:52    Medications: I have reviewed the patient's current medications.  . enoxaparin (LOVENOX) injection  40 mg Subcutaneous Q24H  . metoprolol  50 mg Oral BID  . ranolazine  500 mg Oral BID     Assessment/Plan:  Active Problems:   #CAD (coronary artery disease) s/p CABG x 4 in 2006-stable.  plavix on hold. If determination is made to not have surgery we will resume ASA 325 alone per cards recs   # Hyperlipidemia LDL goal < 70-follow. Resume home zetia 10, crestor 20, and lovaza 1 gm, niacin 1000 when able to tolerate PO   # Partial small bowel  obstruction-management per surgery. Explained to spouse today that gen surg is most appropriate consult at this time, but GI can be consulted in future if needed   # prostate cancer - currently w/ lupron q6 mo per Dr Margarita Grizzle and xrt per Dr Dayton Scrape  # OSA - resume home CPAP once NGT is removed  # HTN - metop 50 BID  # COPD - proair prn only at home  # Dispo - pending surgical recs    LOS: 3 days   Alysia Penna, MD 08/20/2013, 6:48 AM

## 2013-08-20 NOTE — Progress Notes (Signed)
Subjective: Denies CP/SOB. Passing flatus. Sill hasn't produced a BM.   Objective: Vital signs in last 24 hours: Temp:  [98.1 F (36.7 C)-98.5 F (36.9 C)] 98.2 F (36.8 C) (12/29 0454) Pulse Rate:  [67-84] 84 (12/29 0614) Resp:  [18-20] 18 (12/29 0614) BP: (122-153)/(65-81) 122/77 mmHg (12/29 0614) SpO2:  [92 %-93 %] 92 % (12/29 0614) Weight:  [88.043 kg (194 lb 1.6 oz)] 88.043 kg (194 lb 1.6 oz) (12/29 0614) Last BM Date: 08/15/13  Intake/Output from previous day: 12/28 0701 - 12/29 0700 In: 1200 [I.V.:1200] Out: 2500 [Urine:900; Emesis/NG output:1600] Intake/Output this shift:    Medications Current Facility-Administered Medications  Medication Dose Route Frequency Provider Last Rate Last Dose  . acetaminophen (TYLENOL) tablet 650 mg  650 mg Oral Q6H PRN Ezequiel Kayser, MD       Or  . acetaminophen (TYLENOL) suppository 650 mg  650 mg Rectal Q6H PRN Ezequiel Kayser, MD      . dextrose 5 % and 0.45 % NaCl with KCl 20 mEq/L infusion   Intravenous Continuous Ezequiel Kayser, MD 100 mL/hr at 08/19/13 1932    . enoxaparin (LOVENOX) injection 40 mg  40 mg Subcutaneous Q24H Ezequiel Kayser, MD   40 mg at 08/19/13 2251  . metoprolol tartrate (LOPRESSOR) tablet 50 mg  50 mg Oral BID Ezequiel Kayser, MD   50 mg at 08/19/13 2251  . morphine 2 MG/ML injection 1-4 mg  1-4 mg Intravenous Q2H PRN Megan Dort, PA-C   2 mg at 08/20/13 0831  . nitroGLYCERIN (NITROSTAT) SL tablet 0.4 mg  0.4 mg Sublingual Q5 min PRN Alysia Penna, MD      . ondansetron (ZOFRAN) injection 4 mg  4 mg Intravenous Q6H PRN Megan Dort, PA-C   4 mg at 08/19/13 1706  . ondansetron (ZOFRAN) tablet 4 mg  4 mg Oral Q6H PRN Ezequiel Kayser, MD       Or  . ondansetron (ZOFRAN) injection 4 mg  4 mg Intravenous Q6H PRN Ezequiel Kayser, MD   4 mg at 08/19/13 2302  . ranolazine (RANEXA) 12 hr tablet 500 mg  500 mg Oral BID Ezequiel Kayser, MD   500 mg at 08/19/13 2255    PE: General appearance: alert, cooperative, no distress and w/  NGT Lungs: clear to auscultation bilaterally Heart: regular rate and rhythm, S1, S2 normal, no murmur, click, rub or gallop Abdomen: less distended, slight diffuse tenderness, + BS Extremities: no LEE Pulses: 2+ and symmetric Skin: warm and dry Neurologic: Grossly normal  Lab Results:   Recent Labs  08/18/13 0635 08/19/13 0410 08/20/13 0525  WBC 8.0 8.4 7.6  HGB 12.8* 13.4 13.4  HCT 35.8* 36.7* 37.8*  PLT 131* PLATELET CLUMPS NOTED ON SMEAR, COUNT APPEARS ADEQUATE 177   BMET  Recent Labs  08/18/13 0635 08/19/13 0410 08/20/13 0525  NA 137 136 136  K 3.8 3.9 3.9  CL 101 99 97  CO2 32 31 31  GLUCOSE 137* 146* 142*  BUN 7 6 9   CREATININE 0.86 0.88 0.91  CALCIUM 8.4 8.7 8.6      Assessment/Plan    Active Problems:   CAD (coronary artery disease) s/p CABG x 4 in 2006   Hyperlipidemia LDL goal < 70   Partial small bowel obstruction  Plan: Remains stable from a cardiovascular standpoint. No CP or SOB. HR and BP both stable. Plavix on hold, in case surgery is elected. W/o history of any coronary  stents, may be able to discontinue permanently. Still w/ NGT. Abdominal x-ray seems to show some signs of improvement. + BS and passing flatus. No BM. IM and general surgery managing.     LOS: 3 days    Brittainy M. Delmer Islam 08/20/2013 8:43 AM  Looks surprisingly well for SBO.  NGT still with dark liquid, but + Bowel sounds & flatus.  Plan is clamp NGT with hope to d/c tube today.  Advance diet soon.    BP & HR stable & tolerating PO Meds.   Per Dr. Tresa Endo, can simply d/c Plavix as no stents.  With no active cardiac issues, [facility] will sign off for now.   Pls contact [facility] team prior to d/c home for assistance arranging f/u appt.  Marykay Lex, M.D., M.S. [facility] [address] [place], [place]  [address]  [phone] Pager # [phone] 08/20/2013 10:24 AM

## 2013-08-21 LAB — COMPREHENSIVE METABOLIC PANEL
AST: 23 U/L (ref 0–37)
Alkaline Phosphatase: 35 U/L — ABNORMAL LOW (ref 39–117)
BUN: 8 mg/dL (ref 6–23)
CO2: 29 mEq/L (ref 19–32)
Calcium: 8.8 mg/dL (ref 8.4–10.5)
GFR calc Af Amer: 86 mL/min — ABNORMAL LOW (ref >90)
GFR calc non Af Amer: 74 mL/min — ABNORMAL LOW (ref >90)
Glucose, Bld: 128 mg/dL — ABNORMAL HIGH (ref 70–99)
Potassium: 4.4 mEq/L (ref 3.7–5.3)
Total Bilirubin: 0.6 mg/dL (ref 0.3–1.2)
Total Protein: 6.4 g/dL (ref 6.0–8.3)

## 2013-08-21 LAB — CBC WITH DIFFERENTIAL/PLATELET
Basophils Absolute: 0 10*3/uL (ref 0.0–0.1)
Basophils Relative: 0 % (ref 0–1)
Eosinophils Relative: 2 % (ref 0–5)
HCT: 37.9 % — ABNORMAL LOW (ref 39.0–52.0)
Lymphocytes Relative: 20 % (ref 12–46)
MCHC: 35.9 g/dL (ref 30.0–36.0)
MCV: 87.5 fL (ref 78.0–100.0)
Monocytes Absolute: 0.7 10*3/uL (ref 0.1–1.0)
Neutro Abs: 4.7 10*3/uL (ref 1.7–7.7)
Neutrophils Relative %: 68 % (ref 43–77)
Platelets: 181 10*3/uL (ref 150–400)
RDW: 13.4 % (ref 11.5–15.5)
WBC: 6.9 10*3/uL (ref 4.0–10.5)

## 2013-08-21 MED ORDER — ASPIRIN 81 MG PO TABS: 81.0000 mg | ORAL_TABLET | Freq: Every day | ORAL | Status: DC

## 2013-08-21 NOTE — Discharge Summary (Signed)
Physician Discharge Summary    TYMIR TERRAL  MR#: [id number]  DOB:Jan 03, 1941  Date of Admission: 08/17/2013 Date of Discharge: 08/21/2013  Attending Physician:Hamzah Savoca  Patient's WUJ:[id number], Lorin Picket, MD  Consults:Treatment Team:  Bishop Limbo, MD    Discharge Diagnoses: Active Problems:   CAD (coronary artery disease) s/p CABG x 4 in 2006   Hyperlipidemia LDL goal < 70   Partial small bowel obstruction   Discharge Medications:   Medication List    STOP taking these medications       clopidogrel 75 MG tablet  Commonly known as:  PLAVIX      TAKE these medications       albuterol 108 (90 BASE) MCG/ACT inhaler  Commonly known as:  PROVENTIL HFA;VENTOLIN HFA  Inhale 2 puffs into the lungs every 6 (six) hours as needed for wheezing.     ALPRAZolam 0.25 MG tablet  Commonly known as:  XANAX  Take 0.25 mg by mouth daily.     aspirin 81 MG tablet  Take 1 tablet (81 mg total) by mouth daily.     B-complex with vitamin C tablet  Take 1 tablet by mouth daily.     Biotin 5000 MCG Tabs  Take 2 capsules by mouth daily.     DULoxetine 60 MG capsule  Commonly known as:  CYMBALTA  Take 60 mg by mouth daily.     ezetimibe 10 MG tablet  Commonly known as:  ZETIA  Take 10 mg by mouth every morning.     Flaxseed Oil 1200 MG Caps  Take 1 capsule by mouth daily.     Melatonin 10 MG Caps  Take 10 mg by mouth daily.     metoprolol 50 MG tablet  Commonly known as:  LOPRESSOR  Take 50 mg by mouth 2 (two) times daily.     niacin 1000 MG CR tablet  Commonly known as:  NIASPAN  Take 1 tablet (1,000 mg total) by mouth at bedtime.     nitroGLYCERIN 0.3 MG SL tablet  Commonly known as:  NITROSTAT  Place 0.3 mg under the tongue every 5 (five) minutes as needed for chest pain.     omega-3 acid ethyl esters 1 G capsule  Commonly known as:  LOVAZA     ondansetron 4 MG disintegrating tablet  Commonly known as:  ZOFRAN-ODT  Take 4 mg by mouth every 6 (six) hours as  needed.     ranitidine 150 MG tablet  Commonly known as:  ZANTAC  Take 150 mg by mouth 2 (two) times daily.     ranolazine 500 MG 12 hr tablet  Commonly known as:  RANEXA  Take 1 tablet (500 mg total) by mouth 2 (two) times daily.     rosuvastatin 20 MG tablet  Commonly known as:  CRESTOR  Take 1 tablet (20 mg total) by mouth daily.     vardenafil 20 MG tablet  Commonly known as:  LEVITRA  Take 20 mg by mouth daily as needed for erectile dysfunction.     vitamin E 1000 UNIT capsule  Take 1,000 Units by mouth daily.        Hospital Procedures: Ct Abdomen Pelvis W Contrast  08/17/2013   CLINICAL DATA:  Abdominal pain, vomiting, question small bowel obstruction.  EXAM: CT ABDOMEN AND PELVIS WITH CONTRAST  TECHNIQUE: Multidetector CT imaging of the abdomen and pelvis was performed using the standard protocol following bolus administration of intravenous contrast.  CONTRAST:  OMNIPAQUE IOHEXOL 300 MG/ML  SOLN  COMPARISON:  08/15/2013  FINDINGS: Linear densities in the lung bases, likely scarring or atelectasis. Heart is normal size. No pleural effusions.  Liver, gallbladder, spleen, pancreas, adrenals and kidneys are unremarkable.  There are dilated small bowel loops in the abdomen and pelvis with air-fluid levels compatible with small bowel obstruction. Small amount of perihepatic and mesenteric free fluid. The transition point appears to be in the right lower quadrant on image 69 without visible obstructing lesion, question adhesions.  Scattered colonic diverticula. No active diverticulitis. No free air or adenopathy.  Small left lateral bladder wall diverticulum. Aorta and iliac vessels are calcified. Slight aneurysmal dilatation of the infrarenal abdominal aorta measuring maximally 3.0 cm.  No acute bony abnormality. Degenerative changes in the thoracolumbar spine.  IMPRESSION: Small bowel obstruction which appears partial, mid to high-grade. The transition point is noted in the right  lower quadrant without visible obstructing process, question adhesions. Small amount of free fluid in the mesenteric and adjacent to the liver.   Electronically Signed   By: Charlett Nose M.D.   On: 08/17/2013 11:55   Dg Abd 2 Views  08/19/2013   CLINICAL DATA:  Abdominal distention  EXAM: ABDOMEN - 2 VIEW  COMPARISON:  CT abdomen and pelvis August 17, 2013  FINDINGS: Nasogastric tube tip and side port are in the proximal stomach. There is contrast in the colon. Overall, the bowel gas pattern is unremarkable. No obstruction or free air is seen currently. There are multiple coils in the mid abdomen. There are phleboliths in the pelvis.  IMPRESSION: Nasogastric tube tip and side port are in the proximal stomach. The bowel gas pattern is overall unremarkable. Note that contrast is now seen throughout the large bowel.   Electronically Signed   By: Bretta Bang M.D.   On: 08/19/2013 21:52   Acute Abdominal Series  08/15/2013   CLINICAL DATA:  Nausea and vomiting with diarrhea for 4 days.  EXAM: ACUTE ABDOMEN SERIES (ABDOMEN 2 VIEW & CHEST 1 VIEW)  COMPARISON:  Chest radiograph 11/23/2012.  FINDINGS: Median sternotomy/ CABG. Prominent left-sided pericardial fat pad. Scattered areas of atelectasis and and scarring. No free air underneath the hemidiaphragms. Dilated loops of small bowel are present, with a paucity of rectal gas and stool. The appearance is compatible with small bowel obstruction. Mural thickening of dilated small bowel loops. Maximal diameter of small bowel is 4.6 cm. Fluid levels are present on the upright view. There is no plain film evidence of free air. Ventral hernia tacks are present in the central abdomen.  IMPRESSION: Findings consistent with small bowel obstruction. Enteritis is considered less likely. Postoperative changes of ventral hernia repair.   Electronically Signed   By: Andreas Newport M.D.   On: 08/15/2013 20:56    History of Present Illness: Pt readmitted for recurrence  of pSBO  Hospital Course: pSBO - [facility] was consulted. Diet was held. NGT placed to suction. Symptoms of distension, nausea, emesis resolved w/ conservation measures alone. For 24 hours after clamping and removing NGT, patient was passing gas, no complaints of nausea/emesis, had normal BM overnight. He was tolerating soft diet w/o any complications. Given symptom resolution, he was stable to discharge home from medical and surgical standpoint and was educated clearly on slow advancement of diet and the need to ensure daily BMs for the next 2 weeks at least. Educated on soft foods, high water intake, stool softeners or laxatives prn. He will f/u w/ me in 1-2 weeks   CAD s/p  CABG in past - cards was consulted b/c of possibility and surgery and the need to hold plavix. They cleared him for surgery if needed (but it was not ever needed) and stated it was safe to stop his plavix and only continue w/ ASA 81 daily given he never had stents placed and his CABG was in remote past. We will follow these orders on discharge and plavix has been D/C'd   Day of Discharge Exam BP 102/39  Pulse 58  Temp(Src) 97.7 F (36.5 C) (Oral)  Resp 18  Ht 5\' 11"  (1.803 m)  Wt 194 lb 1.6 oz (88.043 kg)  BMI 27.08 kg/m2  SpO2 96%  Physical Exam: General appearance: WM in NAD  Eyes: no scleral icterus, MMM Throat: oropharynx moist without erythema Resp: CTAB, no wheezes or rales  Cardio: RRR, no MRG  GI: soft, non-tender; bowel sounds normal; no masses,  no organomegaly Extremities: no clubbing, cyanosis or edema  Discharge Labs:  Recent Labs  08/20/13 0525 08/21/13 0450  NA 136 136*  K 3.9 4.4  CL 97 98  CO2 31 29  GLUCOSE 142* 128*  BUN 9 8  CREATININE 0.91 1.00  CALCIUM 8.6 8.8    Recent Labs  08/20/13 0525 08/21/13 0450  AST 22 23  ALT 20 20  ALKPHOS 33* 35*  BILITOT 0.5 0.6  PROT 6.3 6.4  ALBUMIN 3.0* 3.1*    Recent Labs  08/20/13 0525 08/21/13 0450  WBC 7.6 6.9  NEUTROABS 5.2 4.7   HGB 13.4 13.6  HCT 37.8* 37.9*  MCV 87.3 87.5  PLT 177 181   No results found for this basename: INR, PROTIME   No results found for this basename: CKTOTAL, CKMB, CKMBINDEX, TROPONINI,  in the last 72 hours No results found for this basename: TSH, T4TOTAL, FREET3, T3FREE, THYROIDAB,  in the last 72 hours No results found for this basename: VITAMINB12, FOLATE, FERRITIN, TIBC, IRON, RETICCTPCT,  in the last 72 hours  Discharge instructions:     Discharge Orders   Future Appointments Provider Department Dept Phone   08/30/2013 2:15 PM Lennette Bihari, MD [facility] [phone]   09/04/2013 10:00 AM Waymon Budge, MD [facility] [phone]   09/11/2013 9:00 AM Maryln Gottron, MD [facility] [phone]   Joint Appt [facility] [phone]   Future Orders Complete By Expires   Diet - low sodium heart healthy  As directed    Discharge instructions  As directed    Comments:     Please take soft diet for the next 1-2 weeks and ensure daily BMs w/ hydration, fiber, and stool softeners as needed. Call office if no BMs for 2+ days.   Increase activity slowly  As directed      01-Home or Self Care   Disposition: home  Follow-up Appts: Follow-up with Dr. Link Snuffer at [facility] in 1-2 weeks.  We will Call for appointment.  Condition on Discharge: stable  Tests Needing Follow-up: none  Time spent in discharge (includes decision making & examination of pt): 45 minutes    Signed: Terrell Shimko 08/21/2013, 9:12 AM

## 2013-08-21 NOTE — Progress Notes (Signed)
Doing well. Advance diet as tolerated and discharge per primary team. Should follow-up with [facility] if his umbilical hernia becomes larger or more symptomatic.  Wilmon Arms. Corliss Skains, MD, [facility]  General/ Trauma Surgery  08/21/2013 9:37 AM

## 2013-08-21 NOTE — Progress Notes (Signed)
  Subjective: Pt's pain resolved.  No N/V.  NG out yesterday.  Tolerating clears and now fulls.  Wants to go home.  Had a normal BM this am.  Ambulating well.  Objective: Vital signs in last 24 hours: Temp:  [97.7 F (36.5 C)-98.2 F (36.8 C)] 97.7 F (36.5 C) (12/30 0436) Pulse Rate:  [58-90] 58 (12/30 0436) Resp:  [18] 18 (12/30 0436) BP: (102-122)/(39-73) 102/39 mmHg (12/30 0436) SpO2:  [90 %-96 %] 96 % (12/30 0436) Last BM Date: 08/21/13  Intake/Output from previous day: 12/29 0701 - 12/30 0700 In: 60 [P.O.:60] Out: -  Intake/Output this shift:    PE: Gen:  Alert, NAD, pleasant Abd: Soft, NT/ND, +BS, no HSM, umbilical hernia noted and soft  Lab Results:   Recent Labs  08/20/13 0525 08/21/13 0450  WBC 7.6 6.9  HGB 13.4 13.6  HCT 37.8* 37.9*  PLT 177 181   BMET  Recent Labs  08/20/13 0525 08/21/13 0450  NA 136 136*  K 3.9 4.4  CL 97 98  CO2 31 29  GLUCOSE 142* 128*  BUN 9 8  CREATININE 0.91 1.00  CALCIUM 8.6 8.8   PT/INR No results found for this basename: LABPROT, INR,  in the last 72 hours CMP     Component Value Date/Time   NA 136* 08/21/2013 0450   K 4.4 08/21/2013 0450   CL 98 08/21/2013 0450   CO2 29 08/21/2013 0450   GLUCOSE 128* 08/21/2013 0450   BUN 8 08/21/2013 0450   CREATININE 1.00 08/21/2013 0450   CREATININE 1.00 05/04/2013 1624   CALCIUM 8.8 08/21/2013 0450   PROT 6.4 08/21/2013 0450   ALBUMIN 3.1* 08/21/2013 0450   AST 23 08/21/2013 0450   ALT 20 08/21/2013 0450   ALKPHOS 35* 08/21/2013 0450   BILITOT 0.6 08/21/2013 0450   GFRNONAA 74* 08/21/2013 0450   GFRAA 86* 08/21/2013 0450   Lipase     Component Value Date/Time   LIPASE 23 08/17/2013 0940       Studies/Results: Dg Abd 2 Views  2013-09-09   CLINICAL DATA:  Abdominal distention  EXAM: ABDOMEN - 2 VIEW  COMPARISON:  CT abdomen and pelvis August 17, 2013  FINDINGS: Nasogastric tube tip and side port are in the proximal stomach. There is contrast in the  colon. Overall, the bowel gas pattern is unremarkable. No obstruction or free air is seen currently. There are multiple coils in the mid abdomen. There are phleboliths in the pelvis.  IMPRESSION: Nasogastric tube tip and side port are in the proximal stomach. The bowel gas pattern is overall unremarkable. Note that contrast is now seen throughout the large bowel.   Electronically Signed   By: Bretta Bang M.D.   On: September 09, 2013 21:52    Anti-infectives: Anti-infectives   None       Assessment/Plan Small bowel obstruction  Nausea and vomiting  Abdominal pain/distension  Multiple chronic medical and cardiac conditions   Plan:  1. Improved and tolerating full liquids, advance to soft diet 2. NG tube d/ce'd yesterday, had a BM this am 3. Ambulate and IS  4. SCD's and dvt proph per primary team 5. No specific follow up with surgery needed, likely d/c home per primary service       LOS: 4 days    Aris Georgia 08/21/2013, 8:45 AM Pager: [phone]

## 2013-08-21 NOTE — Progress Notes (Signed)
Subjective: Had BM last night Passing plenty of gas Zero nausea w/ soft meal this AM (grits, etc)  Ambulating well Ready to eat a normal meal and go home if able    Objective: Vital signs in last 24 hours: Temp:  [97.7 F (36.5 C)-98.2 F (36.8 C)] 97.7 F (36.5 C) (12/30 0436) Pulse Rate:  [58-90] 58 (12/30 0436) Resp:  [18] 18 (12/30 0436) BP: (102-122)/(39-73) 102/39 mmHg (12/30 0436) SpO2:  [90 %-96 %] 96 % (12/30 0436) Weight change:  Last BM Date: 08/21/13  Intake/Output from previous day: 12/29 0701 - 12/30 0700 In: 60 [P.O.:60] Out: -  Intake/Output this shift:    Gen: WM w/  NAD  HEENT: MMM, trachea midline, NGT in nare w/ tape in place Card: RRR, no MRG  PUlm: CTAB, no crackles, wheezes  Abd: not distended. Relatively soft. Not tender. Hyperactive BS Ext:  No edema or cyanosis    Lab Results:  Recent Labs  08/20/13 0525 08/21/13 0450  WBC 7.6 6.9  HGB 13.4 13.6  HCT 37.8* 37.9*  PLT 177 181   BMET  Recent Labs  08/20/13 0525 08/21/13 0450  NA 136 136*  K 3.9 4.4  CL 97 98  CO2 31 29  GLUCOSE 142* 128*  BUN 9 8  CREATININE 0.91 1.00  CALCIUM 8.6 8.8   CMET CMP     Component Value Date/Time   NA 136* 08/21/2013 0450   K 4.4 08/21/2013 0450   CL 98 08/21/2013 0450   CO2 29 08/21/2013 0450   GLUCOSE 128* 08/21/2013 0450   BUN 8 08/21/2013 0450   CREATININE 1.00 08/21/2013 0450   CREATININE 1.00 05/04/2013 1624   CALCIUM 8.8 08/21/2013 0450   PROT 6.4 08/21/2013 0450   ALBUMIN 3.1* 08/21/2013 0450   AST 23 08/21/2013 0450   ALT 20 08/21/2013 0450   ALKPHOS 35* 08/21/2013 0450   BILITOT 0.6 08/21/2013 0450   GFRNONAA 74* 08/21/2013 0450   GFRAA 86* 08/21/2013 0450     Studies/Results: Dg Abd 2 Views  09/18/2013   CLINICAL DATA:  Abdominal distention  EXAM: ABDOMEN - 2 VIEW  COMPARISON:  CT abdomen and pelvis August 17, 2013  FINDINGS: Nasogastric tube tip and side port are in the proximal stomach. There is contrast in the  colon. Overall, the bowel gas pattern is unremarkable. No obstruction or free air is seen currently. There are multiple coils in the mid abdomen. There are phleboliths in the pelvis.  IMPRESSION: Nasogastric tube tip and side port are in the proximal stomach. The bowel gas pattern is overall unremarkable. Note that contrast is now seen throughout the large bowel.   Electronically Signed   By: Bretta Bang M.D.   On: 09/18/13 21:52    Medications: I have reviewed the patient's current medications.  . enoxaparin (LOVENOX) injection  40 mg Subcutaneous Q24H  . metoprolol  50 mg Oral BID  . ranolazine  500 mg Oral BID     Assessment/Plan:  Active Problems:   #CAD (coronary artery disease) s/p CABG x 4 in 2006-stable.  plavix held and will not be resumed. Resuming ASA 81 mg daily at discharge   # Hyperlipidemia LDL goal < 70-follow. Resume home zetia 10, crestor 20, and lovaza 1 gm, niacin 1000 at discharge  # Partial small bowel obstruction-management per surgery. Improved and had BM last night and NGT out. Ok for discharge home if tolerates full lunch.   # prostate cancer - currently w/ lupron q6  mo per Dr Margarita Grizzle and xrt per Dr Dayton Scrape  # OSA - resume home CPAP once NGT is removed  # HTN - metop 50 BID  # COPD - proair prn only at home  # Dispo - home today if tolerates lunch. If does not tolerate meal, will need continued monitoring.    LOS: 4 days   Alysia Penna, MD 08/21/2013, 8:37 AM

## 2013-08-30 ENCOUNTER — Ambulatory Visit (INDEPENDENT_AMBULATORY_CARE_PROVIDER_SITE_OTHER): Payer: Medicare Other | Admitting: Cardiovascular Disease

## 2013-08-30 VITALS — BP 130/78 | HR 85 | Ht 71.0 in | Wt 198.9 lb

## 2013-08-30 DIAGNOSIS — G4733 Obstructive sleep apnea (adult) (pediatric): Secondary | ICD-10-CM

## 2013-08-30 DIAGNOSIS — J438 Other emphysema: Secondary | ICD-10-CM

## 2013-08-30 DIAGNOSIS — I2510 Atherosclerotic heart disease of native coronary artery without angina pectoris: Secondary | ICD-10-CM | POA: Diagnosis not present

## 2013-08-30 DIAGNOSIS — E785 Hyperlipidemia, unspecified: Secondary | ICD-10-CM | POA: Diagnosis not present

## 2013-08-30 DIAGNOSIS — J439 Emphysema, unspecified: Secondary | ICD-10-CM

## 2013-08-30 DIAGNOSIS — C61 Malignant neoplasm of prostate: Secondary | ICD-10-CM

## 2013-08-30 MED ORDER — METOPROLOL SUCCINATE ER 25 MG PO TB24: 25.0000 mg | ORAL_TABLET | Freq: Every day | ORAL | Status: DC

## 2013-08-30 MED ORDER — NIACIN ER (ANTIHYPERLIPIDEMIC) 1000 MG PO TBCR: 1000.0000 mg | EXTENDED_RELEASE_TABLET | Freq: Every day | ORAL | Status: DC

## 2013-08-30 MED ORDER — RANOLAZINE ER 500 MG PO TB12: 500.0000 mg | ORAL_TABLET | Freq: Two times a day (BID) | ORAL | Status: DC

## 2013-08-30 MED ORDER — EZETIMIBE 10 MG PO TABS
10.0000 mg | ORAL_TABLET | Freq: Every morning | ORAL | Status: DC
Start: 2013-08-30 — End: 2014-01-16

## 2013-08-30 MED ORDER — OMEGA-3-ACID ETHYL ESTERS 1 G PO CAPS: 1.0000 g | ORAL_CAPSULE | Freq: Every day | ORAL | Status: DC

## 2013-08-30 NOTE — Patient Instructions (Signed)
Your physician recommends that you schedule a follow-up appointment in: 3 months.  Your physician has recommended you make the following change in your medication: Restart and take all medications as directed.refills has been sent to the pharmacy.

## 2013-09-04 ENCOUNTER — Encounter: Payer: Self-pay | Admitting: Internal Medicine

## 2013-09-04 ENCOUNTER — Ambulatory Visit (INDEPENDENT_AMBULATORY_CARE_PROVIDER_SITE_OTHER): Payer: Medicare Other | Admitting: Internal Medicine

## 2013-09-04 VITALS — BP 134/68 | HR 85 | Ht 71.0 in | Wt 201.2 lb

## 2013-09-04 DIAGNOSIS — J441 Chronic obstructive pulmonary disease with (acute) exacerbation: Secondary | ICD-10-CM | POA: Diagnosis not present

## 2013-09-04 DIAGNOSIS — G4733 Obstructive sleep apnea (adult) (pediatric): Secondary | ICD-10-CM

## 2013-09-04 DIAGNOSIS — J439 Emphysema, unspecified: Secondary | ICD-10-CM

## 2013-09-04 DIAGNOSIS — J438 Other emphysema: Secondary | ICD-10-CM

## 2013-09-04 NOTE — Patient Instructions (Signed)
Order-   DME Advanced ONOX on room air, off CPAP      Dx COPD, OSA

## 2013-09-04 NOTE — Progress Notes (Signed)
78 yoM self referred for  COPD with emphysema diagnosed 7322, complicated by CAD/CABG 4V, DM.       Here with wife Smoked one pack per day until quitting in 2000. Retired from [employer] with associated dust. 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 He says this was associated with asbestos exposure from brake dust. Issues are in legal consideration and he notes that no diagnosis has been made. Has had pneumonia twice, and pneumonia vaccine. History of sleep apnea diagnosis but quit CPAP. Wife confirms he snores wearing a chin strap and sleeping in a recliner. Significant injury at work 01/17/1999, broken tibia and "degloving" of R  lower leg.  01/10/13- self referred for  COPD with emphysema diagnosed 0254, OSA, complicated by CAD/CABG 4V, DM, hx major trauma leg.       Here with wife Mowing the yard with mask in dusty but will still cough. Wife says if he lies down a recliner he holds his breath saturation will drop to 86%.he says he has been wearing his CPAP again/Advanced, but she describes apneas when he is sleeping without it.  PFT 01/01/2013: Minimal obstructive airways disease, air trapping with increased residual volume, mild response to bronchodilator, diffusion slightly reduced. FVC 4.11/96%, FEV1 2.59/82%, FEV1/FVC 0.63/85%, FEF 25-75% 1.63/68%. TLC 100%, DLCO 76%. CXR 11/23/12 IMPRESSION:  Underlying emphysema. No edema or consolidation.  Original Report Authenticated By: Lowella Grip, M.D.  03/05/13- self referred for  COPD with emphysema diagnosed 2706, OSA, complicated by CAD/CABG 4V, DM, hx major trauma leg.       Here with wife FOLLOWS FOR: pt reports breathing is doing well-- states concentrator is making a "racket and is on its last leg"-- denies any other concerns at this time. CPAP Autopap/ Advanced Mask hurts bridge of nose if he stretches it on too tightly. Oxygen concentrator is getting loud. Compliance okay but inadequate control  based on download. Needs broader pressure range for autotitration.  09/04/13- 65 yoM former smoker self referred for  COPD with emphysema, hx asbestos exposure, OSA, complicated by CAD/CABG 4V, DM, hx major trauma leg.       Here with wife CPAP AutoPAP Advanced- not using-blows too hard. Does wearing oxygen 3 L/Advanced for sleep. Wife says she rarely notices apneas now when he is not wearing CPAP. FOLLOWS FOR: Pt states breathing is doing well since last visit; recent hospital for intestine blockage. Pending XRT for prostate cancer  ROS-see HPI Constitutional:   No-   weight loss, night sweats, fevers, chills, +fatigue, lassitude. HEENT:   No-  headaches, difficulty swallowing, tooth/dental problems, sore throat,       No-  sneezing, itching, ear ache, nasal congestion, post nasal drip,  CV:  No-   chest pain, orthopnea, PND, swelling in lower extremities, anasarca, dizziness, palpitations Resp: + shortness of breath with exertion or at rest.              No-   productive cough,  No non-productive cough,  No- coughing up of blood.              No-   change in color of mucus.  No- wheezing.   Skin: No-   rash or lesions. GI:  No-   heartburn, indigestion, abdominal pain, nausea, vomiting, GU:  MS:  No-   joint pain or swelling.  . Neuro-     nothing unusual Psych:  No- change in mood or affect. + depression or anxiety.  No memory loss.  OBJ- Physical Exam General- Alert, Oriented, Affect-appropriate, Distress- none acute. Trim Skin- rash-none, lesions- none, excoriation- none Lymphadenopathy- none Head- atraumatic            Eyes- Gross vision intact, PERRLA, conjunctivae and secretions clear            Ears- Hearing, canals-normal            Nose-  Clear, no-Septal dev, mucus, polyps, erosion, perforation             Throat- Mallampati II , mucosa clear , drainage- none, tonsils- atrophic Neck- flexible , trachea midline, no stridor , thyroid nl, carotid no bruit Chest - symmetrical  excursion , unlabored           Heart/CV- RRR , no murmur , no gallop  , no rub, nl s1 s2                           - JVD- none , edema- none, stasis changes- none, varices- none           Lung- clear to P&A, wheeze- none, cough- none , dullness-none, rub- none           Chest wall-  Abd-  Br/ Gen/ Rectal- Not done, not indicated Extrem- cyanosis- none, clubbing, none, atrophy- none, strength- nl. -+cane. Support stockings Neuro- grossly intact to observation

## 2013-09-10 ENCOUNTER — Other Ambulatory Visit: Payer: Self-pay | Admitting: Radiation Oncology

## 2013-09-10 DIAGNOSIS — H2589 Other age-related cataract: Secondary | ICD-10-CM | POA: Diagnosis not present

## 2013-09-10 DIAGNOSIS — E119 Type 2 diabetes mellitus without complications: Secondary | ICD-10-CM | POA: Diagnosis not present

## 2013-09-11 ENCOUNTER — Ambulatory Visit
Admission: RE | Admit: 2013-09-11 | Discharge: 2013-09-11 | Disposition: A | Discharge status: Home / Self Care | Payer: Medicare Other | Source: Ambulatory Visit | Attending: Radiation Oncology | Admitting: Radiation Oncology

## 2013-09-11 DIAGNOSIS — R5381 Other malaise: Secondary | ICD-10-CM | POA: Diagnosis not present

## 2013-09-11 DIAGNOSIS — N342 Other urethritis: Secondary | ICD-10-CM | POA: Insufficient documentation

## 2013-09-11 DIAGNOSIS — R5383 Other fatigue: Secondary | ICD-10-CM

## 2013-09-11 DIAGNOSIS — R350 Frequency of micturition: Secondary | ICD-10-CM | POA: Insufficient documentation

## 2013-09-11 DIAGNOSIS — R300 Dysuria: Secondary | ICD-10-CM | POA: Insufficient documentation

## 2013-09-11 DIAGNOSIS — Z510 Encounter for antineoplastic radiation therapy: Secondary | ICD-10-CM | POA: Insufficient documentation

## 2013-09-11 DIAGNOSIS — R351 Nocturia: Secondary | ICD-10-CM | POA: Insufficient documentation

## 2013-09-11 DIAGNOSIS — C61 Malignant neoplasm of prostate: Secondary | ICD-10-CM

## 2013-09-11 DIAGNOSIS — R3915 Urgency of urination: Secondary | ICD-10-CM | POA: Insufficient documentation

## 2013-09-11 NOTE — Progress Notes (Signed)
Complex simulation/treatment planning note: The patient was taken to the CT simulator and placed supine. A Vac lock immobilization device was constructed for immobilization. A red rubber tube was placed within the rectal vault. I then performed a urethrogram. He was then scanned. I chose an isocenter along the central prostate. The CT data set was sent to [facility] where I contoured his prostate, seminal vesicles, rectum, bladder, and rectosigmoid colon. I am prescribing 7800 cGy in 40 sessions utilizing 6 MV photons with the prostate PTV representing the prostate was 0.8 cm except for 0.5 cm along the rectum. I prescribing 5600 cGy in 40 sessions to his seminal vesicle PTV which  represents his seminal vesicles plus 0.5 cm. He is to be treated with a company full bladder. He'll undergo daily cone beam CT, setting up to his prostate/rectum. He does not have gold seeds. He'll be treated with a comfortably full bladder.

## 2013-09-12 DIAGNOSIS — R300 Dysuria: Secondary | ICD-10-CM | POA: Diagnosis not present

## 2013-09-12 DIAGNOSIS — N342 Other urethritis: Secondary | ICD-10-CM | POA: Diagnosis not present

## 2013-09-12 DIAGNOSIS — R5381 Other malaise: Secondary | ICD-10-CM | POA: Diagnosis not present

## 2013-09-12 DIAGNOSIS — Z510 Encounter for antineoplastic radiation therapy: Secondary | ICD-10-CM | POA: Diagnosis not present

## 2013-09-12 DIAGNOSIS — R3915 Urgency of urination: Secondary | ICD-10-CM | POA: Diagnosis not present

## 2013-09-12 DIAGNOSIS — C61 Malignant neoplasm of prostate: Secondary | ICD-10-CM | POA: Diagnosis not present

## 2013-09-13 ENCOUNTER — Encounter: Payer: Self-pay | Admitting: Radiation Oncology

## 2013-09-13 DIAGNOSIS — Z510 Encounter for antineoplastic radiation therapy: Secondary | ICD-10-CM | POA: Diagnosis not present

## 2013-09-13 DIAGNOSIS — C61 Malignant neoplasm of prostate: Secondary | ICD-10-CM | POA: Diagnosis not present

## 2013-09-13 DIAGNOSIS — R300 Dysuria: Secondary | ICD-10-CM | POA: Diagnosis not present

## 2013-09-13 DIAGNOSIS — R3915 Urgency of urination: Secondary | ICD-10-CM | POA: Diagnosis not present

## 2013-09-13 DIAGNOSIS — R5383 Other fatigue: Secondary | ICD-10-CM | POA: Diagnosis not present

## 2013-09-13 DIAGNOSIS — N342 Other urethritis: Secondary | ICD-10-CM | POA: Diagnosis not present

## 2013-09-13 DIAGNOSIS — R5381 Other malaise: Secondary | ICD-10-CM | POA: Diagnosis not present

## 2013-09-13 NOTE — Progress Notes (Signed)
IMRT simulation/treatment planning note: The patient completed IMRT simulation and treatment planning on 09/12/2013. IMRT was chosen to decrease the risk for both acute bladder and rectal toxicity compared to conventional or 3-D conformal radiation therapy. Dose volume histograms were obtained for the prostate and seminal vesicles in addition to avoidance structures including the bladder, rectum, and femoral heads. We met our departmental guidelines. I prescribing 7800 cGy in 40 sessions to his prostate PTV and 5600 cGy in 40 sessions to his seminal vesicle PTV. He is to be treated with a comfortably full bladder and he'll undergo daily cone beam CT, setting up to his prostate for image guidance.

## 2013-09-15 ENCOUNTER — Encounter: Payer: Self-pay | Admitting: Cardiovascular Disease

## 2013-09-15 NOTE — Progress Notes (Signed)
Patient ID: Daniel Singleton, male   DOB: June 17, 1941, 73 y.o.   MRN: [id number]        HPI:  [name] is a 73 year old gentleman who is followed Dr. [name] for primary medical care. He established neurologic care with me in August 2014..   [name] is a gentleman with a history of hypertension, hyperlipidemia, COPD, prior asbestos exposure, and diabetes mellitus. In January 2006, he underwent CABG surgery x4  in [facility] by Dr. Zorita Singleton. I do not have the specifics of his bypass grafts. He states that last year, he did undergo a stress test in [place] after he had experienced some recurrent chest pain episodes. A catheterization showed mild blockages.  He also has a history of obstructive sleep apnea on CPAP therapy with 3 L of oxygen.  Mr. Gewirtz does admit to some shortness of breath.  He is unaware of tachycardia palpitations. He does have hyperlipidemia. Apparently he had been on isosorbide mononitrate therapy but stopped taking this because of libido concerns and potential need for medications for erectile function.  He did undergo an echo Doppler study which was done on 04/03/2013 which showed an ejection fraction of 55-60%. Mild tricuspid regurgitation. He did have grade 1 diastolic dysfunction. When I saw him, I recommended that we perform advanced lipid testing with NMR lipoprotein of since he has had low HDL levels and her recent cholesterol was 185 triglycerides 153 HDL 31 and LDL 123. At that time, I also started him on Ranexa 500 mg twice a day since he had discontinued his isosorbide. I added Zetia 10 mg to his atorvastatin and recommended that he undergo a followup with NMR lipoprotein assessment.  His NMR study was markedly abnormal. Specifically, his total cholesterol was 187 triglycerides 348 HDL 32 LDL cholesterol 85. However, he had markedly increased goal LDL particles at 1739 status LDL particle number was markedly increased at 2463. The patient  tells me he did develop some myalgias on Lipitor and therefore for approximately the last 3-4 weeks completely discontinued a torus that in therapy.  He also tells me that last week he had a prostate biopsy. He was just notified by his urologist that this was positive for cancer. He'll be seeing the urologist and followup evaluation to discuss a treatment and further diagnostic strategy.  He was recently hospitalized on December 26 through 08/21/2013 abdominal discomfort due to partial small bowel obstruction. He had experienced nausea and vomiting with diarrhea for 4 days prior to his admission and was found to have a partial mid-to high grade small bowel obstruction with a transition point in the right lower quadrant. He was cleared for surgery and if surgery was to be done he was to stop his Plavix and continue aspirin. Fortunately, his bowel obstruction resolved with medical management and surgical intervention was not necessary. Subsequently, he has continued to do well. He denies recent abdominal pain or chest pain. He presents now for cardiology followup evaluation.   Past Medical History  Diagnosis Date  . Hypertension   . Emphysema   . Diabetes   . Hypercholesteremia   . PONV (postoperative nausea and vomiting)   . Angina at rest   . Coronary artery disease     recently started seeing Dr. Claiborne Singleton   . Arthritis   . GERD (gastroesophageal reflux disease)   . Elevated PSA   . Sleep apnea     c pap with oxygen at 2.5 L  . Prostate  cancer 05/23/13    gleason 3+4=7, volume 31 cc  . Asthma   . Depression   . Anxiety     Past Surgical History  Procedure Laterality Date  . Heart bypass  2006    Quad  . Degloving rt leg injury  2000    had skin grafts  . Hernia repair  2008    x 3 w/mesh, bilat inguinla, umbilical  . Back surgery  2006    discectomy lumbar  . Tonsillectomy      w/adenoidectomy  . Prostate biopsy N/A 05/23/2013    Procedure: BIOPSY TRANSRECTAL ULTRASONIC PROSTATE  (TUBP);  Surgeon: Molli Hazard, MD;  Location: [facility];  Service: Urology;  Laterality: N/A;  prostate nerve block  . Back surgery    . Foot surgery    . Back surgery      No Known Allergies  Current Outpatient Prescriptions  Medication Sig Dispense Refill  . albuterol (PROVENTIL HFA;VENTOLIN HFA) 108 (90 BASE) MCG/ACT inhaler Inhale 2 puffs into the lungs every 6 (six) hours as needed for wheezing.  1 Inhaler  3  . aspirin 81 MG tablet Take 1 tablet (81 mg total) by mouth daily.  30 tablet    . DULoxetine (CYMBALTA) 60 MG capsule Take 60 mg by mouth daily.      Marland Kitchen ezetimibe (ZETIA) 10 MG tablet Take 1 tablet (10 mg total) by mouth every morning.  30 tablet  11  . Melatonin 10 MG CAPS Take 10 mg by mouth daily.      . niacin (NIASPAN) 1000 MG CR tablet Take 1 tablet (1,000 mg total) by mouth at bedtime.  30 tablet  11  . nitroGLYCERIN (NITROSTAT) 0.3 MG SL tablet Place 0.3 mg under the tongue every 5 (five) minutes as needed for chest pain.      Marland Kitchen omega-3 acid ethyl esters (LOVAZA) 1 G capsule Take 1 capsule (1 g total) by mouth daily.  30 capsule  11  . ondansetron (ZOFRAN-ODT) 4 MG disintegrating tablet Take 4 mg by mouth every 6 (six) hours as needed.      . ranolazine (RANEXA) 500 MG 12 hr tablet Take 1 tablet (500 mg total) by mouth 2 (two) times daily.  60 tablet  11  . rosuvastatin (CRESTOR) 20 MG tablet Take 20 mg by mouth daily.      . metoprolol succinate (TOPROL XL) 25 MG 24 hr tablet Take 1 tablet (25 mg total) by mouth daily.  90 tablet  3   No current facility-administered medications for this visit.    Social history is notable in that he is married to his 2 children ages 66 and 36. He smoked one pack of cigarettes per day until 2000. He does drink occasional alcohol. He does walk  Family History  Problem Relation Age of Onset  . Heart disease Mother   . Heart attack Mother   . Cancer Sister   . Depression Sister     ROS is negative for fever chills or night  sweats. He denies visual symptoms. He denies change in vision. He is unaware of lymphadenopathy. He denies increasing shortness of breath, cough, or wheezing. He does have sleep apnea and has 3 L of oxygen bled into his CPAP machine. He denies recent chest pain but apparently last year for chest pain apparently had a repeat stress test done in [place] which lead to catheterization and this revealed mild blockages.  There is a remote degloving injury many years ago.  He denies restless legs. He denies edema. He denies myalgias. He is unaware of any hematuria. He denies recurrent abdominal discomfort or diarrhea. There is no edema. He denies cold or heat intolerance. Other comprehensive 14 point system review is negative. PE BP 130/78  Pulse 85  Ht 5\' 11"  (1.803 m)  Wt 198 lb 14.4 oz (90.22 kg)  BMI 27.75 kg/m2 General: Alert, oriented, no distress.  Skin: normal turgor, no rashes HEENT: Normocephalic, atraumatic. Pupils round and reactive; sclera anicteric; Fundi no hemorrhages or exudates Nose without nasal septal hypertrophy Mouth/Parynx benign; Mallinpatti scale 3 Neck: No JVD, no carotid briuts Lungs: clear to ausculatation and percussion; no wheezing or rales Chest wall: No tenderness to palpation Heart: RRR, s1 s2 normal 1/6 systolic murmur Abdomen: soft, nontender; no hepatosplenomehaly, BS+; abdominal aorta nontender and not dilated by palpation. Back: No CVA tenderness. Pulses 2+ Extremities: no clubbinbg cyanosis or edema, Homan's sign negative  Neurologic: grossly nonfocal Psychological: Normal affect and mood; normal cognition    ECG independently read by me today shows normal sinus rhythm with sinus arrhythmia with a ventricular rate in the 80s. They're nonspecific ST-T changes.  Prior ECG in October 2014: Sinus rhythm with mild sinus arrhythmia at 60 beats per minute. PR interval 172 ms. QTC interval 384 ms.  LABS:  BMET    Component Value Date/Time   NA 136*  08/21/2013 0450     Hepatic Function Panel     Component Value Date/Time   PROT 6.4 08/21/2013 0450     CBC    Component Value Date/Time   WBC 6.9 08/21/2013 0450     BNP No results found for this basename: probnp    Lipid Panel  No results found for this basename: chol,  trig,  hdl,  cholhdl,  vldl,  ldlcalc     RADIOLOGY: No results found.   ASSESSMENT AND PLAN: My impression is that Mr. Chasen Mendell is a 73 year old gentleman with a history of diabetes mellitus, hyperlipidemia, obstructive sleep apnea on CPAP therapy with supplemental oxygen, as well as COPD. He has a prior tobacco history but he quit on 01/17/1999. He is status post CABG surgery x4 in January 2006 and last year he didn't undergo a prior cardiac catheterization which did demonstrate some mild blockages. He has felt improved since taking ranolazine 500 mg twice a day. A recent NMR profile revealed a markedly abnormal number of small LDL particles contributing to his significant elevation of LDL particles number. When I last saw him, I resumed statin therapy with Crestor 20 mg in addition to his Zetia 10 mg as well as Niaspan 1000 mg.  Prior to reinitiating therapy, his LDL particle number was 2463. His most recent LDL particle numbers now markedly improved at 801. LDL cholesterol is 53. Triglycerides are markedly improved from 348-96 and his VLDL size has also improved from 56 to 48. Presently, he does have mild sinus arrhythmia with increased pulse. He does have a status artery disease. I'm recommending that he initiate metoprolol succinate 25 mg daily with plans for dose titration depending upon tolerability. He is now off his Plavix therapy and is on aspirin. He has not had any stents in place. His blood pressure is well controlled. He is not having anginal symptoms. I will see him in 4 months for followup cardiology evaluation.  Troy Sine, MD, [facility] 09/15/2013 1:58 PM

## 2013-09-19 DIAGNOSIS — C61 Malignant neoplasm of prostate: Secondary | ICD-10-CM | POA: Diagnosis not present

## 2013-09-19 DIAGNOSIS — I2510 Atherosclerotic heart disease of native coronary artery without angina pectoris: Secondary | ICD-10-CM | POA: Diagnosis not present

## 2013-09-19 DIAGNOSIS — K56609 Unspecified intestinal obstruction, unspecified as to partial versus complete obstruction: Secondary | ICD-10-CM | POA: Diagnosis not present

## 2013-09-19 DIAGNOSIS — K219 Gastro-esophageal reflux disease without esophagitis: Secondary | ICD-10-CM | POA: Diagnosis not present

## 2013-09-19 DIAGNOSIS — Z6829 Body mass index (BMI) 29.0-29.9, adult: Secondary | ICD-10-CM | POA: Diagnosis not present

## 2013-09-19 DIAGNOSIS — E1169 Type 2 diabetes mellitus with other specified complication: Secondary | ICD-10-CM | POA: Diagnosis not present

## 2013-09-20 ENCOUNTER — Ambulatory Visit
Admission: RE | Admit: 2013-09-20 | Discharge: 2013-09-20 | Disposition: A | Discharge status: Home / Self Care | Payer: Medicare Other | Source: Ambulatory Visit | Attending: Radiation Oncology | Admitting: Radiation Oncology

## 2013-09-20 DIAGNOSIS — R3915 Urgency of urination: Secondary | ICD-10-CM | POA: Diagnosis not present

## 2013-09-20 DIAGNOSIS — R300 Dysuria: Secondary | ICD-10-CM | POA: Diagnosis not present

## 2013-09-20 DIAGNOSIS — R5383 Other fatigue: Secondary | ICD-10-CM | POA: Diagnosis not present

## 2013-09-20 DIAGNOSIS — N342 Other urethritis: Secondary | ICD-10-CM | POA: Diagnosis not present

## 2013-09-20 DIAGNOSIS — Z510 Encounter for antineoplastic radiation therapy: Secondary | ICD-10-CM | POA: Diagnosis not present

## 2013-09-20 DIAGNOSIS — C61 Malignant neoplasm of prostate: Secondary | ICD-10-CM | POA: Diagnosis not present

## 2013-09-20 DIAGNOSIS — R5381 Other malaise: Secondary | ICD-10-CM | POA: Diagnosis not present

## 2013-09-21 ENCOUNTER — Ambulatory Visit
Admission: RE | Admit: 2013-09-21 | Discharge: 2013-09-21 | Disposition: A | Discharge status: Home / Self Care | Payer: Medicare Other | Source: Ambulatory Visit | Attending: Radiation Oncology | Admitting: Radiation Oncology

## 2013-09-21 DIAGNOSIS — C61 Malignant neoplasm of prostate: Secondary | ICD-10-CM | POA: Diagnosis not present

## 2013-09-21 DIAGNOSIS — R5381 Other malaise: Secondary | ICD-10-CM | POA: Diagnosis not present

## 2013-09-21 DIAGNOSIS — R300 Dysuria: Secondary | ICD-10-CM | POA: Diagnosis not present

## 2013-09-21 DIAGNOSIS — Z510 Encounter for antineoplastic radiation therapy: Secondary | ICD-10-CM | POA: Diagnosis not present

## 2013-09-21 DIAGNOSIS — N342 Other urethritis: Secondary | ICD-10-CM | POA: Diagnosis not present

## 2013-09-21 DIAGNOSIS — R3915 Urgency of urination: Secondary | ICD-10-CM | POA: Diagnosis not present

## 2013-09-24 ENCOUNTER — Ambulatory Visit
Admission: RE | Admit: 2013-09-24 | Discharge: 2013-09-24 | Disposition: A | Discharge status: Home / Self Care | Payer: Medicare Other | Source: Ambulatory Visit | Attending: Radiation Oncology | Admitting: Radiation Oncology

## 2013-09-24 VITALS — BP 138/83 | HR 95 | Temp 97.7°F | Ht 71.0 in | Wt 199.3 lb

## 2013-09-24 DIAGNOSIS — Z510 Encounter for antineoplastic radiation therapy: Secondary | ICD-10-CM | POA: Diagnosis not present

## 2013-09-24 DIAGNOSIS — C61 Malignant neoplasm of prostate: Secondary | ICD-10-CM | POA: Diagnosis not present

## 2013-09-24 DIAGNOSIS — R300 Dysuria: Secondary | ICD-10-CM | POA: Diagnosis not present

## 2013-09-24 DIAGNOSIS — R3915 Urgency of urination: Secondary | ICD-10-CM | POA: Diagnosis not present

## 2013-09-24 DIAGNOSIS — N342 Other urethritis: Secondary | ICD-10-CM | POA: Diagnosis not present

## 2013-09-24 DIAGNOSIS — R5381 Other malaise: Secondary | ICD-10-CM | POA: Diagnosis not present

## 2013-09-24 DIAGNOSIS — R5383 Other fatigue: Secondary | ICD-10-CM | POA: Diagnosis not present

## 2013-09-24 NOTE — Progress Notes (Signed)
Weekly Management Note:  Site: Prostate Current Dose:  585  cGy Projected Dose: 7800  cGy  Narrative: The patient is seen today for routine under treatment assessment. CBCT/MVCT images/port films were reviewed. The chart was reviewed.   Bladder filling is suboptimal today. He tells me he did not have enough time to fill his bladder. No new GU or GI difficulties. We are using his prostatic calcifications for image guidance in addition to his anterior rectal wall and prostate.  Physical Examination:  Filed Vitals:   09/24/13 1704  BP: 138/83  Pulse: 95  Temp: 97.7 F (36.5 C)  .  Weight: 199 lb 4.8 oz (90.402 kg). No change.  Impression: Tolerating radiation therapy well. I encouraged him to have a comfortably full bladder.  Plan: Continue radiation therapy as planned.

## 2013-09-24 NOTE — Progress Notes (Signed)
Daniel Singleton has had 3 fractions to his prostate.  He denies pain.  He reports feeling tired.  He denies hematuria and rectal bleeding.  He reports dysuria.  He usually gets up 0-2 times per night to urinate.  He was given the radiation therapy and you book to review.

## 2013-09-25 ENCOUNTER — Encounter: Payer: Self-pay | Admitting: Radiation Oncology

## 2013-09-25 ENCOUNTER — Ambulatory Visit
Admission: RE | Admit: 2013-09-25 | Discharge: 2013-09-25 | Disposition: A | Discharge status: Home / Self Care | Payer: Medicare Other | Source: Ambulatory Visit | Attending: Radiation Oncology | Admitting: Radiation Oncology

## 2013-09-25 DIAGNOSIS — N342 Other urethritis: Secondary | ICD-10-CM | POA: Diagnosis not present

## 2013-09-25 DIAGNOSIS — R5381 Other malaise: Secondary | ICD-10-CM | POA: Diagnosis not present

## 2013-09-25 DIAGNOSIS — Z510 Encounter for antineoplastic radiation therapy: Secondary | ICD-10-CM | POA: Diagnosis not present

## 2013-09-25 DIAGNOSIS — R300 Dysuria: Secondary | ICD-10-CM | POA: Diagnosis not present

## 2013-09-25 DIAGNOSIS — C61 Malignant neoplasm of prostate: Secondary | ICD-10-CM | POA: Diagnosis not present

## 2013-09-25 DIAGNOSIS — R3915 Urgency of urination: Secondary | ICD-10-CM | POA: Diagnosis not present

## 2013-09-25 DIAGNOSIS — R5383 Other fatigue: Secondary | ICD-10-CM | POA: Diagnosis not present

## 2013-09-25 NOTE — Progress Notes (Signed)
Clinic note: The patient is seen today after his fourth treatment. He states that he felt a warm  sensation along his scrotum which woke him up this morning. He's had this sensation on and off during the day. He denies fevers, chills, or change in urination.  Physical examination: Examination the genitalia is within normal limits.  Impression: He may be having referred discomfort from his prostatic radiation therapy. I told that he may want to try ibuprofen when necessary.   Plan: I'll see him for his weekly treatment management next Monday.

## 2013-09-26 ENCOUNTER — Ambulatory Visit
Admission: RE | Admit: 2013-09-26 | Discharge: 2013-09-26 | Disposition: A | Discharge status: Home / Self Care | Payer: Medicare Other | Source: Ambulatory Visit | Attending: Radiation Oncology | Admitting: Radiation Oncology

## 2013-09-26 DIAGNOSIS — R5381 Other malaise: Secondary | ICD-10-CM | POA: Diagnosis not present

## 2013-09-26 DIAGNOSIS — R3915 Urgency of urination: Secondary | ICD-10-CM | POA: Diagnosis not present

## 2013-09-26 DIAGNOSIS — R300 Dysuria: Secondary | ICD-10-CM | POA: Diagnosis not present

## 2013-09-26 DIAGNOSIS — R5383 Other fatigue: Secondary | ICD-10-CM | POA: Diagnosis not present

## 2013-09-26 DIAGNOSIS — N342 Other urethritis: Secondary | ICD-10-CM | POA: Diagnosis not present

## 2013-09-26 DIAGNOSIS — C61 Malignant neoplasm of prostate: Secondary | ICD-10-CM | POA: Diagnosis not present

## 2013-09-26 DIAGNOSIS — Z510 Encounter for antineoplastic radiation therapy: Secondary | ICD-10-CM | POA: Diagnosis not present

## 2013-09-27 ENCOUNTER — Ambulatory Visit
Admission: RE | Admit: 2013-09-27 | Discharge: 2013-09-27 | Disposition: A | Discharge status: Home / Self Care | Payer: Medicare Other | Source: Ambulatory Visit | Attending: Radiation Oncology | Admitting: Radiation Oncology

## 2013-09-27 DIAGNOSIS — C61 Malignant neoplasm of prostate: Secondary | ICD-10-CM | POA: Diagnosis not present

## 2013-09-27 DIAGNOSIS — N342 Other urethritis: Secondary | ICD-10-CM | POA: Diagnosis not present

## 2013-09-27 DIAGNOSIS — R300 Dysuria: Secondary | ICD-10-CM | POA: Diagnosis not present

## 2013-09-27 DIAGNOSIS — Z510 Encounter for antineoplastic radiation therapy: Secondary | ICD-10-CM | POA: Diagnosis not present

## 2013-09-27 DIAGNOSIS — R3915 Urgency of urination: Secondary | ICD-10-CM | POA: Diagnosis not present

## 2013-09-27 DIAGNOSIS — R5381 Other malaise: Secondary | ICD-10-CM | POA: Diagnosis not present

## 2013-09-28 ENCOUNTER — Ambulatory Visit
Admission: RE | Admit: 2013-09-28 | Discharge: 2013-09-28 | Disposition: A | Discharge status: Home / Self Care | Payer: Medicare Other | Source: Ambulatory Visit | Attending: Radiation Oncology | Admitting: Radiation Oncology

## 2013-09-28 DIAGNOSIS — C61 Malignant neoplasm of prostate: Secondary | ICD-10-CM | POA: Diagnosis not present

## 2013-09-28 DIAGNOSIS — Z510 Encounter for antineoplastic radiation therapy: Secondary | ICD-10-CM | POA: Diagnosis not present

## 2013-09-28 DIAGNOSIS — N342 Other urethritis: Secondary | ICD-10-CM | POA: Diagnosis not present

## 2013-09-28 DIAGNOSIS — R5383 Other fatigue: Secondary | ICD-10-CM | POA: Diagnosis not present

## 2013-09-28 DIAGNOSIS — R300 Dysuria: Secondary | ICD-10-CM | POA: Diagnosis not present

## 2013-09-28 DIAGNOSIS — R3915 Urgency of urination: Secondary | ICD-10-CM | POA: Diagnosis not present

## 2013-09-28 DIAGNOSIS — R5381 Other malaise: Secondary | ICD-10-CM | POA: Diagnosis not present

## 2013-09-29 NOTE — Assessment & Plan Note (Signed)
Controlled. Continues oxygen 2 L/Advanced for sleep

## 2013-09-29 NOTE — Assessment & Plan Note (Signed)
CPAP pressure uncomfortable. Plan-reduced AutoPap range 5-12 cwp. Check ONOX on O2 as a way to assess apnea incidence

## 2013-10-01 ENCOUNTER — Encounter: Payer: Self-pay | Admitting: Radiation Oncology

## 2013-10-01 ENCOUNTER — Ambulatory Visit
Admission: RE | Admit: 2013-10-01 | Discharge: 2013-10-01 | Disposition: A | Discharge status: Home / Self Care | Payer: Medicare Other | Source: Ambulatory Visit | Attending: Radiation Oncology | Admitting: Radiation Oncology

## 2013-10-01 VITALS — BP 137/82 | HR 80 | Temp 97.9°F | Resp 20 | Wt 200.5 lb

## 2013-10-01 DIAGNOSIS — Z510 Encounter for antineoplastic radiation therapy: Secondary | ICD-10-CM | POA: Diagnosis not present

## 2013-10-01 DIAGNOSIS — C61 Malignant neoplasm of prostate: Secondary | ICD-10-CM | POA: Diagnosis not present

## 2013-10-01 DIAGNOSIS — R3915 Urgency of urination: Secondary | ICD-10-CM | POA: Diagnosis not present

## 2013-10-01 DIAGNOSIS — R5383 Other fatigue: Secondary | ICD-10-CM | POA: Diagnosis not present

## 2013-10-01 DIAGNOSIS — R300 Dysuria: Secondary | ICD-10-CM | POA: Diagnosis not present

## 2013-10-01 DIAGNOSIS — R5381 Other malaise: Secondary | ICD-10-CM | POA: Diagnosis not present

## 2013-10-01 DIAGNOSIS — N342 Other urethritis: Secondary | ICD-10-CM | POA: Diagnosis not present

## 2013-10-01 NOTE — Progress Notes (Signed)
Pt states he reviewed "Radiation and You" booklet and has no questions. Pt denies pain, urinary/bowel issues, fatigue, loss of appetite today.

## 2013-10-01 NOTE — Progress Notes (Signed)
Weekly Management Note:  Site: Prostate Current Dose:  1560  cGy Projected Dose: 7800  cGy  Narrative: The patient is seen today for routine under treatment assessment. CBCT/MVCT images/port films were reviewed. The chart was reviewed.   Bladder filling today is excellent. He tells me he was up 4-5 times last night urinating. His baseline is 1-2. His pretreatment I PSS score was 14. No GI difficulties.  Physical Examination:  Filed Vitals:   10/01/13 1007  BP: 137/82  Pulse: 80  Temp: 97.9 F (36.6 C)  Resp: 20  .  Weight: 200 lb 8 oz (90.946 kg). No change.  Impression: Tolerating radiation therapy well. He has significant bladder filling today and I have some concern that he may not be emptying his bladder. He may be a candidate for an alpha blocker. He wants to hold off for now.  Plan: Continue radiation therapy as planned.

## 2013-10-02 ENCOUNTER — Ambulatory Visit
Admission: RE | Admit: 2013-10-02 | Discharge: 2013-10-02 | Disposition: A | Discharge status: Home / Self Care | Payer: Medicare Other | Source: Ambulatory Visit | Attending: Radiation Oncology | Admitting: Radiation Oncology

## 2013-10-02 DIAGNOSIS — N342 Other urethritis: Secondary | ICD-10-CM | POA: Diagnosis not present

## 2013-10-02 DIAGNOSIS — R300 Dysuria: Secondary | ICD-10-CM | POA: Diagnosis not present

## 2013-10-02 DIAGNOSIS — Z510 Encounter for antineoplastic radiation therapy: Secondary | ICD-10-CM | POA: Diagnosis not present

## 2013-10-02 DIAGNOSIS — R5383 Other fatigue: Secondary | ICD-10-CM | POA: Diagnosis not present

## 2013-10-02 DIAGNOSIS — C61 Malignant neoplasm of prostate: Secondary | ICD-10-CM | POA: Diagnosis not present

## 2013-10-02 DIAGNOSIS — R5381 Other malaise: Secondary | ICD-10-CM | POA: Diagnosis not present

## 2013-10-02 DIAGNOSIS — R3915 Urgency of urination: Secondary | ICD-10-CM | POA: Diagnosis not present

## 2013-10-02 NOTE — Discharge Summary (Signed)
This is a discharge summary relating to an admission on Christmas Eve, 08/15/13, discharged on 08/16/13 after being admitted for partial small bowel obstruction. Original dictation has been lost.this discharge summary is being reconstructed from memory and hospital records. Date admitted August 15, 2013 Date discharged August 16, 2013 Admitting physician Dr. Burnard Bunting Discharging physician Dr. Berneta Sages  Discharge diagnosis nausea and vomiting refractory Type 2 diabetes CAD Hypertension  History of present illness--------------Daniel Singleton is a 73 year old gentleman with a history of diabetes mellitus type 2, coronary artery disease, hypertension, COPD presenting at this time with intractable nausea vomiting and diarrhea. He evidently presented to the office yesterday with similar symptoms was given some Zofran does continue with progressive nausea vomiting and intolerant of all intake. He presented to the emergency room where he is felt to be dry tachycardic and even emergency room is had continued nausea and vomiting albeit it is settled down now after about 5-6 hours and some fluid boluses. While he does relate some generalized abnormal soreness from vomiting as no frank pain. He's had no documented fever. He has had some hot flashes largely related to his hormone injection for his prostate cancer and is due to undergo radiation therapy in the upcoming weeks. He's had no cough congestion and no rash. Denies any headache or neck stiffness. Has no generalized muscle soreness. He has had some accompanying loose diarrheal stools 3-5 daily at least 2 in the near.  Hospital course-- Patient was admitted, given antibiotics, IV fluids, and KUB revealed small bowel obstruction EXAM:  ACUTE ABDOMEN SERIES (ABDOMEN 2 VIEW & CHEST 1 VIEW)  COMPARISON: Chest radiograph 11/23/2012.  FINDINGS:  Median sternotomy/ CABG. Prominent left-sided pericardial fat pad.  Scattered areas of atelectasis and and  scarring. No free air  underneath the hemidiaphragms. Dilated loops of small bowel are  present, with a paucity of rectal gas and stool. The appearance is  compatible with small bowel obstruction. Mural thickening of dilated  small bowel loops. Maximal diameter of small bowel is 4.6 cm. Fluid  levels are present on the upright view. There is no plain film  evidence of free air. Ventral hernia tacks are present in the  central abdomen.  IMPRESSION:  Findings consistent with small bowel obstruction. Enteritis is  considered less likely. Postoperative changes of ventral hernia  repair.  Electronically Signed  By: Dereck Ligas M.D.  On: 08/15/2013 20:56   CBC revealed a white blood cell count 5.7, hemoglobin 12.0, hematocrit 34.2 with platelet count of 131. Basic metabolic panel revealed normal renal parameters with a potassium of 3.4. The above mentioned labs were obtained on the day of discharge, patient on the day of discharge was drinking clear fluids, was given a full lunch, tolerated that well without nausea vomiting or emesis, did have a bowel movement, was passing flatus and given clinical resolution of his small bowel obstruction and is anxious list for discharge, patient was discharged on her medication regimen with instructions to return back to the emergency room if he developed significant nausea vomiting or abdominal pain. Patient was in agreement, and was encouraged to call back our office within the next one week for close followup and monitoring.

## 2013-10-03 ENCOUNTER — Ambulatory Visit
Admission: RE | Admit: 2013-10-03 | Discharge: 2013-10-03 | Disposition: A | Discharge status: Home / Self Care | Payer: Medicare Other | Source: Ambulatory Visit | Attending: Radiation Oncology | Admitting: Radiation Oncology

## 2013-10-03 DIAGNOSIS — Z510 Encounter for antineoplastic radiation therapy: Secondary | ICD-10-CM | POA: Diagnosis not present

## 2013-10-03 DIAGNOSIS — C61 Malignant neoplasm of prostate: Secondary | ICD-10-CM | POA: Diagnosis not present

## 2013-10-03 DIAGNOSIS — R5381 Other malaise: Secondary | ICD-10-CM | POA: Diagnosis not present

## 2013-10-03 DIAGNOSIS — R300 Dysuria: Secondary | ICD-10-CM | POA: Diagnosis not present

## 2013-10-03 DIAGNOSIS — R3915 Urgency of urination: Secondary | ICD-10-CM | POA: Diagnosis not present

## 2013-10-03 DIAGNOSIS — N342 Other urethritis: Secondary | ICD-10-CM | POA: Diagnosis not present

## 2013-10-04 ENCOUNTER — Ambulatory Visit
Admission: RE | Admit: 2013-10-04 | Discharge: 2013-10-04 | Disposition: A | Discharge status: Home / Self Care | Payer: Medicare Other | Source: Ambulatory Visit | Attending: Radiation Oncology | Admitting: Radiation Oncology

## 2013-10-04 DIAGNOSIS — C61 Malignant neoplasm of prostate: Secondary | ICD-10-CM | POA: Diagnosis not present

## 2013-10-04 DIAGNOSIS — R300 Dysuria: Secondary | ICD-10-CM | POA: Diagnosis not present

## 2013-10-04 DIAGNOSIS — R3915 Urgency of urination: Secondary | ICD-10-CM | POA: Diagnosis not present

## 2013-10-04 DIAGNOSIS — R5381 Other malaise: Secondary | ICD-10-CM | POA: Diagnosis not present

## 2013-10-04 DIAGNOSIS — Z510 Encounter for antineoplastic radiation therapy: Secondary | ICD-10-CM | POA: Diagnosis not present

## 2013-10-04 DIAGNOSIS — N342 Other urethritis: Secondary | ICD-10-CM | POA: Diagnosis not present

## 2013-10-05 ENCOUNTER — Ambulatory Visit
Admission: RE | Admit: 2013-10-05 | Discharge: 2013-10-05 | Disposition: A | Discharge status: Home / Self Care | Payer: Medicare Other | Source: Ambulatory Visit | Attending: Radiation Oncology | Admitting: Radiation Oncology

## 2013-10-05 DIAGNOSIS — Z6828 Body mass index (BMI) 28.0-28.9, adult: Secondary | ICD-10-CM | POA: Diagnosis not present

## 2013-10-05 DIAGNOSIS — C61 Malignant neoplasm of prostate: Secondary | ICD-10-CM | POA: Diagnosis not present

## 2013-10-05 DIAGNOSIS — E1169 Type 2 diabetes mellitus with other specified complication: Secondary | ICD-10-CM | POA: Diagnosis not present

## 2013-10-05 DIAGNOSIS — E785 Hyperlipidemia, unspecified: Secondary | ICD-10-CM | POA: Diagnosis not present

## 2013-10-05 DIAGNOSIS — G4733 Obstructive sleep apnea (adult) (pediatric): Secondary | ICD-10-CM | POA: Diagnosis not present

## 2013-10-05 DIAGNOSIS — R3915 Urgency of urination: Secondary | ICD-10-CM | POA: Diagnosis not present

## 2013-10-05 DIAGNOSIS — J449 Chronic obstructive pulmonary disease, unspecified: Secondary | ICD-10-CM | POA: Diagnosis not present

## 2013-10-05 DIAGNOSIS — F411 Generalized anxiety disorder: Secondary | ICD-10-CM | POA: Diagnosis not present

## 2013-10-05 DIAGNOSIS — R5383 Other fatigue: Secondary | ICD-10-CM | POA: Diagnosis not present

## 2013-10-05 DIAGNOSIS — N342 Other urethritis: Secondary | ICD-10-CM | POA: Diagnosis not present

## 2013-10-05 DIAGNOSIS — Z510 Encounter for antineoplastic radiation therapy: Secondary | ICD-10-CM | POA: Diagnosis not present

## 2013-10-05 DIAGNOSIS — R300 Dysuria: Secondary | ICD-10-CM | POA: Diagnosis not present

## 2013-10-05 DIAGNOSIS — R5381 Other malaise: Secondary | ICD-10-CM | POA: Diagnosis not present

## 2013-10-08 ENCOUNTER — Ambulatory Visit
Admission: RE | Admit: 2013-10-08 | Discharge: 2013-10-08 | Disposition: A | Discharge status: Home / Self Care | Payer: Medicare Other | Source: Ambulatory Visit | Attending: Radiation Oncology | Admitting: Radiation Oncology

## 2013-10-08 ENCOUNTER — Encounter: Payer: Self-pay | Admitting: Radiation Oncology

## 2013-10-08 VITALS — BP 156/88 | HR 86 | Temp 97.4°F | Resp 20 | Wt 200.0 lb

## 2013-10-08 DIAGNOSIS — R300 Dysuria: Secondary | ICD-10-CM | POA: Diagnosis not present

## 2013-10-08 DIAGNOSIS — Z510 Encounter for antineoplastic radiation therapy: Secondary | ICD-10-CM | POA: Diagnosis not present

## 2013-10-08 DIAGNOSIS — R3915 Urgency of urination: Secondary | ICD-10-CM | POA: Diagnosis not present

## 2013-10-08 DIAGNOSIS — R5381 Other malaise: Secondary | ICD-10-CM | POA: Diagnosis not present

## 2013-10-08 DIAGNOSIS — N342 Other urethritis: Secondary | ICD-10-CM | POA: Diagnosis not present

## 2013-10-08 DIAGNOSIS — C61 Malignant neoplasm of prostate: Secondary | ICD-10-CM | POA: Diagnosis not present

## 2013-10-08 NOTE — Progress Notes (Signed)
Pt denies pain, bladder/bowel issues, fatigue, loss of appetite.

## 2013-10-08 NOTE — Progress Notes (Signed)
Weekly Management Note:  Site: Prostate Current Dose:  2535  cGy Projected Dose: 7800  cGy  Narrative: The patient is seen today for routine under treatment assessment. CBCT/MVCT images/port films were reviewed. The chart was reviewed.   Bladder filling is satisfactory. No new GU or GI difficulties except for slight dysuria.  Physical Examination:  Filed Vitals:   10/08/13 1019  BP: 156/88  Pulse: 86  Temp: 97.4 F (36.3 C)  Resp: 20  .  Weight: 200 lb (90.719 kg). No change.  Impression: Tolerating radiation therapy well.  Plan: Continue radiation therapy as planned.

## 2013-10-09 ENCOUNTER — Ambulatory Visit
Admission: RE | Admit: 2013-10-09 | Discharge: 2013-10-09 | Disposition: A | Discharge status: Home / Self Care | Payer: Medicare Other | Source: Ambulatory Visit | Attending: Radiation Oncology | Admitting: Radiation Oncology

## 2013-10-09 DIAGNOSIS — R5383 Other fatigue: Secondary | ICD-10-CM | POA: Diagnosis not present

## 2013-10-09 DIAGNOSIS — R5381 Other malaise: Secondary | ICD-10-CM | POA: Diagnosis not present

## 2013-10-09 DIAGNOSIS — C61 Malignant neoplasm of prostate: Secondary | ICD-10-CM | POA: Diagnosis not present

## 2013-10-09 DIAGNOSIS — R3915 Urgency of urination: Secondary | ICD-10-CM | POA: Diagnosis not present

## 2013-10-09 DIAGNOSIS — N342 Other urethritis: Secondary | ICD-10-CM | POA: Diagnosis not present

## 2013-10-09 DIAGNOSIS — Z510 Encounter for antineoplastic radiation therapy: Secondary | ICD-10-CM | POA: Diagnosis not present

## 2013-10-09 DIAGNOSIS — R300 Dysuria: Secondary | ICD-10-CM | POA: Diagnosis not present

## 2013-10-10 ENCOUNTER — Ambulatory Visit
Admission: RE | Admit: 2013-10-10 | Discharge: 2013-10-10 | Disposition: A | Discharge status: Home / Self Care | Payer: Medicare Other | Source: Ambulatory Visit | Attending: Radiation Oncology | Admitting: Radiation Oncology

## 2013-10-10 DIAGNOSIS — R5381 Other malaise: Secondary | ICD-10-CM | POA: Diagnosis not present

## 2013-10-10 DIAGNOSIS — N342 Other urethritis: Secondary | ICD-10-CM | POA: Diagnosis not present

## 2013-10-10 DIAGNOSIS — R300 Dysuria: Secondary | ICD-10-CM | POA: Diagnosis not present

## 2013-10-10 DIAGNOSIS — C61 Malignant neoplasm of prostate: Secondary | ICD-10-CM | POA: Diagnosis not present

## 2013-10-10 DIAGNOSIS — R5383 Other fatigue: Secondary | ICD-10-CM | POA: Diagnosis not present

## 2013-10-10 DIAGNOSIS — Z510 Encounter for antineoplastic radiation therapy: Secondary | ICD-10-CM | POA: Diagnosis not present

## 2013-10-10 DIAGNOSIS — R3915 Urgency of urination: Secondary | ICD-10-CM | POA: Diagnosis not present

## 2013-10-11 ENCOUNTER — Ambulatory Visit
Admission: RE | Admit: 2013-10-11 | Discharge: 2013-10-11 | Disposition: A | Discharge status: Home / Self Care | Payer: Medicare Other | Source: Ambulatory Visit | Attending: Radiation Oncology | Admitting: Radiation Oncology

## 2013-10-11 DIAGNOSIS — R5383 Other fatigue: Secondary | ICD-10-CM | POA: Diagnosis not present

## 2013-10-11 DIAGNOSIS — R3915 Urgency of urination: Secondary | ICD-10-CM | POA: Diagnosis not present

## 2013-10-11 DIAGNOSIS — Z510 Encounter for antineoplastic radiation therapy: Secondary | ICD-10-CM | POA: Diagnosis not present

## 2013-10-11 DIAGNOSIS — C61 Malignant neoplasm of prostate: Secondary | ICD-10-CM | POA: Diagnosis not present

## 2013-10-11 DIAGNOSIS — R300 Dysuria: Secondary | ICD-10-CM | POA: Diagnosis not present

## 2013-10-11 DIAGNOSIS — R5381 Other malaise: Secondary | ICD-10-CM | POA: Diagnosis not present

## 2013-10-11 DIAGNOSIS — N342 Other urethritis: Secondary | ICD-10-CM | POA: Diagnosis not present

## 2013-10-12 ENCOUNTER — Ambulatory Visit
Admission: RE | Admit: 2013-10-12 | Discharge: 2013-10-12 | Disposition: A | Discharge status: Home / Self Care | Payer: Medicare Other | Source: Ambulatory Visit | Attending: Radiation Oncology | Admitting: Radiation Oncology

## 2013-10-12 DIAGNOSIS — R300 Dysuria: Secondary | ICD-10-CM | POA: Diagnosis not present

## 2013-10-12 DIAGNOSIS — Z510 Encounter for antineoplastic radiation therapy: Secondary | ICD-10-CM | POA: Diagnosis not present

## 2013-10-12 DIAGNOSIS — N342 Other urethritis: Secondary | ICD-10-CM | POA: Diagnosis not present

## 2013-10-12 DIAGNOSIS — C61 Malignant neoplasm of prostate: Secondary | ICD-10-CM | POA: Diagnosis not present

## 2013-10-12 DIAGNOSIS — R5381 Other malaise: Secondary | ICD-10-CM | POA: Diagnosis not present

## 2013-10-12 DIAGNOSIS — R3915 Urgency of urination: Secondary | ICD-10-CM | POA: Diagnosis not present

## 2013-10-15 ENCOUNTER — Ambulatory Visit
Admission: RE | Admit: 2013-10-15 | Discharge: 2013-10-15 | Disposition: A | Discharge status: Home / Self Care | Payer: Medicare Other | Source: Ambulatory Visit | Attending: Radiation Oncology | Admitting: Radiation Oncology

## 2013-10-15 ENCOUNTER — Encounter: Payer: Self-pay | Admitting: Radiation Oncology

## 2013-10-15 VITALS — BP 153/87 | HR 88 | Temp 97.5°F | Resp 20 | Wt 199.7 lb

## 2013-10-15 DIAGNOSIS — N342 Other urethritis: Secondary | ICD-10-CM | POA: Diagnosis not present

## 2013-10-15 DIAGNOSIS — Z510 Encounter for antineoplastic radiation therapy: Secondary | ICD-10-CM | POA: Diagnosis not present

## 2013-10-15 DIAGNOSIS — R5381 Other malaise: Secondary | ICD-10-CM | POA: Diagnosis not present

## 2013-10-15 DIAGNOSIS — C61 Malignant neoplasm of prostate: Secondary | ICD-10-CM | POA: Diagnosis not present

## 2013-10-15 DIAGNOSIS — R3915 Urgency of urination: Secondary | ICD-10-CM | POA: Diagnosis not present

## 2013-10-15 DIAGNOSIS — R300 Dysuria: Secondary | ICD-10-CM | POA: Diagnosis not present

## 2013-10-15 NOTE — Addendum Note (Signed)
Encounter addended by: Andria Rhein, RN on: 10/15/2013 11:16 AM<BR>     Documentation filed: Inpatient Document Flowsheet

## 2013-10-15 NOTE — Progress Notes (Addendum)
Pt denies pain, loss of appetite, fatigue. He reports nocturia x 3, urinary frequency and urinary urgency. Pt also states he continues to have dysuria, and "it maybe slightly worse". He states he fell on the ice at his home last week, and his left hand hurts. He caught his fall with his left hand. He states he "is sore and stiff".

## 2013-10-15 NOTE — Progress Notes (Signed)
Weekly Management Note:  Site: Prostate Current Dose:  3510  cGy Projected Dose: 7800  cGy  Narrative: The patient is seen today for routine under treatment assessment. CBCT/MVCT images/port films were reviewed. The chart was reviewed.   Bladder filling is satisfactory. He does have slightly more nocturia along with urinary frequency and urgency.  Physical Examination:  Filed Vitals:   10/15/13 1040  BP: 153/87  Pulse: 88  Temp: 97.5 F (36.4 C)  Resp: 20  .  Weight: 199 lb 11.2 oz (90.583 kg). No change.  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 If this worsens over the next week we may obtain a urinalysis/culture.  Plan: Continue radiation therapy as planned.

## 2013-10-16 ENCOUNTER — Ambulatory Visit
Admission: RE | Admit: 2013-10-16 | Discharge: 2013-10-16 | Disposition: A | Discharge status: Home / Self Care | Payer: Medicare Other | Source: Ambulatory Visit | Attending: Radiation Oncology | Admitting: Radiation Oncology

## 2013-10-16 ENCOUNTER — Ambulatory Visit: Payer: Medicare Other | Admitting: Internal Medicine

## 2013-10-16 ENCOUNTER — Telehealth: Payer: Self-pay | Admitting: Internal Medicine

## 2013-10-16 DIAGNOSIS — G4733 Obstructive sleep apnea (adult) (pediatric): Secondary | ICD-10-CM

## 2013-10-16 DIAGNOSIS — N342 Other urethritis: Secondary | ICD-10-CM | POA: Diagnosis not present

## 2013-10-16 DIAGNOSIS — R5381 Other malaise: Secondary | ICD-10-CM | POA: Diagnosis not present

## 2013-10-16 DIAGNOSIS — R300 Dysuria: Secondary | ICD-10-CM | POA: Diagnosis not present

## 2013-10-16 DIAGNOSIS — R5383 Other fatigue: Secondary | ICD-10-CM | POA: Diagnosis not present

## 2013-10-16 DIAGNOSIS — C61 Malignant neoplasm of prostate: Secondary | ICD-10-CM | POA: Diagnosis not present

## 2013-10-16 DIAGNOSIS — Z510 Encounter for antineoplastic radiation therapy: Secondary | ICD-10-CM | POA: Diagnosis not present

## 2013-10-16 DIAGNOSIS — R3915 Urgency of urination: Secondary | ICD-10-CM | POA: Diagnosis not present

## 2013-10-16 NOTE — Telephone Encounter (Signed)
Spoke with patient and wife-they are aware that we are placing order to set CPAP at fixed pressure of 10 with [facility]; if any troubles with pressure setting then please call our office.

## 2013-10-16 NOTE — Telephone Encounter (Signed)
Order- DME set CPAP fixed 10    Dx OSA

## 2013-10-16 NOTE — Telephone Encounter (Signed)
Obstructive sleep apnea, hx of - Deneise Lever, MD at 09/29/2013 8:17 PM     Status: Written Related Problem: Obstructive sleep apnea, hx of    CPAP pressure uncomfortable.  Plan-reduced AutoPap range 5-12 cwp. Check ONOX on O2 as a way to assess apnea incidence    CY, please advise. Thanks.

## 2013-10-17 ENCOUNTER — Ambulatory Visit
Admission: RE | Admit: 2013-10-17 | Discharge: 2013-10-17 | Disposition: A | Discharge status: Home / Self Care | Payer: Medicare Other | Source: Ambulatory Visit | Attending: Radiation Oncology | Admitting: Radiation Oncology

## 2013-10-17 DIAGNOSIS — R300 Dysuria: Secondary | ICD-10-CM | POA: Diagnosis not present

## 2013-10-17 DIAGNOSIS — N342 Other urethritis: Secondary | ICD-10-CM | POA: Diagnosis not present

## 2013-10-17 DIAGNOSIS — Z510 Encounter for antineoplastic radiation therapy: Secondary | ICD-10-CM | POA: Diagnosis not present

## 2013-10-17 DIAGNOSIS — R5381 Other malaise: Secondary | ICD-10-CM | POA: Diagnosis not present

## 2013-10-17 DIAGNOSIS — R5383 Other fatigue: Secondary | ICD-10-CM | POA: Diagnosis not present

## 2013-10-17 DIAGNOSIS — R3915 Urgency of urination: Secondary | ICD-10-CM | POA: Diagnosis not present

## 2013-10-17 DIAGNOSIS — C61 Malignant neoplasm of prostate: Secondary | ICD-10-CM | POA: Diagnosis not present

## 2013-10-18 ENCOUNTER — Ambulatory Visit
Admission: RE | Admit: 2013-10-18 | Discharge: 2013-10-18 | Disposition: A | Discharge status: Home / Self Care | Payer: Medicare Other | Source: Ambulatory Visit | Attending: Radiation Oncology | Admitting: Radiation Oncology

## 2013-10-18 DIAGNOSIS — R3915 Urgency of urination: Secondary | ICD-10-CM | POA: Diagnosis not present

## 2013-10-18 DIAGNOSIS — R5383 Other fatigue: Secondary | ICD-10-CM | POA: Diagnosis not present

## 2013-10-18 DIAGNOSIS — R300 Dysuria: Secondary | ICD-10-CM | POA: Diagnosis not present

## 2013-10-18 DIAGNOSIS — N342 Other urethritis: Secondary | ICD-10-CM | POA: Diagnosis not present

## 2013-10-18 DIAGNOSIS — R5381 Other malaise: Secondary | ICD-10-CM | POA: Diagnosis not present

## 2013-10-18 DIAGNOSIS — C61 Malignant neoplasm of prostate: Secondary | ICD-10-CM | POA: Diagnosis not present

## 2013-10-18 DIAGNOSIS — Z510 Encounter for antineoplastic radiation therapy: Secondary | ICD-10-CM | POA: Diagnosis not present

## 2013-10-19 ENCOUNTER — Ambulatory Visit
Admission: RE | Admit: 2013-10-19 | Discharge: 2013-10-19 | Disposition: A | Discharge status: Home / Self Care | Payer: Medicare Other | Source: Ambulatory Visit | Attending: Radiation Oncology | Admitting: Radiation Oncology

## 2013-10-19 DIAGNOSIS — R3915 Urgency of urination: Secondary | ICD-10-CM | POA: Diagnosis not present

## 2013-10-19 DIAGNOSIS — R5381 Other malaise: Secondary | ICD-10-CM | POA: Diagnosis not present

## 2013-10-19 DIAGNOSIS — R300 Dysuria: Secondary | ICD-10-CM | POA: Diagnosis not present

## 2013-10-19 DIAGNOSIS — Z510 Encounter for antineoplastic radiation therapy: Secondary | ICD-10-CM | POA: Diagnosis not present

## 2013-10-19 DIAGNOSIS — C61 Malignant neoplasm of prostate: Secondary | ICD-10-CM | POA: Diagnosis not present

## 2013-10-19 DIAGNOSIS — N342 Other urethritis: Secondary | ICD-10-CM | POA: Diagnosis not present

## 2013-10-22 ENCOUNTER — Ambulatory Visit
Admission: RE | Admit: 2013-10-22 | Discharge: 2013-10-22 | Disposition: A | Discharge status: Home / Self Care | Payer: Medicare Other | Source: Ambulatory Visit | Attending: Radiation Oncology | Admitting: Radiation Oncology

## 2013-10-22 ENCOUNTER — Encounter: Payer: Self-pay | Admitting: Radiation Oncology

## 2013-10-22 VITALS — BP 136/80 | HR 97 | Temp 98.0°F | Resp 20 | Wt 198.9 lb

## 2013-10-22 DIAGNOSIS — R5381 Other malaise: Secondary | ICD-10-CM | POA: Diagnosis not present

## 2013-10-22 DIAGNOSIS — N342 Other urethritis: Secondary | ICD-10-CM | POA: Diagnosis not present

## 2013-10-22 DIAGNOSIS — Z510 Encounter for antineoplastic radiation therapy: Secondary | ICD-10-CM | POA: Diagnosis not present

## 2013-10-22 DIAGNOSIS — R300 Dysuria: Secondary | ICD-10-CM | POA: Diagnosis not present

## 2013-10-22 DIAGNOSIS — C61 Malignant neoplasm of prostate: Secondary | ICD-10-CM | POA: Diagnosis not present

## 2013-10-22 DIAGNOSIS — R3915 Urgency of urination: Secondary | ICD-10-CM | POA: Diagnosis not present

## 2013-10-22 NOTE — Progress Notes (Signed)
Pt denies pain, urinary/bowel issues. He states he is fatigued.

## 2013-10-22 NOTE — Progress Notes (Signed)
Weekly Management Note:  Site: Prostate Current Dose:  4485  cGy Projected Dose: 7800  cGy  Narrative: The patient is seen today for routine under treatment assessment. CBCT/MVCT images/port films were reviewed. The chart was reviewed.   Bladder filling is satisfactory. He does have some urinary urgency which is new. He also has mild fatigue. No GI difficulty.  Physical Examination:  Filed Vitals:   10/22/13 0956  BP: 136/80  Pulse: 97  Temp: 98 F (36.7 C)  Resp: 20  .  Weight: 198 lb 14.4 oz (90.22 kg). No change.  Impression: Tolerating radiation therapy well.  Plan: Continue radiation therapy as planned.

## 2013-10-23 ENCOUNTER — Ambulatory Visit
Admission: RE | Admit: 2013-10-23 | Discharge: 2013-10-23 | Disposition: A | Discharge status: Home / Self Care | Payer: Medicare Other | Source: Ambulatory Visit | Attending: Radiation Oncology | Admitting: Radiation Oncology

## 2013-10-23 DIAGNOSIS — C61 Malignant neoplasm of prostate: Secondary | ICD-10-CM | POA: Diagnosis not present

## 2013-10-23 DIAGNOSIS — N342 Other urethritis: Secondary | ICD-10-CM | POA: Diagnosis not present

## 2013-10-23 DIAGNOSIS — Z510 Encounter for antineoplastic radiation therapy: Secondary | ICD-10-CM | POA: Diagnosis not present

## 2013-10-23 DIAGNOSIS — R3915 Urgency of urination: Secondary | ICD-10-CM | POA: Diagnosis not present

## 2013-10-23 DIAGNOSIS — R5381 Other malaise: Secondary | ICD-10-CM | POA: Diagnosis not present

## 2013-10-23 DIAGNOSIS — R300 Dysuria: Secondary | ICD-10-CM | POA: Diagnosis not present

## 2013-10-23 DIAGNOSIS — R5383 Other fatigue: Secondary | ICD-10-CM | POA: Diagnosis not present

## 2013-10-24 ENCOUNTER — Ambulatory Visit
Admission: RE | Admit: 2013-10-24 | Discharge: 2013-10-24 | Disposition: A | Discharge status: Home / Self Care | Payer: Medicare Other | Source: Ambulatory Visit | Attending: Radiation Oncology | Admitting: Radiation Oncology

## 2013-10-24 DIAGNOSIS — R5381 Other malaise: Secondary | ICD-10-CM | POA: Diagnosis not present

## 2013-10-24 DIAGNOSIS — C61 Malignant neoplasm of prostate: Secondary | ICD-10-CM | POA: Diagnosis not present

## 2013-10-24 DIAGNOSIS — R300 Dysuria: Secondary | ICD-10-CM | POA: Diagnosis not present

## 2013-10-24 DIAGNOSIS — N342 Other urethritis: Secondary | ICD-10-CM | POA: Diagnosis not present

## 2013-10-24 DIAGNOSIS — R3915 Urgency of urination: Secondary | ICD-10-CM | POA: Diagnosis not present

## 2013-10-24 DIAGNOSIS — Z510 Encounter for antineoplastic radiation therapy: Secondary | ICD-10-CM | POA: Diagnosis not present

## 2013-10-25 ENCOUNTER — Ambulatory Visit
Admission: RE | Admit: 2013-10-25 | Discharge: 2013-10-25 | Disposition: A | Discharge status: Home / Self Care | Payer: Medicare Other | Source: Ambulatory Visit | Attending: Radiation Oncology | Admitting: Radiation Oncology

## 2013-10-25 DIAGNOSIS — N342 Other urethritis: Secondary | ICD-10-CM | POA: Diagnosis not present

## 2013-10-25 DIAGNOSIS — Z510 Encounter for antineoplastic radiation therapy: Secondary | ICD-10-CM | POA: Diagnosis not present

## 2013-10-25 DIAGNOSIS — R3915 Urgency of urination: Secondary | ICD-10-CM | POA: Diagnosis not present

## 2013-10-25 DIAGNOSIS — C61 Malignant neoplasm of prostate: Secondary | ICD-10-CM | POA: Diagnosis not present

## 2013-10-25 DIAGNOSIS — R5383 Other fatigue: Secondary | ICD-10-CM | POA: Diagnosis not present

## 2013-10-25 DIAGNOSIS — R5381 Other malaise: Secondary | ICD-10-CM | POA: Diagnosis not present

## 2013-10-25 DIAGNOSIS — R300 Dysuria: Secondary | ICD-10-CM | POA: Diagnosis not present

## 2013-10-26 ENCOUNTER — Ambulatory Visit
Admission: RE | Admit: 2013-10-26 | Discharge: 2013-10-26 | Disposition: A | Discharge status: Home / Self Care | Payer: Medicare Other | Source: Ambulatory Visit | Attending: Radiation Oncology | Admitting: Radiation Oncology

## 2013-10-26 DIAGNOSIS — Z510 Encounter for antineoplastic radiation therapy: Secondary | ICD-10-CM | POA: Diagnosis not present

## 2013-10-26 DIAGNOSIS — R3915 Urgency of urination: Secondary | ICD-10-CM | POA: Diagnosis not present

## 2013-10-26 DIAGNOSIS — N342 Other urethritis: Secondary | ICD-10-CM | POA: Diagnosis not present

## 2013-10-26 DIAGNOSIS — C61 Malignant neoplasm of prostate: Secondary | ICD-10-CM | POA: Diagnosis not present

## 2013-10-26 DIAGNOSIS — R5381 Other malaise: Secondary | ICD-10-CM | POA: Diagnosis not present

## 2013-10-26 DIAGNOSIS — R300 Dysuria: Secondary | ICD-10-CM | POA: Diagnosis not present

## 2013-10-29 ENCOUNTER — Ambulatory Visit
Admission: RE | Admit: 2013-10-29 | Discharge: 2013-10-29 | Disposition: A | Discharge status: Home / Self Care | Payer: Medicare Other | Source: Ambulatory Visit | Attending: Radiation Oncology | Admitting: Radiation Oncology

## 2013-10-29 ENCOUNTER — Encounter: Payer: Self-pay | Admitting: Radiation Oncology

## 2013-10-29 VITALS — BP 129/83 | HR 73 | Temp 97.6°F | Resp 20 | Wt 200.4 lb

## 2013-10-29 DIAGNOSIS — C61 Malignant neoplasm of prostate: Secondary | ICD-10-CM | POA: Diagnosis not present

## 2013-10-29 DIAGNOSIS — R5381 Other malaise: Secondary | ICD-10-CM | POA: Diagnosis not present

## 2013-10-29 DIAGNOSIS — N342 Other urethritis: Secondary | ICD-10-CM | POA: Diagnosis not present

## 2013-10-29 DIAGNOSIS — R5383 Other fatigue: Secondary | ICD-10-CM | POA: Diagnosis not present

## 2013-10-29 DIAGNOSIS — R3915 Urgency of urination: Secondary | ICD-10-CM | POA: Diagnosis not present

## 2013-10-29 DIAGNOSIS — Z510 Encounter for antineoplastic radiation therapy: Secondary | ICD-10-CM | POA: Diagnosis not present

## 2013-10-29 DIAGNOSIS — R300 Dysuria: Secondary | ICD-10-CM | POA: Diagnosis not present

## 2013-10-29 NOTE — Progress Notes (Signed)
Pt denies pain, fatigue, loss of appetite, bowel issues. He continues to have slight urinary urgency.Marland Kitchen

## 2013-10-29 NOTE — Progress Notes (Signed)
Weekly Management Note:  Site: Prostate Current Dose:  5460  cGy Projected Dose: 7800  cGy  Narrative: The patient is seen today for routine under treatment assessment. CBCT/MVCT images/port films were reviewed. The chart was reviewed.   Bladder filling is satisfactory. He does have mild dysuria and urgency.  Physical Examination:  Filed Vitals:   10/29/13 1010  BP: 129/83  Pulse: 73  Temp: 97.6 F (36.4 C)  Resp: 20  .  Weight: 200 lb 6.4 oz (90.901 kg). No change.  Impression: Tolerating radiation therapy well.  Plan: Continue radiation therapy as planned.

## 2013-10-30 ENCOUNTER — Ambulatory Visit
Admission: RE | Admit: 2013-10-30 | Discharge: 2013-10-30 | Disposition: A | Discharge status: Home / Self Care | Payer: Medicare Other | Source: Ambulatory Visit | Attending: Radiation Oncology | Admitting: Radiation Oncology

## 2013-10-30 DIAGNOSIS — R300 Dysuria: Secondary | ICD-10-CM | POA: Diagnosis not present

## 2013-10-30 DIAGNOSIS — R5381 Other malaise: Secondary | ICD-10-CM | POA: Diagnosis not present

## 2013-10-30 DIAGNOSIS — N342 Other urethritis: Secondary | ICD-10-CM | POA: Diagnosis not present

## 2013-10-30 DIAGNOSIS — Z510 Encounter for antineoplastic radiation therapy: Secondary | ICD-10-CM | POA: Diagnosis not present

## 2013-10-30 DIAGNOSIS — R5383 Other fatigue: Secondary | ICD-10-CM | POA: Diagnosis not present

## 2013-10-30 DIAGNOSIS — R3915 Urgency of urination: Secondary | ICD-10-CM | POA: Diagnosis not present

## 2013-10-30 DIAGNOSIS — C61 Malignant neoplasm of prostate: Secondary | ICD-10-CM | POA: Diagnosis not present

## 2013-10-31 ENCOUNTER — Ambulatory Visit
Admission: RE | Admit: 2013-10-31 | Discharge: 2013-10-31 | Disposition: A | Discharge status: Home / Self Care | Payer: Medicare Other | Source: Ambulatory Visit | Attending: Radiation Oncology | Admitting: Radiation Oncology

## 2013-10-31 DIAGNOSIS — R3915 Urgency of urination: Secondary | ICD-10-CM | POA: Diagnosis not present

## 2013-10-31 DIAGNOSIS — R5383 Other fatigue: Secondary | ICD-10-CM | POA: Diagnosis not present

## 2013-10-31 DIAGNOSIS — Z510 Encounter for antineoplastic radiation therapy: Secondary | ICD-10-CM | POA: Diagnosis not present

## 2013-10-31 DIAGNOSIS — N342 Other urethritis: Secondary | ICD-10-CM | POA: Diagnosis not present

## 2013-10-31 DIAGNOSIS — C61 Malignant neoplasm of prostate: Secondary | ICD-10-CM | POA: Diagnosis not present

## 2013-10-31 DIAGNOSIS — R300 Dysuria: Secondary | ICD-10-CM | POA: Diagnosis not present

## 2013-10-31 DIAGNOSIS — R5381 Other malaise: Secondary | ICD-10-CM | POA: Diagnosis not present

## 2013-11-01 ENCOUNTER — Ambulatory Visit
Admission: RE | Admit: 2013-11-01 | Discharge: 2013-11-01 | Disposition: A | Discharge status: Home / Self Care | Payer: Medicare Other | Source: Ambulatory Visit | Attending: Radiation Oncology | Admitting: Radiation Oncology

## 2013-11-01 DIAGNOSIS — R3915 Urgency of urination: Secondary | ICD-10-CM | POA: Diagnosis not present

## 2013-11-01 DIAGNOSIS — N342 Other urethritis: Secondary | ICD-10-CM | POA: Diagnosis not present

## 2013-11-01 DIAGNOSIS — R5381 Other malaise: Secondary | ICD-10-CM | POA: Diagnosis not present

## 2013-11-01 DIAGNOSIS — C61 Malignant neoplasm of prostate: Secondary | ICD-10-CM | POA: Diagnosis not present

## 2013-11-01 DIAGNOSIS — R5383 Other fatigue: Secondary | ICD-10-CM | POA: Diagnosis not present

## 2013-11-01 DIAGNOSIS — R300 Dysuria: Secondary | ICD-10-CM | POA: Diagnosis not present

## 2013-11-01 DIAGNOSIS — Z510 Encounter for antineoplastic radiation therapy: Secondary | ICD-10-CM | POA: Diagnosis not present

## 2013-11-02 ENCOUNTER — Ambulatory Visit
Admission: RE | Admit: 2013-11-02 | Discharge: 2013-11-02 | Disposition: A | Discharge status: Home / Self Care | Payer: Medicare Other | Source: Ambulatory Visit | Attending: Radiation Oncology | Admitting: Radiation Oncology

## 2013-11-02 DIAGNOSIS — C61 Malignant neoplasm of prostate: Secondary | ICD-10-CM | POA: Diagnosis not present

## 2013-11-02 DIAGNOSIS — R300 Dysuria: Secondary | ICD-10-CM | POA: Diagnosis not present

## 2013-11-02 DIAGNOSIS — N342 Other urethritis: Secondary | ICD-10-CM | POA: Diagnosis not present

## 2013-11-02 DIAGNOSIS — Z510 Encounter for antineoplastic radiation therapy: Secondary | ICD-10-CM | POA: Diagnosis not present

## 2013-11-02 DIAGNOSIS — R5381 Other malaise: Secondary | ICD-10-CM | POA: Diagnosis not present

## 2013-11-02 DIAGNOSIS — R3915 Urgency of urination: Secondary | ICD-10-CM | POA: Diagnosis not present

## 2013-11-05 ENCOUNTER — Encounter: Payer: Self-pay | Admitting: Radiation Oncology

## 2013-11-05 ENCOUNTER — Ambulatory Visit
Admission: RE | Admit: 2013-11-05 | Discharge: 2013-11-05 | Disposition: A | Discharge status: Home / Self Care | Payer: Medicare Other | Source: Ambulatory Visit | Attending: Radiation Oncology | Admitting: Radiation Oncology

## 2013-11-05 VITALS — BP 153/82 | HR 81 | Temp 97.6°F | Resp 20 | Wt 203.6 lb

## 2013-11-05 DIAGNOSIS — N342 Other urethritis: Secondary | ICD-10-CM | POA: Diagnosis not present

## 2013-11-05 DIAGNOSIS — C61 Malignant neoplasm of prostate: Secondary | ICD-10-CM | POA: Diagnosis not present

## 2013-11-05 DIAGNOSIS — R3915 Urgency of urination: Secondary | ICD-10-CM | POA: Diagnosis not present

## 2013-11-05 DIAGNOSIS — R300 Dysuria: Secondary | ICD-10-CM | POA: Diagnosis not present

## 2013-11-05 DIAGNOSIS — R5381 Other malaise: Secondary | ICD-10-CM | POA: Diagnosis not present

## 2013-11-05 DIAGNOSIS — Z510 Encounter for antineoplastic radiation therapy: Secondary | ICD-10-CM | POA: Diagnosis not present

## 2013-11-05 NOTE — Progress Notes (Signed)
Weekly Management Note:  Site: Prostate Current Dose:  6435  cGy Projected Dose: 7800  cGy  Narrative: The patient is seen today for routine under treatment assessment. CBCT/MVCT images/port films were reviewed. The chart was reviewed.   Bladder filling is satisfactory. He does report mild dysuria with some slowing of his urinary stream. He is also having mild fatigue.  Physical Examination:  Filed Vitals:   11/05/13 1021  BP: 153/82  Pulse: 81  Temp: 97.6 F (36.4 C)  Resp: 20  .  Weight: 203 lb 9.6 oz (92.352 kg). No change.  Impression: Tolerating radiation therapy well.  Plan: Continue radiation therapy as planned.

## 2013-11-05 NOTE — Progress Notes (Signed)
Pt denies pain, loss of appetite. He states he continues to have slight dysuria, urgency, fatigue.

## 2013-11-06 ENCOUNTER — Ambulatory Visit
Admission: RE | Admit: 2013-11-06 | Discharge: 2013-11-06 | Disposition: A | Discharge status: Home / Self Care | Payer: Medicare Other | Source: Ambulatory Visit | Attending: Radiation Oncology | Admitting: Radiation Oncology

## 2013-11-06 DIAGNOSIS — Z510 Encounter for antineoplastic radiation therapy: Secondary | ICD-10-CM | POA: Diagnosis not present

## 2013-11-06 DIAGNOSIS — C61 Malignant neoplasm of prostate: Secondary | ICD-10-CM | POA: Diagnosis not present

## 2013-11-06 DIAGNOSIS — R300 Dysuria: Secondary | ICD-10-CM | POA: Diagnosis not present

## 2013-11-06 DIAGNOSIS — N342 Other urethritis: Secondary | ICD-10-CM | POA: Diagnosis not present

## 2013-11-06 DIAGNOSIS — R5381 Other malaise: Secondary | ICD-10-CM | POA: Diagnosis not present

## 2013-11-06 DIAGNOSIS — R3915 Urgency of urination: Secondary | ICD-10-CM | POA: Diagnosis not present

## 2013-11-06 DIAGNOSIS — R5383 Other fatigue: Secondary | ICD-10-CM | POA: Diagnosis not present

## 2013-11-07 ENCOUNTER — Ambulatory Visit
Admission: RE | Admit: 2013-11-07 | Discharge: 2013-11-07 | Disposition: A | Discharge status: Home / Self Care | Payer: Medicare Other | Source: Ambulatory Visit | Attending: Radiation Oncology | Admitting: Radiation Oncology

## 2013-11-07 DIAGNOSIS — R5381 Other malaise: Secondary | ICD-10-CM | POA: Diagnosis not present

## 2013-11-07 DIAGNOSIS — Z510 Encounter for antineoplastic radiation therapy: Secondary | ICD-10-CM | POA: Diagnosis not present

## 2013-11-07 DIAGNOSIS — C61 Malignant neoplasm of prostate: Secondary | ICD-10-CM | POA: Diagnosis not present

## 2013-11-07 DIAGNOSIS — R3915 Urgency of urination: Secondary | ICD-10-CM | POA: Diagnosis not present

## 2013-11-07 DIAGNOSIS — R300 Dysuria: Secondary | ICD-10-CM | POA: Diagnosis not present

## 2013-11-07 DIAGNOSIS — N342 Other urethritis: Secondary | ICD-10-CM | POA: Diagnosis not present

## 2013-11-08 ENCOUNTER — Ambulatory Visit
Admission: RE | Admit: 2013-11-08 | Discharge: 2013-11-08 | Disposition: A | Discharge status: Home / Self Care | Payer: Medicare Other | Source: Ambulatory Visit | Attending: Radiation Oncology | Admitting: Radiation Oncology

## 2013-11-08 DIAGNOSIS — R3915 Urgency of urination: Secondary | ICD-10-CM | POA: Diagnosis not present

## 2013-11-08 DIAGNOSIS — Z510 Encounter for antineoplastic radiation therapy: Secondary | ICD-10-CM | POA: Diagnosis not present

## 2013-11-08 DIAGNOSIS — R300 Dysuria: Secondary | ICD-10-CM | POA: Diagnosis not present

## 2013-11-08 DIAGNOSIS — R5383 Other fatigue: Secondary | ICD-10-CM | POA: Diagnosis not present

## 2013-11-08 DIAGNOSIS — N342 Other urethritis: Secondary | ICD-10-CM | POA: Diagnosis not present

## 2013-11-08 DIAGNOSIS — C61 Malignant neoplasm of prostate: Secondary | ICD-10-CM | POA: Diagnosis not present

## 2013-11-08 DIAGNOSIS — R5381 Other malaise: Secondary | ICD-10-CM | POA: Diagnosis not present

## 2013-11-09 ENCOUNTER — Ambulatory Visit
Admission: RE | Admit: 2013-11-09 | Discharge: 2013-11-09 | Disposition: A | Discharge status: Home / Self Care | Payer: Medicare Other | Source: Ambulatory Visit | Attending: Radiation Oncology | Admitting: Radiation Oncology

## 2013-11-09 DIAGNOSIS — Z510 Encounter for antineoplastic radiation therapy: Secondary | ICD-10-CM | POA: Diagnosis not present

## 2013-11-09 DIAGNOSIS — R5383 Other fatigue: Secondary | ICD-10-CM | POA: Diagnosis not present

## 2013-11-09 DIAGNOSIS — R5381 Other malaise: Secondary | ICD-10-CM | POA: Diagnosis not present

## 2013-11-09 DIAGNOSIS — R3915 Urgency of urination: Secondary | ICD-10-CM | POA: Diagnosis not present

## 2013-11-09 DIAGNOSIS — R300 Dysuria: Secondary | ICD-10-CM | POA: Diagnosis not present

## 2013-11-09 DIAGNOSIS — C61 Malignant neoplasm of prostate: Secondary | ICD-10-CM | POA: Diagnosis not present

## 2013-11-09 DIAGNOSIS — N342 Other urethritis: Secondary | ICD-10-CM | POA: Diagnosis not present

## 2013-11-12 ENCOUNTER — Encounter: Payer: Self-pay | Admitting: Radiation Oncology

## 2013-11-12 ENCOUNTER — Ambulatory Visit
Admission: RE | Admit: 2013-11-12 | Discharge: 2013-11-12 | Disposition: A | Discharge status: Home / Self Care | Payer: Medicare Other | Source: Ambulatory Visit | Attending: Radiation Oncology | Admitting: Radiation Oncology

## 2013-11-12 VITALS — BP 138/82 | HR 90 | Temp 97.6°F | Resp 20 | Wt 199.6 lb

## 2013-11-12 DIAGNOSIS — R300 Dysuria: Secondary | ICD-10-CM | POA: Diagnosis not present

## 2013-11-12 DIAGNOSIS — C61 Malignant neoplasm of prostate: Secondary | ICD-10-CM | POA: Diagnosis not present

## 2013-11-12 DIAGNOSIS — R3915 Urgency of urination: Secondary | ICD-10-CM | POA: Diagnosis not present

## 2013-11-12 DIAGNOSIS — R5381 Other malaise: Secondary | ICD-10-CM | POA: Diagnosis not present

## 2013-11-12 DIAGNOSIS — R5383 Other fatigue: Secondary | ICD-10-CM | POA: Diagnosis not present

## 2013-11-12 DIAGNOSIS — Z510 Encounter for antineoplastic radiation therapy: Secondary | ICD-10-CM | POA: Diagnosis not present

## 2013-11-12 DIAGNOSIS — N342 Other urethritis: Secondary | ICD-10-CM | POA: Diagnosis not present

## 2013-11-12 NOTE — Progress Notes (Signed)
Weekly Management Note:  Site: Prostate Current Dose:  7410  cGy Projected Dose: 7800  cGy  Narrative: The patient is seen today for routine under treatment assessment. CBCT/MVCT images/port films were reviewed. The chart was reviewed.   Bladder filling is excellent. He does report mild dysuria and will start a Azo.  Physical Examination:  Filed Vitals:   11/12/13 1022  BP: 138/82  Pulse: 90  Temp: 97.6 F (36.4 C)  Resp: 20  .  Weight: 199 lb 9.6 oz (90.538 kg). No change.  Impression: Tolerating radiation therapy well, except he does have mild radiation urethritis as expected. He has 2 more treatments.  Plan: Continue radiation therapy as planned. One-month followup after completion of radiation therapy.

## 2013-11-12 NOTE — Progress Notes (Signed)
Pt reports urinary frequency, urgency, dysuria. He states his bowels move more often, but he takes small dose Miralax daily due to history of small bowel obstruction. Pt denies fatigue, loss of appetite. He will complete treatment on Wed; gave him FU card.

## 2013-11-13 ENCOUNTER — Ambulatory Visit
Admission: RE | Admit: 2013-11-13 | Discharge: 2013-11-13 | Disposition: A | Discharge status: Home / Self Care | Payer: Medicare Other | Source: Ambulatory Visit | Attending: Radiation Oncology | Admitting: Radiation Oncology

## 2013-11-13 DIAGNOSIS — R5383 Other fatigue: Secondary | ICD-10-CM | POA: Diagnosis not present

## 2013-11-13 DIAGNOSIS — Z510 Encounter for antineoplastic radiation therapy: Secondary | ICD-10-CM | POA: Diagnosis not present

## 2013-11-13 DIAGNOSIS — R300 Dysuria: Secondary | ICD-10-CM | POA: Diagnosis not present

## 2013-11-13 DIAGNOSIS — N342 Other urethritis: Secondary | ICD-10-CM | POA: Diagnosis not present

## 2013-11-13 DIAGNOSIS — R5381 Other malaise: Secondary | ICD-10-CM | POA: Diagnosis not present

## 2013-11-13 DIAGNOSIS — R3915 Urgency of urination: Secondary | ICD-10-CM | POA: Diagnosis not present

## 2013-11-13 DIAGNOSIS — C61 Malignant neoplasm of prostate: Secondary | ICD-10-CM | POA: Diagnosis not present

## 2013-11-14 ENCOUNTER — Ambulatory Visit
Admission: RE | Admit: 2013-11-14 | Discharge: 2013-11-14 | Disposition: A | Discharge status: Home / Self Care | Payer: Medicare Other | Source: Ambulatory Visit | Attending: Radiation Oncology | Admitting: Radiation Oncology

## 2013-11-14 ENCOUNTER — Telehealth: Payer: Self-pay | Admitting: Internal Medicine

## 2013-11-14 DIAGNOSIS — R5383 Other fatigue: Secondary | ICD-10-CM | POA: Diagnosis not present

## 2013-11-14 DIAGNOSIS — Z510 Encounter for antineoplastic radiation therapy: Secondary | ICD-10-CM | POA: Diagnosis not present

## 2013-11-14 DIAGNOSIS — C61 Malignant neoplasm of prostate: Secondary | ICD-10-CM | POA: Diagnosis not present

## 2013-11-14 DIAGNOSIS — N342 Other urethritis: Secondary | ICD-10-CM | POA: Diagnosis not present

## 2013-11-14 DIAGNOSIS — R300 Dysuria: Secondary | ICD-10-CM | POA: Diagnosis not present

## 2013-11-14 DIAGNOSIS — R3915 Urgency of urination: Secondary | ICD-10-CM | POA: Diagnosis not present

## 2013-11-14 DIAGNOSIS — R5381 Other malaise: Secondary | ICD-10-CM | POA: Diagnosis not present

## 2013-11-14 NOTE — Telephone Encounter (Signed)
Advised pt's wife that per CY would be helpful to have for appointment 11/19/13. She will pick up download from [facility] and bring in. Nothing further needed

## 2013-11-15 ENCOUNTER — Encounter: Payer: Self-pay | Admitting: Radiation Oncology

## 2013-11-15 NOTE — Progress Notes (Signed)
Chart note: On 09/20/2013 Mr. Biel began his IMRT. He was treated with 2 VMAT arcs with dynamic multileaf collimation corresponding to one set of IMRT treatment devices [phone]).

## 2013-11-15 NOTE — Progress Notes (Signed)
[facility] End of Treatment Note  Name:Daniel Singleton  Date: 11/15/2013 TMH:[id number] DOB:[birth year]   Status:outpatient    CC: [name], MD  Dr. Rolan Bucco  REFERRING PHYSICIAN: Dr. Rolan Bucco     DIAGNOSIS:  Stage TI C. intermediate risk adenocarcinoma prostate  INDICATION FOR TREATMENT: Curative   TREATMENT DATES: 09/20/2013 through 11/14/2013                           SITE/DOSE:  Prostate 7800 cGy in 40 sessions, seminal vesicles 5600 cGy in 40 sessions                          BEAMS/ENERGY:  6 MV photons, dual ARC VMAT IMRT                 NARRATIVE: Ms. Coker tolerated treatment well with only mild radiation urethritis by completion of therapy.                            PLAN: Routine followup in one month. Patient instructed to call if questions or worsening complaints in interim. Of note is that he is receiving short-term androgen deprivation therapy for a total of approximately 6 months.

## 2013-11-16 ENCOUNTER — Ambulatory Visit: Payer: Medicare Other | Admitting: Internal Medicine

## 2013-11-20 ENCOUNTER — Ambulatory Visit (INDEPENDENT_AMBULATORY_CARE_PROVIDER_SITE_OTHER): Payer: Medicare Other | Admitting: Internal Medicine

## 2013-11-20 ENCOUNTER — Encounter: Payer: Self-pay | Admitting: Internal Medicine

## 2013-11-20 VITALS — BP 124/62 | HR 72 | Ht 70.0 in | Wt 202.4 lb

## 2013-11-20 DIAGNOSIS — J438 Other emphysema: Secondary | ICD-10-CM | POA: Diagnosis not present

## 2013-11-20 DIAGNOSIS — G4733 Obstructive sleep apnea (adult) (pediatric): Secondary | ICD-10-CM

## 2013-11-20 DIAGNOSIS — I2510 Atherosclerotic heart disease of native coronary artery without angina pectoris: Secondary | ICD-10-CM | POA: Diagnosis not present

## 2013-11-20 DIAGNOSIS — Z77090 Contact with and (suspected) exposure to asbestos: Secondary | ICD-10-CM

## 2013-11-20 DIAGNOSIS — J439 Emphysema, unspecified: Secondary | ICD-10-CM

## 2013-11-20 NOTE — Patient Instructions (Signed)
We can continue CPAP and O2 while sleeping  Please call as needed

## 2013-11-20 NOTE — Progress Notes (Signed)
39 yoM self referred for  COPD with emphysema diagnosed 8101, complicated by CAD/CABG 4V, DM.       Here with wife Smoked one pack per day until quitting in 2000. Retired from [employer] with associated dust. 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 He says this was associated with asbestos exposure from brake dust. Issues are in legal consideration and he notes that no diagnosis has been made. Has had pneumonia twice, and pneumonia vaccine. History of sleep apnea diagnosis but quit CPAP. Wife confirms he snores wearing a chin strap and sleeping in a recliner. Significant injury at work 01/17/1999, broken tibia and "degloving" of R  lower leg.  01/10/13- self referred for  COPD with emphysema diagnosed 7510, OSA, complicated by CAD/CABG 4V, DM, hx major trauma leg.       Here with wife Mowing the yard with mask in dusty but will still cough. Wife says if he lies down a recliner he holds his breath saturation will drop to 86%.he says he has been wearing his CPAP again/Advanced, but she describes apneas when he is sleeping without it.  PFT 01/01/2013: Minimal obstructive airways disease, air trapping with increased residual volume, mild response to bronchodilator, diffusion slightly reduced. FVC 4.11/96%, FEV1 2.59/82%, FEV1/FVC 0.63/85%, FEF 25-75% 1.63/68%. TLC 100%, DLCO 76%. CXR 11/23/12 IMPRESSION:  Underlying emphysema. No edema or consolidation.  Original Report Authenticated By: Lowella Grip, M.D.  03/05/13- self referred for  COPD with emphysema diagnosed 2585, OSA, complicated by CAD/CABG 4V, DM, hx major trauma leg.       Here with wife FOLLOWS FOR: pt reports breathing is doing well-- states concentrator is making a "racket and is on its last leg"-- denies any other concerns at this time. CPAP Autopap/ Advanced Mask hurts bridge of nose if he stretches it on too tightly. Oxygen concentrator is getting loud. Compliance okay but inadequate control  based on download. Needs broader pressure range for autotitration.  09/04/13- 38 yoM former smoker self referred for  COPD with emphysema, hx asbestos exposure, OSA, complicated by CAD/CABG 4V, DM, hx major trauma leg.       Here with wife CPAP AutoPAP Advanced- not using-blows too hard. Does wearing oxygen 3 L/Advanced for sleep. Wife says she rarely notices apneas now when he is not wearing CPAP. FOLLOWS FOR: Pt states breathing is doing well since last visit; recent hospital for intestine blockage. Pending XRT for prostate cancer  11/20/13- 72 yoM former smoker self referred for  COPD with emphysema, hx asbestos exposure, OSA, complicated by CAD/CABG 4V, DM, hx major trauma leg, prostate Ca.       Here with wife FOLLOWS FOR: wears CPAPAuto or 10?/ Advanced every night for about 6 hours; Pt states [facility] did ONO since visit off CPAP- seeking report Pressure working well for patient; DME is [facility], Still using oxygen at 3 L. No recent nitroglycerin. Not using inhalers. Recent hospital for small bowel obstruction.   ROS-see HPI Constitutional:   No-   weight loss, night sweats, fevers, chills, +fatigue, lassitude. HEENT:   No-  headaches, difficulty swallowing, tooth/dental problems, sore throat,       No-  sneezing, itching, ear ache, nasal congestion, post nasal drip,  CV:  No-   chest pain, orthopnea, PND, swelling in lower extremities, anasarca, dizziness, palpitations Resp: + shortness of breath with exertion or at rest.              No-  productive cough,  No non-productive cough,  No- coughing up of blood.              No-   change in color of mucus.  No- wheezing.   Skin: No-   rash or lesions. GI:  No-   heartburn, indigestion, abdominal pain, nausea, vomiting, GU:  MS:  No-   joint pain or swelling.  . Neuro-     nothing unusual Psych:  No- change in mood or affect. + depression or anxiety.  No memory loss.  OBJ- Physical Exam General- Alert, Oriented, Affect-appropriate, Distress- none  acute. Trim Skin- rash-none, lesions- none, excoriation- none Lymphadenopathy- none Head- atraumatic            Eyes- Gross vision intact, PERRLA, conjunctivae and secretions clear            Ears- Hearing, canals-normal            Nose-  Clear, no-Septal dev, mucus, polyps, erosion, perforation             Throat- Mallampati II , mucosa clear , drainage- none, tonsils- atrophic Neck- flexible , trachea midline, no stridor , thyroid nl, carotid no bruit Chest - symmetrical excursion , unlabored           Heart/CV- RRR , no murmur , no gallop  , no rub, nl s1 s2                           - JVD- none , edema- none, stasis changes- none, varices- none           Lung- +diminished butclear to P&A, wheeze- none, cough- none , dullness-none, rub- none           Chest wall-  Abd-  Br/ Gen/ Rectal- Not done, not indicated Extrem- cyanosis- none, clubbing, none, atrophy- none, strength- nl. -+cane. Support stockings Neuro- grossly intact to observation

## 2013-12-17 ENCOUNTER — Encounter: Payer: Self-pay | Admitting: *Deleted

## 2013-12-18 ENCOUNTER — Ambulatory Visit
Admission: RE | Admit: 2013-12-18 | Discharge: 2013-12-18 | Disposition: A | Discharge status: Home / Self Care | Payer: Medicare Other | Source: Ambulatory Visit | Attending: Radiation Oncology | Admitting: Radiation Oncology

## 2013-12-18 ENCOUNTER — Encounter: Payer: Self-pay | Admitting: Radiation Oncology

## 2013-12-18 VITALS — BP 145/88 | HR 70 | Temp 98.0°F | Resp 20 | Wt 203.6 lb

## 2013-12-18 DIAGNOSIS — C61 Malignant neoplasm of prostate: Secondary | ICD-10-CM

## 2013-12-18 NOTE — Progress Notes (Signed)
CC: Dr. Rolan Bucco  Followup note:  Daniel Singleton returns today approximately 1 month following completion of external beam/IMRT along with 6 months of androgen deprivation therapy in the management of his stage TI C. intermediate risk adenocarcinoma prostate. He tells her that his urinary and GI habits are back to baseline. He is having less fatigue. I believe that his Depo-Lupron injection the last through May, completing his androgen deprivation therapy. He will see Dr. Jasmine December for a followup visit and PSA determination in early June.  His examination: Alert and oriented. Filed Vitals:   12/18/13 1006  BP: 145/88  Pulse: 70  Temp: 98 F (36.7 C)  Resp: 20   Rectal examination is not performed today.  Impression: Satisfactory progress. I explained to Daniel Singleton that his PSA will be artificially lowered through the summer, and perhaps fall because of his androgen deprivation therapy. His PSA may rise between June and this fall before reaching  a nadir in 1-2 years.  Plan: Followup visit with Dr. Jasmine December in June along with a PSA determination. I've not scheduled Daniel Singleton for a formal followup visit and I ask that Dr. Jasmine December keep me posted on his progress.

## 2013-12-18 NOTE — Progress Notes (Signed)
Pt states he continues to have urinary frequency, nocturia x 1-2. He denies other urinary issues, denies bowel issues, loss of appetite. He states his fatigue resolved. Pt has FU w/Dr Jasmine December on 02/01/14 preceded by lab on 01/25/14.

## 2013-12-19 NOTE — Assessment & Plan Note (Signed)
CPAP auto with O2 2l/ Advanced-noncompliant. CPAP may now be set at 10. Compliance has been better.

## 2013-12-19 NOTE — Assessment & Plan Note (Signed)
Long-term monitoring

## 2013-12-19 NOTE — Assessment & Plan Note (Signed)
Seeking overnight oximetry report

## 2014-01-01 DIAGNOSIS — E785 Hyperlipidemia, unspecified: Secondary | ICD-10-CM | POA: Diagnosis not present

## 2014-01-01 DIAGNOSIS — B351 Tinea unguium: Secondary | ICD-10-CM | POA: Diagnosis not present

## 2014-01-01 DIAGNOSIS — I10 Essential (primary) hypertension: Secondary | ICD-10-CM | POA: Diagnosis not present

## 2014-01-01 DIAGNOSIS — E119 Type 2 diabetes mellitus without complications: Secondary | ICD-10-CM | POA: Diagnosis not present

## 2014-01-16 ENCOUNTER — Other Ambulatory Visit: Payer: Self-pay | Admitting: *Deleted

## 2014-01-16 MED ORDER — OMEGA-3-ACID ETHYL ESTERS 1 G PO CAPS: 1.0000 g | ORAL_CAPSULE | Freq: Every day | ORAL | Status: DC

## 2014-01-16 MED ORDER — RANOLAZINE ER 500 MG PO TB12: 500.0000 mg | ORAL_TABLET | Freq: Two times a day (BID) | ORAL | Status: DC

## 2014-01-16 MED ORDER — EZETIMIBE 10 MG PO TABS: 10.0000 mg | ORAL_TABLET | Freq: Every morning | ORAL | Status: DC

## 2014-01-16 NOTE — Telephone Encounter (Signed)
Rx refill sent to patient pharmacy   

## 2014-01-25 DIAGNOSIS — C61 Malignant neoplasm of prostate: Secondary | ICD-10-CM | POA: Diagnosis not present

## 2014-01-29 DIAGNOSIS — C61 Malignant neoplasm of prostate: Secondary | ICD-10-CM | POA: Diagnosis not present

## 2014-01-29 DIAGNOSIS — R350 Frequency of micturition: Secondary | ICD-10-CM | POA: Diagnosis not present

## 2014-01-29 DIAGNOSIS — R351 Nocturia: Secondary | ICD-10-CM | POA: Diagnosis not present

## 2014-03-07 ENCOUNTER — Other Ambulatory Visit: Payer: Self-pay | Admitting: *Deleted

## 2014-03-07 MED ORDER — ROSUVASTATIN CALCIUM 20 MG PO TABS
20.0000 mg | ORAL_TABLET | Freq: Every day | ORAL | Status: DC
Start: 2014-03-07 — End: 2014-08-19

## 2014-03-07 MED ORDER — METOPROLOL SUCCINATE ER 25 MG PO TB24: 25.0000 mg | ORAL_TABLET | Freq: Every day | ORAL | Status: DC

## 2014-03-07 NOTE — Telephone Encounter (Signed)
Rx was sent to pharmacy electronically. 

## 2014-03-19 ENCOUNTER — Emergency Department (HOSPITAL_COMMUNITY): Payer: Medicare Other

## 2014-03-19 ENCOUNTER — Encounter (HOSPITAL_COMMUNITY): Payer: Self-pay | Admitting: Emergency Medicine

## 2014-03-19 ENCOUNTER — Inpatient Hospital Stay (HOSPITAL_COMMUNITY)
Admission: EM | Admit: 2014-03-19 | Discharge: 2014-03-21 | DRG: 149 | Disposition: A | Discharge status: Home / Self Care | Payer: Medicare Other | Attending: Internal Medicine | Admitting: Internal Medicine

## 2014-03-19 DIAGNOSIS — F329 Major depressive disorder, single episode, unspecified: Secondary | ICD-10-CM | POA: Diagnosis present

## 2014-03-19 DIAGNOSIS — K219 Gastro-esophageal reflux disease without esophagitis: Secondary | ICD-10-CM | POA: Diagnosis present

## 2014-03-19 DIAGNOSIS — G4733 Obstructive sleep apnea (adult) (pediatric): Secondary | ICD-10-CM

## 2014-03-19 DIAGNOSIS — R404 Transient alteration of awareness: Secondary | ICD-10-CM | POA: Diagnosis not present

## 2014-03-19 DIAGNOSIS — Z7982 Long term (current) use of aspirin: Secondary | ICD-10-CM

## 2014-03-19 DIAGNOSIS — J45909 Unspecified asthma, uncomplicated: Secondary | ICD-10-CM | POA: Diagnosis present

## 2014-03-19 DIAGNOSIS — E119 Type 2 diabetes mellitus without complications: Secondary | ICD-10-CM | POA: Diagnosis present

## 2014-03-19 DIAGNOSIS — Z8546 Personal history of malignant neoplasm of prostate: Secondary | ICD-10-CM | POA: Diagnosis not present

## 2014-03-19 DIAGNOSIS — Z8249 Family history of ischemic heart disease and other diseases of the circulatory system: Secondary | ICD-10-CM

## 2014-03-19 DIAGNOSIS — I2510 Atherosclerotic heart disease of native coronary artery without angina pectoris: Secondary | ICD-10-CM | POA: Diagnosis present

## 2014-03-19 DIAGNOSIS — R279 Unspecified lack of coordination: Secondary | ICD-10-CM | POA: Diagnosis present

## 2014-03-19 DIAGNOSIS — D696 Thrombocytopenia, unspecified: Secondary | ICD-10-CM | POA: Diagnosis not present

## 2014-03-19 DIAGNOSIS — G450 Vertebro-basilar artery syndrome: Secondary | ICD-10-CM | POA: Diagnosis present

## 2014-03-19 DIAGNOSIS — R42 Dizziness and giddiness: Principal | ICD-10-CM | POA: Diagnosis present

## 2014-03-19 DIAGNOSIS — J984 Other disorders of lung: Secondary | ICD-10-CM | POA: Diagnosis not present

## 2014-03-19 DIAGNOSIS — E7800 Pure hypercholesterolemia, unspecified: Secondary | ICD-10-CM | POA: Diagnosis present

## 2014-03-19 DIAGNOSIS — I10 Essential (primary) hypertension: Secondary | ICD-10-CM | POA: Diagnosis present

## 2014-03-19 DIAGNOSIS — J438 Other emphysema: Secondary | ICD-10-CM | POA: Diagnosis present

## 2014-03-19 DIAGNOSIS — R112 Nausea with vomiting, unspecified: Secondary | ICD-10-CM | POA: Diagnosis not present

## 2014-03-19 DIAGNOSIS — Z951 Presence of aortocoronary bypass graft: Secondary | ICD-10-CM

## 2014-03-19 DIAGNOSIS — E785 Hyperlipidemia, unspecified: Secondary | ICD-10-CM | POA: Diagnosis present

## 2014-03-19 DIAGNOSIS — R270 Ataxia, unspecified: Secondary | ICD-10-CM | POA: Diagnosis present

## 2014-03-19 DIAGNOSIS — Z87891 Personal history of nicotine dependence: Secondary | ICD-10-CM | POA: Diagnosis not present

## 2014-03-19 DIAGNOSIS — J439 Emphysema, unspecified: Secondary | ICD-10-CM

## 2014-03-19 DIAGNOSIS — F3289 Other specified depressive episodes: Secondary | ICD-10-CM | POA: Diagnosis present

## 2014-03-19 DIAGNOSIS — H543 Unqualified visual loss, both eyes: Secondary | ICD-10-CM | POA: Diagnosis not present

## 2014-03-19 DIAGNOSIS — J449 Chronic obstructive pulmonary disease, unspecified: Secondary | ICD-10-CM | POA: Diagnosis present

## 2014-03-19 DIAGNOSIS — I369 Nonrheumatic tricuspid valve disorder, unspecified: Secondary | ICD-10-CM | POA: Diagnosis not present

## 2014-03-19 DIAGNOSIS — Z923 Personal history of irradiation: Secondary | ICD-10-CM | POA: Diagnosis not present

## 2014-03-19 DIAGNOSIS — C61 Malignant neoplasm of prostate: Secondary | ICD-10-CM | POA: Diagnosis present

## 2014-03-19 DIAGNOSIS — K56600 Partial intestinal obstruction, unspecified as to cause: Secondary | ICD-10-CM

## 2014-03-19 DIAGNOSIS — I2583 Coronary atherosclerosis due to lipid rich plaque: Secondary | ICD-10-CM

## 2014-03-19 DIAGNOSIS — F411 Generalized anxiety disorder: Secondary | ICD-10-CM | POA: Diagnosis present

## 2014-03-19 DIAGNOSIS — Z77090 Contact with and (suspected) exposure to asbestos: Secondary | ICD-10-CM

## 2014-03-19 LAB — BASIC METABOLIC PANEL
ANION GAP: 12 (ref 5–15)
BUN: 12 mg/dL (ref 6–23)
CALCIUM: 8.9 mg/dL (ref 8.4–10.5)
CO2: 28 mEq/L (ref 19–32)
CREATININE: 1.01 mg/dL (ref 0.50–1.35)
Chloride: 98 mEq/L (ref 96–112)
GFR calc non Af Amer: 73 mL/min — ABNORMAL LOW (ref >90)
GFR, EST AFRICAN AMERICAN: 84 mL/min — AB (ref >90)
Glucose, Bld: 173 mg/dL — ABNORMAL HIGH (ref 70–99)
Potassium: 4.2 mEq/L (ref 3.7–5.3)
Sodium: 138 mEq/L (ref 137–147)

## 2014-03-19 LAB — CBC
HCT: 37.5 % — ABNORMAL LOW (ref 39.0–52.0)
Hemoglobin: 13.1 g/dL (ref 13.0–17.0)
MCH: 31.1 pg (ref 26.0–34.0)
MCHC: 34.9 g/dL (ref 30.0–36.0)
MCV: 89.1 fL (ref 78.0–100.0)
PLATELETS: DECREASED 10*3/uL (ref 150–400)
RBC: 4.21 MIL/uL — ABNORMAL LOW (ref 4.22–5.81)
RDW: 13.8 % (ref 11.5–15.5)
WBC: 8.4 10*3/uL (ref 4.0–10.5)

## 2014-03-19 LAB — I-STAT TROPONIN, ED: Troponin i, poc: 0 ng/mL (ref 0.00–0.08)

## 2014-03-19 MED ORDER — ONDANSETRON HCL 4 MG/2ML IJ SOLN
4.0000 mg | Freq: Once | INTRAMUSCULAR | Status: AC
  Administered 2014-03-19: 4 mg via INTRAVENOUS
  Filled 2014-03-19: qty 2

## 2014-03-19 MED ORDER — SODIUM CHLORIDE 0.9 % IV BOLUS (SEPSIS)
1000.0000 mL | Freq: Once | INTRAVENOUS | Status: AC
  Administered 2014-03-19: 1000 mL via INTRAVENOUS

## 2014-03-19 MED ORDER — MECLIZINE HCL 25 MG PO TABS
25.0000 mg | ORAL_TABLET | Freq: Once | ORAL | Status: AC
  Administered 2014-03-19: 25 mg via ORAL
  Filled 2014-03-19: qty 1

## 2014-03-19 NOTE — ED Notes (Signed)
Pt to xray at this time.

## 2014-03-19 NOTE — ED Notes (Addendum)
Pt to ED via [facility] with c/o vomiting.  Pt st's he was sitting at table eating dinner when he lost vision on both eyes then had projectile vomiting.    Pt st's vision has returned but is blurry and feels dizzy.  Pt alert and oriented x's 3.  CBG by EMS 167.  Zofran 4mg  IV given by EMS

## 2014-03-19 NOTE — ED Provider Notes (Signed)
CSN: [id number]     Arrival date & time 03/19/14  1805 History   First MD Initiated Contact with Patient 03/19/14 1826     Chief Complaint  Patient presents with  . Emesis     (Consider location/radiation/quality/duration/timing/severity/associated sxs/prior Treatment) Patient is a 73 y.o. male presenting with dizziness.  Dizziness Quality:  Lightheadedness and head spinning Severity:  Moderate Onset quality:  Sudden Timing:  Constant Progression:  Unchanged Chronicity:  New Associated symptoms: no blood in stool, no chest pain, no headaches and no shortness of breath     Past Medical History  Diagnosis Date  . Hypertension   . Emphysema   . Diabetes   . Hypercholesteremia   . PONV (postoperative nausea and vomiting)   . Angina at rest   . Coronary artery disease     recently started seeing Dr. Claiborne Billings   . Arthritis   . GERD (gastroesophageal reflux disease)   . Elevated PSA   . Sleep apnea     c pap with oxygen at 2.5 L  . Prostate cancer 05/23/13    gleason 3+4=7, volume 31 cc  . Asthma   . Depression   . Anxiety   . Hx of radiation therapy 09/20/13- 11/14/13    prostate 7800 cGy 40 sessions, seminal vesicles 5600 cGy in 40 sessions   Past Surgical History  Procedure Laterality Date  . Heart bypass  2006    Quad  . Degloving rt leg injury  2000    had skin grafts  . Hernia repair  2008    x 3 w/mesh, bilat inguinla, umbilical  . Back surgery  2006    discectomy lumbar  . Tonsillectomy      w/adenoidectomy  . Prostate biopsy N/A 05/23/2013    Procedure: BIOPSY TRANSRECTAL ULTRASONIC PROSTATE (TUBP);  Surgeon: Molli Hazard, MD;  Location: [facility];  Service: Urology;  Laterality: N/A;  prostate nerve block  . Back surgery    . Foot surgery    . Back surgery     Family History  Problem Relation Age of Onset  . Heart disease Mother   . Heart attack Mother   . Cancer Sister   . Depression Sister    History  Substance Use Topics  . Smoking status:  Former Smoker -- 1.00 packs/day for 40 years    Types: Cigarettes    Quit date: 01/17/1999  . Smokeless tobacco: Never Used  . Alcohol Use: Yes     Comment: 1 beer weekly    Review of Systems  Constitutional: Negative for activity change.  HENT: Negative for congestion.   Eyes: Negative for visual disturbance.  Respiratory: Negative for cough and shortness of breath.   Cardiovascular: Negative for chest pain and leg swelling.  Gastrointestinal: Negative for abdominal pain and blood in stool.  Genitourinary: Negative for dysuria and hematuria.  Musculoskeletal: Negative for back pain.  Skin: Negative for color change.  Neurological: Positive for dizziness and light-headedness. Negative for syncope and headaches.       Ataxia  Psychiatric/Behavioral: Negative for agitation.  All other systems reviewed and are negative.     Allergies  Review of patient's allergies indicates no known allergies.  Home Medications   Prior to Admission medications   Medication Sig Start Date End Date Taking? Authorizing Provider  albuterol (PROVENTIL HFA;VENTOLIN HFA) 108 (90 BASE) MCG/ACT inhaler Inhale 2 puffs into the lungs every 6 (six) hours as needed for wheezing. 05/28/13  Yes Deneise Lever, MD  aspirin 81 MG tablet Take 1 tablet (81 mg total) by mouth daily. 08/21/13  Yes [name], MD  DULoxetine (CYMBALTA) 60 MG capsule Take 60 mg by mouth daily.   Yes Historical Provider, MD  ezetimibe (ZETIA) 10 MG tablet Take 1 tablet (10 mg total) by mouth every morning. 01/16/14  Yes Troy Sine, MD  Melatonin 10 MG CAPS Take 10 mg by mouth daily.   Yes Historical Provider, MD  metoprolol succinate (TOPROL XL) 25 MG 24 hr tablet Take 1 tablet (25 mg total) by mouth daily. 03/07/14  Yes Troy Sine, MD  nitroGLYCERIN (NITROSTAT) 0.3 MG SL tablet Place 0.3 mg under the tongue every 5 (five) minutes as needed for chest pain.   Yes Historical Provider, MD  omega-3 acid ethyl esters (LOVAZA) 1 G  capsule Take 1 capsule (1 g total) by mouth daily. 01/16/14  Yes Troy Sine, MD  ranolazine (RANEXA) 500 MG 12 hr tablet Take 1 tablet (500 mg total) by mouth 2 (two) times daily. 01/16/14  Yes Troy Sine, MD  rosuvastatin (CRESTOR) 20 MG tablet Take 1 tablet (20 mg total) by mouth daily. 03/07/14  Yes Troy Sine, MD   There were no vitals taken for this visit. Physical Exam  Nursing note and vitals reviewed. Constitutional: He is oriented to person, place, and time. He appears well-developed and well-nourished.  HENT:  Head: Normocephalic.  Eyes: Pupils are equal, round, and reactive to light.  Horizontal and vertical nystagmus noted  Neck: Neck supple.  Cardiovascular: Normal rate and regular rhythm.  Exam reveals no gallop and no friction rub.   No murmur heard. Pulmonary/Chest: Effort normal. No respiratory distress.  Abdominal: Soft. He exhibits no distension. There is no tenderness.  Musculoskeletal: He exhibits no edema.  Neurological: He is alert and oriented to person, place, and time.  Skin: Skin is warm.  Psychiatric: He has a normal mood and affect.   Cranial nerves III-XII grossly intact, nystagmus noted  Strength 5+/5+ to upper and lower extremities bilaterally with resistance applied, equal distribution noted Strength intact to MCP, PIP, DIP joints of  hand Negative arm drift Fine motor skills intact Heel to knee down shin normal bilaterally Gait- ataxia      ED Course  Procedures (including critical care time) Labs Review Labs Reviewed  CBC - Abnormal; Notable for the following:    RBC 4.21 (*)    HCT 37.5 (*)    All other components within normal limits  BASIC METABOLIC PANEL - Abnormal; Notable for the following:    Glucose, Bld 173 (*)    GFR calc non Af Amer 73 (*)    GFR calc Af Amer 84 (*)    All other components within normal limits  COMPREHENSIVE METABOLIC PANEL - Abnormal; Notable for the following:    Glucose, Bld 152 (*)    GFR calc  non Af Amer 84 (*)    Anion gap 16 (*)    All other components within normal limits  CBC WITH DIFFERENTIAL - Abnormal; Notable for the following:    HCT 38.8 (*)    Platelets 130 (*)    Neutrophils Relative % 81 (*)    Lymphocytes Relative 10 (*)    All other components within normal limits  HEMOGLOBIN A1C - Abnormal; Notable for the following:    Hemoglobin A1C 6.5 (*)    Mean Plasma Glucose 140 (*)    All other components within normal limits  TSH  LIPID PANEL  CBC  I-STAT TROPOININ, ED    Imaging Review Dg Chest 2 View  03/19/2014   CLINICAL DATA:  Cervical and vomiting today.  EXAM: CHEST  2 VIEW  COMPARISON:  Chest x-rays dated 08/15/2013 and 11/23/2012  FINDINGS: Heart size and pulmonary vascularity are normal. There are no acute infiltrates or effusions. There is slight scarring in the lingula. No acute osseous abnormality. Old compression deformity of what appears to be L1.  IMPRESSION: No acute abnormalities.   Electronically Signed   By: Rozetta Nunnery M.D.   On: 03/19/2014 20:24   Ct Head Wo Contrast  03/20/2014   CLINICAL DATA:  Vomiting and ataxia. Acute loss of vision in both eyes which has since returned. Dizziness.  EXAM: CT HEAD WITHOUT CONTRAST  TECHNIQUE: Contiguous axial images were obtained from the base of the skull through the vertex without intravenous contrast.  COMPARISON:  None.  FINDINGS: Mild diffuse cerebral atrophy. No ventricular dilatation. No mass effect or midline shift. No abnormal extra-axial fluid collections. Gray-white matter junctions are distinct. Basal cisterns are not effaced. No evidence of acute intracranial hemorrhage. No depressed skull fractures. Visualized paranasal sinuses and mastoid air cells are not opacified. Vascular calcifications.  IMPRESSION: No acute intracranial abnormalities. Chronic atrophy and small vessel ischemic changes.   Electronically Signed   By: Lucienne Capers M.D.   On: 03/20/2014 00:17       EKG  Interpretation   Date/Time:  Tuesday March 19 2014 18:16:25 EDT Ventricular Rate:  69 PR Interval:  189 QRS Duration: 95 QT Interval:  414 QTC Calculation: 443 R Axis:   71 Text Interpretation:  Sinus rhythm Atrial premature complexes Confirmed by  DOCHERTY  MD, MEGAN ([id number]) on 03/19/2014 8:11:07 PM      MDM   Final diagnoses:  Ataxia  Hyperlipidemia with target LDL less than 70  Nausea and vomiting, vomiting of unspecified type  Prostate ca   73 year old male with past medical history of hypertension and diabetes that presents with dizziness. Patient initially describes symptoms consistent with presyncope and was treated with normal saline. Patient did not have significant improvement. Patient was noted on physical exam to have vertical and horizontal nystagmus. Patient was treated with meclizine which did not help his symptoms. On neurological exam the patient has deficits of ataxia with gait and no nystagmus otherwise no focal deficits. Other cerebellar testing was intact. The neurological findings in the ataxic gait was determined that the patient would require neurology consultation. CT head was performed to have no focal findings. Patient was later admitted for further workup.    Claudean Severance, MD 03/21/14 [phone]

## 2014-03-20 ENCOUNTER — Encounter (HOSPITAL_COMMUNITY): Payer: Self-pay | Admitting: Internal Medicine

## 2014-03-20 ENCOUNTER — Telehealth: Payer: Self-pay | Admitting: Cardiovascular Disease

## 2014-03-20 ENCOUNTER — Inpatient Hospital Stay (HOSPITAL_COMMUNITY): Payer: Medicare Other

## 2014-03-20 ENCOUNTER — Emergency Department (HOSPITAL_COMMUNITY): Payer: Medicare Other

## 2014-03-20 DIAGNOSIS — R112 Nausea with vomiting, unspecified: Secondary | ICD-10-CM

## 2014-03-20 DIAGNOSIS — E119 Type 2 diabetes mellitus without complications: Secondary | ICD-10-CM | POA: Diagnosis not present

## 2014-03-20 DIAGNOSIS — R42 Dizziness and giddiness: Secondary | ICD-10-CM | POA: Diagnosis not present

## 2014-03-20 DIAGNOSIS — F411 Generalized anxiety disorder: Secondary | ICD-10-CM | POA: Diagnosis present

## 2014-03-20 DIAGNOSIS — R279 Unspecified lack of coordination: Secondary | ICD-10-CM

## 2014-03-20 DIAGNOSIS — C61 Malignant neoplasm of prostate: Secondary | ICD-10-CM | POA: Diagnosis not present

## 2014-03-20 DIAGNOSIS — G4733 Obstructive sleep apnea (adult) (pediatric): Secondary | ICD-10-CM | POA: Diagnosis present

## 2014-03-20 DIAGNOSIS — E785 Hyperlipidemia, unspecified: Secondary | ICD-10-CM | POA: Diagnosis not present

## 2014-03-20 DIAGNOSIS — Z951 Presence of aortocoronary bypass graft: Secondary | ICD-10-CM | POA: Diagnosis not present

## 2014-03-20 DIAGNOSIS — I2510 Atherosclerotic heart disease of native coronary artery without angina pectoris: Secondary | ICD-10-CM | POA: Diagnosis present

## 2014-03-20 DIAGNOSIS — Z87891 Personal history of nicotine dependence: Secondary | ICD-10-CM | POA: Diagnosis not present

## 2014-03-20 DIAGNOSIS — Z8249 Family history of ischemic heart disease and other diseases of the circulatory system: Secondary | ICD-10-CM | POA: Diagnosis not present

## 2014-03-20 DIAGNOSIS — I10 Essential (primary) hypertension: Secondary | ICD-10-CM | POA: Diagnosis present

## 2014-03-20 DIAGNOSIS — H543 Unqualified visual loss, both eyes: Secondary | ICD-10-CM | POA: Diagnosis not present

## 2014-03-20 DIAGNOSIS — F3289 Other specified depressive episodes: Secondary | ICD-10-CM | POA: Diagnosis present

## 2014-03-20 DIAGNOSIS — D696 Thrombocytopenia, unspecified: Secondary | ICD-10-CM | POA: Diagnosis not present

## 2014-03-20 DIAGNOSIS — F329 Major depressive disorder, single episode, unspecified: Secondary | ICD-10-CM | POA: Diagnosis present

## 2014-03-20 DIAGNOSIS — R270 Ataxia, unspecified: Secondary | ICD-10-CM | POA: Diagnosis present

## 2014-03-20 DIAGNOSIS — E7800 Pure hypercholesterolemia, unspecified: Secondary | ICD-10-CM | POA: Diagnosis present

## 2014-03-20 DIAGNOSIS — Z923 Personal history of irradiation: Secondary | ICD-10-CM | POA: Diagnosis not present

## 2014-03-20 DIAGNOSIS — Z7982 Long term (current) use of aspirin: Secondary | ICD-10-CM | POA: Diagnosis not present

## 2014-03-20 DIAGNOSIS — K219 Gastro-esophageal reflux disease without esophagitis: Secondary | ICD-10-CM | POA: Diagnosis present

## 2014-03-20 DIAGNOSIS — Z8546 Personal history of malignant neoplasm of prostate: Secondary | ICD-10-CM | POA: Diagnosis not present

## 2014-03-20 DIAGNOSIS — I369 Nonrheumatic tricuspid valve disorder, unspecified: Secondary | ICD-10-CM

## 2014-03-20 DIAGNOSIS — J438 Other emphysema: Secondary | ICD-10-CM | POA: Diagnosis not present

## 2014-03-20 DIAGNOSIS — J45909 Unspecified asthma, uncomplicated: Secondary | ICD-10-CM | POA: Diagnosis present

## 2014-03-20 LAB — CBC WITH DIFFERENTIAL/PLATELET
BASOS ABS: 0 10*3/uL (ref 0.0–0.1)
BASOS PCT: 0 % (ref 0–1)
EOS ABS: 0.1 10*3/uL (ref 0.0–0.7)
EOS PCT: 1 % (ref 0–5)
HCT: 38.8 % — ABNORMAL LOW (ref 39.0–52.0)
Hemoglobin: 13.2 g/dL (ref 13.0–17.0)
Lymphocytes Relative: 10 % — ABNORMAL LOW (ref 12–46)
Lymphs Abs: 0.8 10*3/uL (ref 0.7–4.0)
MCH: 30 pg (ref 26.0–34.0)
MCHC: 34 g/dL (ref 30.0–36.0)
MCV: 88.2 fL (ref 78.0–100.0)
MONO ABS: 0.6 10*3/uL (ref 0.1–1.0)
Monocytes Relative: 8 % (ref 3–12)
Neutro Abs: 6.5 10*3/uL (ref 1.7–7.7)
Neutrophils Relative %: 81 % — ABNORMAL HIGH (ref 43–77)
Platelets: 130 10*3/uL — ABNORMAL LOW (ref 150–400)
RBC: 4.4 MIL/uL (ref 4.22–5.81)
RDW: 13.9 % (ref 11.5–15.5)
WBC: 8 10*3/uL (ref 4.0–10.5)

## 2014-03-20 LAB — COMPREHENSIVE METABOLIC PANEL
ALT: 18 U/L (ref 0–53)
ANION GAP: 16 — AB (ref 5–15)
AST: 19 U/L (ref 0–37)
Albumin: 3.5 g/dL (ref 3.5–5.2)
Alkaline Phosphatase: 46 U/L (ref 39–117)
BILIRUBIN TOTAL: 0.3 mg/dL (ref 0.3–1.2)
BUN: 11 mg/dL (ref 6–23)
CO2: 24 meq/L (ref 19–32)
CREATININE: 0.9 mg/dL (ref 0.50–1.35)
Calcium: 8.9 mg/dL (ref 8.4–10.5)
Chloride: 99 mEq/L (ref 96–112)
GFR calc Af Amer: >90 mL/min (ref >90)
GFR, EST NON AFRICAN AMERICAN: 84 mL/min — AB (ref >90)
Glucose, Bld: 152 mg/dL — ABNORMAL HIGH (ref 70–99)
Potassium: 4.1 mEq/L (ref 3.7–5.3)
Sodium: 139 mEq/L (ref 137–147)
Total Protein: 6.2 g/dL (ref 6.0–8.3)

## 2014-03-20 LAB — LIPID PANEL
CHOL/HDL RATIO: 2.9 ratio
Cholesterol: 123 mg/dL (ref 0–200)
HDL: 42 mg/dL (ref >39)
LDL CALC: 65 mg/dL (ref 0–99)
Triglycerides: 78 mg/dL (ref <150)
VLDL: 16 mg/dL (ref 0–40)

## 2014-03-20 LAB — TSH: TSH: 1.38 u[IU]/mL (ref 0.350–4.500)

## 2014-03-20 LAB — HEMOGLOBIN A1C
Hgb A1c MFr Bld: 6.5 % — ABNORMAL HIGH (ref <5.7)
Mean Plasma Glucose: 140 mg/dL — ABNORMAL HIGH (ref <117)

## 2014-03-20 MED ORDER — NITROGLYCERIN 0.4 MG SL SUBL: 0.3000 mg | SUBLINGUAL_TABLET | SUBLINGUAL | Status: DC | PRN

## 2014-03-20 MED ORDER — STROKE: EARLY STAGES OF RECOVERY BOOK
Freq: Once | Status: AC
  Administered 2014-03-20: 11:00:00
  Filled 2014-03-20: qty 1

## 2014-03-20 MED ORDER — ENOXAPARIN SODIUM 40 MG/0.4ML ~~LOC~~ SOLN
40.0000 mg | SUBCUTANEOUS | Status: DC
  Administered 2014-03-20: 40 mg via SUBCUTANEOUS
  Filled 2014-03-20 (×2): qty 0.4

## 2014-03-20 MED ORDER — SENNOSIDES-DOCUSATE SODIUM 8.6-50 MG PO TABS
1.0000 | ORAL_TABLET | Freq: Every evening | ORAL | Status: DC | PRN
  Filled 2014-03-20: qty 1

## 2014-03-20 MED ORDER — METOPROLOL SUCCINATE ER 25 MG PO TB24
25.0000 mg | ORAL_TABLET | Freq: Every day | ORAL | Status: DC
  Administered 2014-03-20 – 2014-03-21 (×2): 25 mg via ORAL
  Filled 2014-03-20 (×2): qty 1

## 2014-03-20 MED ORDER — MELATONIN 10 MG PO CAPS: 10.0000 mg | ORAL_CAPSULE | Freq: Every day | ORAL | Status: DC

## 2014-03-20 MED ORDER — ALBUTEROL SULFATE (2.5 MG/3ML) 0.083% IN NEBU: 3.0000 mL | INHALATION_SOLUTION | Freq: Four times a day (QID) | RESPIRATORY_TRACT | Status: DC | PRN

## 2014-03-20 MED ORDER — OMEGA-3-ACID ETHYL ESTERS 1 G PO CAPS
1.0000 g | ORAL_CAPSULE | Freq: Every day | ORAL | Status: DC
  Administered 2014-03-20 – 2014-03-21 (×2): 1 g via ORAL
  Filled 2014-03-20 (×2): qty 1

## 2014-03-20 MED ORDER — ASPIRIN 300 MG RE SUPP
300.0000 mg | Freq: Every day | RECTAL | Status: DC
  Filled 2014-03-20 (×2): qty 1

## 2014-03-20 MED ORDER — EZETIMIBE 10 MG PO TABS
10.0000 mg | ORAL_TABLET | Freq: Every morning | ORAL | Status: DC
  Administered 2014-03-20 – 2014-03-21 (×2): 10 mg via ORAL
  Filled 2014-03-20 (×2): qty 1

## 2014-03-20 MED ORDER — RANOLAZINE ER 500 MG PO TB12
500.0000 mg | ORAL_TABLET | Freq: Two times a day (BID) | ORAL | Status: DC
  Administered 2014-03-20 – 2014-03-21 (×3): 500 mg via ORAL
  Filled 2014-03-20 (×5): qty 1

## 2014-03-20 MED ORDER — DULOXETINE HCL 60 MG PO CPEP
60.0000 mg | ORAL_CAPSULE | Freq: Every day | ORAL | Status: DC
  Administered 2014-03-20 – 2014-03-21 (×2): 60 mg via ORAL
  Filled 2014-03-20 (×2): qty 1

## 2014-03-20 MED ORDER — ATORVASTATIN CALCIUM 40 MG PO TABS
40.0000 mg | ORAL_TABLET | Freq: Every day | ORAL | Status: DC
  Administered 2014-03-20: 40 mg via ORAL
  Filled 2014-03-20 (×2): qty 1

## 2014-03-20 MED ORDER — ASPIRIN 325 MG PO TABS
325.0000 mg | ORAL_TABLET | Freq: Every day | ORAL | Status: DC
  Administered 2014-03-20: 325 mg via ORAL
  Filled 2014-03-20 (×2): qty 1

## 2014-03-20 NOTE — Progress Notes (Signed)
Patient Demographics  Daniel Singleton, is a 73 y.o. male, DOB - 06-29-1941, IZT:[id number]  Admit date - 03/19/2014   Admitting Physician Rise Patience, MD  Outpatient Primary MD for the patient is [name], MD  LOS - 1   Chief Complaint  Patient presents with  . Emesis        Subjective:   Daniel Singleton today has, No headache, No chest pain, No abdominal pain - No Nausea, No new weakness tingling or numbness, No Cough - SOB, improved vertical  Assessment & Plan    1.Ataxia VS Vertigo - imperative to rule out cerebellar are CVA, could be BPV.  Full stroke workup underway, head CT unremarkable, carotid duplex table, lipid panel normal. Await A1c, MRI brain echogram. On aspirin & anti lipid Rx for secondary prevention. Neuro following. Will have PT OT evaluate as well. No speech issues speech consult formally in place.    2. COPD with emphysema - stable, no shortness of breath or wheezing on exam. Supportive care.    3. History of CAD. Chest pain-free. Continue combination of aspirin, beta blocker, statin for secondary prevention. Also takes Ranexa as needed nitroglycerin.    4. Dyslipidemia. Continue home dose Statin, Zetia and Lovaza.    5.H/O Prostate cancer - no acute issues outpatient followup.    6. Mild nonspecific thrombocytopenia. No acute issues we'll monitor closely as he is on Lovenox. Outpatient followup with PCP.     Code Status: Full  Family Communication: None present  Disposition Plan: Home   Procedures CT head, carotid duplex, MRI brain, echogram   Consults Neuro   Medications  Scheduled Meds: .  stroke: mapping our early stages of recovery book   Does not apply Once  . aspirin  300 mg Rectal Daily   Or  . aspirin  325 mg Oral Daily  .  atorvastatin  40 mg Oral q1800  . DULoxetine  60 mg Oral Daily  . enoxaparin (LOVENOX) injection  40 mg Subcutaneous Q24H  . ezetimibe  10 mg Oral q morning - 10a  . metoprolol succinate  25 mg Oral Daily  . omega-3 acid ethyl esters  1 g Oral Daily  . ranolazine  500 mg Oral BID   Continuous Infusions:  PRN Meds:.albuterol, nitroGLYCERIN, senna-docusate  DVT Prophylaxis  Lovenox    Lab Results  Component Value Date   PLT 130* 03/20/2014    Antibiotics     Anti-infectives   None          Objective:   Filed Vitals:   03/20/14 0145 03/20/14 0213 03/20/14 0400 03/20/14 0802  BP: 120/69 119/80 117/65 130/100  Pulse: 80 87 66 71  Temp:  97.5 F (36.4 C) 98.1 F (36.7 C)   TempSrc:  Oral Oral   Resp: 11 18 18    Height:  5\' 11"  (1.803 m)    Weight:  92.8 kg (204 lb 9.4 oz)    SpO2: 91% 99% 97% 98%    Wt Readings from Last 3 Encounters:  03/20/14 92.8 kg (204 lb 9.4 oz)  12/18/13 92.352 kg (203 lb 9.6 oz)  11/20/13 91.808 kg (202 lb 6.4 oz)     Intake/Output Summary (Last 24 hours) at 03/20/14 1011 Last data  filed at 03/19/14 2237  Gross per 24 hour  Intake   1000 ml  Output    400 ml  Net    600 ml     Physical Exam  Awake Alert, Oriented X 3, No new F.N deficits, Normal affect [place].AT,PERRAL Supple Neck,No JVD, No cervical lymphadenopathy appriciated.  Symmetrical Chest wall movement, Good air movement bilaterally, CTAB RRR,No Gallops,Rubs or new Murmurs, No Parasternal Heave +ve B.Sounds, Abd Soft, No tenderness, No organomegaly appriciated, No rebound - guarding or rigidity. No Cyanosis, Clubbing or edema, No new Rash or bruise      Data Review   Micro Results No results found for this or any previous visit (from the past 240 hour(s)).  Radiology Reports   Dg Chest 2 View  03/19/2014   CLINICAL DATA:  Cervical and vomiting today.  EXAM: CHEST  2 VIEW  COMPARISON:  Chest x-rays dated 08/15/2013 and 11/23/2012  FINDINGS: Heart size and pulmonary  vascularity are normal. There are no acute infiltrates or effusions. There is slight scarring in the lingula. No acute osseous abnormality. Old compression deformity of what appears to be L1.  IMPRESSION: No acute abnormalities.   Electronically Signed   By: Rozetta Nunnery M.D.   On: 03/19/2014 20:24   Ct Head Wo Contrast  03/20/2014   CLINICAL DATA:  Vomiting and ataxia. Acute loss of vision in both eyes which has since returned. Dizziness.  EXAM: CT HEAD WITHOUT CONTRAST  TECHNIQUE: Contiguous axial images were obtained from the base of the skull through the vertex without intravenous contrast.  COMPARISON:  None.  FINDINGS: Mild diffuse cerebral atrophy. No ventricular dilatation. No mass effect or midline shift. No abnormal extra-axial fluid collections. Gray-white matter junctions are distinct. Basal cisterns are not effaced. No evidence of acute intracranial hemorrhage. No depressed skull fractures. Visualized paranasal sinuses and mastoid air cells are not opacified. Vascular calcifications.  IMPRESSION: No acute intracranial abnormalities. Chronic atrophy and small vessel ischemic changes.   Electronically Signed   By: Lucienne Capers M.D.   On: 03/20/2014 00:17    CBC  Recent Labs Lab 03/19/14 1910 03/20/14 0438  WBC 8.4 8.0  HGB 13.1 13.2  HCT 37.5* 38.8*  PLT PLATELET CLUMPS NOTED ON SMEAR, COUNT APPEARS DECREASED 130*  MCV 89.1 88.2  MCH 31.1 30.0  MCHC 34.9 34.0  RDW 13.8 13.9  LYMPHSABS  --  0.8  MONOABS  --  0.6  EOSABS  --  0.1  BASOSABS  --  0.0    Chemistries   Recent Labs Lab 03/19/14 1910 03/20/14 0438  NA 138 139  K 4.2 4.1  CL 98 99  CO2 28 24  GLUCOSE 173* 152*  BUN 12 11  CREATININE 1.01 0.90  CALCIUM 8.9 8.9  AST  --  19  ALT  --  18  ALKPHOS  --  46  BILITOT  --  0.3   ------------------------------------------------------------------------------------------------------------------ estimated creatinine clearance is 87.6 ml/min (by C-G formula  based on Cr of 0.9). ------------------------------------------------------------------------------------------------------------------ No results found for this basename: HGBA1C,  in the last 72 hours ------------------------------------------------------------------------------------------------------------------  Recent Labs  03/20/14 0435  CHOL 123  HDL 42  LDLCALC 65  TRIG 78  CHOLHDL 2.9   ------------------------------------------------------------------------------------------------------------------  Recent Labs  03/20/14 0438  TSH 1.380   ------------------------------------------------------------------------------------------------------------------ No results found for this basename: VITAMINB12, FOLATE, FERRITIN, TIBC, IRON, RETICCTPCT,  in the last 72 hours  Coagulation profile No results found for this basename: INR, PROTIME,  in the last  168 hours  No results found for this basename: DDIMER,  in the last 72 hours  Cardiac Enzymes No results found for this basename: CK, CKMB, TROPONINI, MYOGLOBIN,  in the last 168 hours ------------------------------------------------------------------------------------------------------------------ No components found with this basename: POCBNP,      Time Spent in minutes 35   SINGH,PRASHANT K M.D on 03/20/2014 at 10:11 AM  Between 7am to 7pm - Pager - [phone]  After 7pm go to [email or web address] - password [email or web address]  And look for the night coverage person covering for me after hours  [facility] Office  [phone]   **Disclaimer: This note may have been dictated with voice recognition software. Similar sounding words can inadvertently be transcribed and this note may contain transcription errors which may not have been corrected upon publication of note.**

## 2014-03-20 NOTE — Progress Notes (Signed)
Stroke Team Progress Note  SUBJECTIVE His wife and son Daniel Singleton are at the bedside. He presented with vertigo, gait ataxia, nausea, blurred vision most of his symptoms have resolved though he still feels dizzy and off balance when he sits up. He has not yet been evaluated by physical therapy. Overall he feels his condition is stable. Daughter is a Daniel Singleton out of state. The family want to run treatment options by him.  OBJECTIVE Most recent Vital Signs: Filed Vitals:   03/20/14 0400 03/20/14 0800 03/20/14 0802 03/20/14 1041  BP: 117/65 130/100 130/100 138/76  Pulse: 66  71 73  Temp: 98.1 F (36.7 C)     TempSrc: Oral     Resp: 18     Height:      Weight:      SpO2: 97%  98% 94%   IV Fluid Intake:     MEDICATIONS  . aspirin  300 mg Rectal Daily   Or  . aspirin  325 mg Oral Daily  . atorvastatin  40 mg Oral q1800  . DULoxetine  60 mg Oral Daily  . enoxaparin (LOVENOX) injection  40 mg Subcutaneous Q24H  . ezetimibe  10 mg Oral q morning - 10a  . metoprolol succinate  25 mg Oral Daily  . omega-3 acid ethyl esters  1 g Oral Daily  . ranolazine  500 mg Oral BID   PRN:  albuterol, nitroGLYCERIN, senna-docusate  Diet:  Carb Control thin liquids Activity:  OOB with assistance DVT Prophylaxis:  Lovenox 40 mg sq daily   CLINICALLY SIGNIFICANT STUDIES Basic Metabolic Panel:  Recent Labs Lab 03/19/14 1910 03/20/14 0438  NA 138 139  K 4.2 4.1  CL 98 99  CO2 28 24  GLUCOSE 173* 152*  BUN 12 11  CREATININE 1.01 0.90  CALCIUM 8.9 8.9   Liver Function Tests:  Recent Labs Lab 03/20/14 0438  AST 19  Singleton 18  ALKPHOS 46  BILITOT 0.3  PROT 6.2  ALBUMIN 3.5   CBC:  Recent Labs Lab 03/19/14 1910 03/20/14 0438  WBC 8.4 8.0  NEUTROABS  --  6.5  HGB 13.1 13.2  HCT 37.5* 38.8*  MCV 89.1 88.2  PLT PLATELET CLUMPS NOTED ON SMEAR, COUNT APPEARS DECREASED 130*   Coagulation: No results found for this basename: LABPROT, INR,  in the last 168 hours Cardiac Enzymes: No results found for  this basename: CKTOTAL, CKMB, CKMBINDEX, TROPONINI,  in the last 168 hours Urinalysis: No results found for this basename: COLORURINE, APPERANCEUR, LABSPEC, PHURINE, GLUCOSEU, HGBUR, BILIRUBINUR, KETONESUR, PROTEINUR, UROBILINOGEN, NITRITE, LEUKOCYTESUR,  in the last 168 hours Lipid Panel    Component Value Date/Time   CHOL 123 03/20/2014 0435   TRIG 78 03/20/2014 0435   TRIG 96 08/01/2013 0952   HDL 42 03/20/2014 0435   CHOLHDL 2.9 03/20/2014 0435   VLDL 16 03/20/2014 0435   LDLCALC 65 03/20/2014 0435   LDLCALC 53 08/01/2013 0952   HgbA1C  Lab Results  Component Value Date   HGBA1C 6.7* 08/15/2013    Urine Drug Screen:   No results found for this basename: labopia, cocainscrnur, labbenz, amphetmu, thcu, labbarb    Alcohol Level: No results found for this basename: ETH,  in the last 168 hours  CT of the brain  03/20/2014    No acute intracranial abnormalities. Chronic atrophy and small vessel ischemic changes.  MRI of the brain    MRA of the brain    Carotid Doppler  EF 45-50% with no source of  embolus.   2D Echocardiogram  EF 55-60% with no source of embolus.   CXR  03/19/2014   No acute abnormalities.    EKG  normal EKG, normal sinus rhythm. For complete results please see formal report.   Therapy Recommendations    Physical Exam   Pleasant elderly obese Caucasian male not in distress.Awake alert. Afebrile. Head is nontraumatic. Neck is supple without bruit. Hearing is normal. Cardiac exam no murmur or gallop. Lungs are clear to auscultation. Distal pulses are well felt. Neurological Exam ;  Awake  Alert oriented x 3. Normal speech and language.eye movements full without nystagmus.fundi were not visualized. Vision acuity and fields appear normal. Hearing is normal. Palatal movements are normal. Face symmetric. Tongue midline. Normal strength, tone, reflexes and coordination. Normal sensation. Gait deferred. ASSESSMENT Daniel Singleton is a 73 y.o. male presenting with  vertigo with nausea, vomiting, double vision, numbness and weakness. He was not a t-PA candidate secondary to mild symptoms. Imaging pending; suspect a posterior circulation TIA. Patient is at risk for subsequent stroke.  On aspirin 81 mg orally every day prior to admission. Now on aspirin 325 mg orally every day for secondary stroke prevention. Patient with resultant vertigo. Stroke work up underway.  Hypertension, BP 116-130/73-84 past 24h,  Diabetes, HgbA1c pending, goal < 7.0 Hyperlipidemia, LDL 65, on crestor 20 mg, lovaza & zetia daily PTA, formulary replacement lipitor 40 mg & zetia in hospital, at goal LDL < 70 for diabetics Hx CAD obstructive sleep apnea, CPAP at home Mild nonspecific thrombocytopenia.  Hospital day # 1  TREATMENT/PLAN  Continue aspirin 325 mg orally every day for secondary stroke prevention.  F/u MRI, MRA  OOB, therapy evals Patient interested in considering [employer] research study for secondary stroke prevention. Daniel Singleton spoke with patient and family. [facility] will follow up.  Daniel Sabin, MSN, RN, ANVP-BC, ANP-BC, [facility] Pager: [phone] 03/20/2014 11:01 AM  I have personally examined this patient, reviewed notes, independently viewed imaging studies, participated in medical decision making and plan of care. I have made any additions or clarifications directly to the above note. Agree with note above. . I. think he has had a posterior circulation TIA. He has significant risk of 10-15% of having another stroke or TIA over the next one year with most of the risk being upfront over the first one week and few months. He may consider possible participation in the [employer] trial if interested. I spent 45 minutes with this patient direct face to face as well as discussion of stroke risk, personally reviewed imaging studies, lab results and went over his stroke risk factor stratification with greater than 50% of  the time being spent on counseling  Daniel Singleton, [place] Pager: [phone] 03/20/2014 4:57 PM  SIGNED    To contact Stroke Continuity provider, please refer to [email or web address] After hours, contact General Neurology

## 2014-03-20 NOTE — Progress Notes (Signed)
PHARMACIST - PHYSICIAN ORDER COMMUNICATION  CONCERNING: P&T Medication Policy on Herbal Medications  DESCRIPTION:  This patient's order for:  Melatonin 10mg  caps has been noted.  This product(s) is classified as an "herbal" or natural product. Due to a lack of definitive safety studies or FDA approval, nonstandard manufacturing practices, plus the potential risk of unknown drug-drug interactions while on inpatient medications, the Pharmacy and Therapeutics Committee does not permit the use of "herbal" or natural products of this type within [facility].   ACTION TAKEN: The pharmacy department is unable to verify this order at this time and your patient has been informed of this safety policy. Please reevaluate patient's clinical condition at discharge and address if the herbal or natural product(s) should be resumed at that time.

## 2014-03-20 NOTE — Progress Notes (Signed)
UR Completed Frazier Balfour Graves-Bigelow, RN,BSN [phone]  

## 2014-03-20 NOTE — Consult Note (Signed)
Neurology Consultation Reason for Consult: Vertigo Referring Physician: Hal Hope, A  CC: Vertigo  History is obtained from: Patient, family  HPI: Daniel Singleton is a 73 y.o. male with a history of hypertension, heart disease who presents with vertigo started this evening. He states that he is eating dinner when all of a sudden, the room began to spin around him. He had trouble walking to the bathroom to throw up and his wife had to help him. He therefore sought further care in the emergency department.  He has had nausea and vomiting, denies hiccups, double vision, numbness, weakness.   Here, he was given Antivert without improvement. He was noticed that he had nystagmus and multiple directions of gaze and therefore posterior circulation infarct was suspected and neurology was consulted.   LKW: 5 PM tpa given?: no, mild symptoms    ROS: A 14 point ROS was performed and is negative except as noted in the HPI.   Past Medical History  Diagnosis Date  . Hypertension   . Emphysema   . Diabetes   . Hypercholesteremia   . PONV (postoperative nausea and vomiting)   . Angina at rest   . Coronary artery disease     recently started seeing Dr. Claiborne Billings   . Arthritis   . GERD (gastroesophageal reflux disease)   . Elevated PSA   . Sleep apnea     c pap with oxygen at 2.5 L  . Prostate cancer 05/23/13    gleason 3+4=7, volume 31 cc  . Asthma   . Depression   . Anxiety   . Hx of radiation therapy 09/20/13- 11/14/13    prostate 7800 cGy 40 sessions, seminal vesicles 5600 cGy in 40 sessions    Family History: Heart disease-mother  Social History: Tob: Previous smoker  Exam: Current vital signs: BP 147/79  Pulse 65  Temp(Src) 97.5 F (36.4 C)  Resp 10  SpO2 97% Vital signs in last 24 hours: Temp:  [97.5 F (36.4 C)] 97.5 F (36.4 C) (07/28 2333) Pulse Rate:  [62-81] 65 (07/29 0032) Resp:  [9-16] 10 (07/29 0032) BP: (116-151)/(69-86) 147/79 mmHg (07/29 0001) SpO2:  [96  %-100 %] 97 % (07/29 0032)  General: In bed, NAD CV: Regular rate and rhythm Mental Status: Patient is awake, alert, oriented to person, place, month, year, and situation. Immediate and remote memory are intact. Patient is able to give a clear and coherent history. No signs of aphasia or neglect Cranial Nerves: II: Visual Fields are full. Pupils are equal, round, and reactive to light.  Discs are difficult to visualize. III,IV, VI: EOMI no he has nystagmus worse with leftward gaze, but present in multiple directions. At times it appears left beating and at times rotary.  V: Facial sensation is symmetric to temperature VII: Facial movement is symmetric.  VIII: hearing is intact to voice X: Uvula elevates symmetrically XI: Shoulder shrug is symmetric. XII: tongue is midline without atrophy or fasciculations.  Motor: Tone is normal. Bulk is normal. 5/5 strength was present in all four extremities.  Sensory: Sensation is symmetric to light touch and temperature in the arms and legs. With the exception over her scar from previous vein harvesting on the right leg Deep Tendon Reflexes: 2+ and symmetric in the biceps and patellae.  Cerebellar: HKS  intact bilaterally. He is slightly slower and more clumsy with his right hand than his left despite being right-handed.  Gait: not tested secondary to patient safety concerns.  I have reviewed labs in epic and the results pertinent to this consultation are: BMP-unremarkable   I have reviewed the images obtained: CT head-negative   Impression:  73 year old male with acute onset of vertigo. I do suspect small cerebellar infarct and he'll need to be admitted and worked up for such.   Recommendations: 1. HgbA1c, fasting lipid panel 2. MRI, MRA  of the brain without contrast 3. Frequent neuro checks 4. Echocardiogram 5. Carotid dopplers 6. Prophylactic therapy-Antiplatelet med: Aspirin - dose 325mg  PO or 300mg  PR 7. Risk factor  modification 8. Telemetry monitoring 9. PT consult, OT consult, Speech consult    Roland Rack, MD [facility] [phone]  If 7pm- 7am, please page neurology on call as listed in [place].

## 2014-03-20 NOTE — Progress Notes (Signed)
Pt is refusing to wear CPAP tonight. Stated that he does not wear one at home. RT explained importance of CPAP and pt continued to refuse. PT understands to contact RT if he changes his mind

## 2014-03-20 NOTE — Progress Notes (Signed)
Bilateral carotid artery duplex completed:  1-39% ICA stenosis.  Vertebral artery flow is antegrade.     

## 2014-03-20 NOTE — ED Notes (Signed)
Pt st's he feels less dizzy after taking Antivert.  Pt remains alert and oriented x's 3, skin warm and dry, color appropriate.

## 2014-03-20 NOTE — Telephone Encounter (Signed)
Returned a call to patient's son to find out what type of "study". Also to inform Him that Dr. Claiborne Billings is on vacation this week and will not be able to give his opinion before next week.Marland Kitchen He then informs me that he was not told that Dr. Claiborne Billings is out. He would have needed to know before 5:00 today. He then informs me that he and his sister, who is a Therapist, sports decided not to enroll in the study. He then thanked me for returning the call.

## 2014-03-20 NOTE — H&P (Signed)
[facility] History and Physical  Daniel Singleton JKD:[id number] DOB: 17-Jul-1941 DOA: 03/19/2014  Referring physician: ER physician. PCP: [name], MD   Chief Complaint: Nausea vomiting and dizziness.  HPI: Daniel Singleton is a 73 y.o. male with history of CAD status post CABG, hypertension, COPD, hyperlipidemia, OSA, prostate cancer was brought to the ER after patient was having complaints of nausea vomiting and dizziness. As per the patient around 5 PM last evening while having supper patient started having nausea followed by and vomiting and had difficulty walking due to balance issues. Patient was brought to the ER. In the ER patient was found to have nystagmus and difficulty ambulating. Since patient's symptoms were persistent despite giving meclizine CT of the head was done which did not show anything acute and neurologist on-call was consulted and patient has been admitted for possible CVA. On my exam patient still feels dizzy and difficulty to move due to dizziness. Denies any difficulty speaking swallowing. Denies any headache visual symptoms or any difficulty hearing or tinnitus. Patient is able to move all extremities 5 x 5. No facial asymmetry. Patient did not have any fever chills or any new medications recently added.   Review of Systems: As presented in the history of presenting illness, rest negative.  Past Medical History  Diagnosis Date  . Hypertension   . Emphysema   . Diabetes   . Hypercholesteremia   . PONV (postoperative nausea and vomiting)   . Angina at rest   . Coronary artery disease     recently started seeing Dr. Claiborne Billings   . Arthritis   . GERD (gastroesophageal reflux disease)   . Elevated PSA   . Sleep apnea     c pap with oxygen at 2.5 L  . Prostate cancer 05/23/13    gleason 3+4=7, volume 31 cc  . Asthma   . Depression   . Anxiety   . Hx of radiation therapy 09/20/13- 11/14/13    prostate 7800 cGy 40 sessions, seminal vesicles 5600 cGy in 40  sessions   Past Surgical History  Procedure Laterality Date  . Heart bypass  2006    [facility]  . Degloving rt leg injury  2000    had skin grafts  . Hernia repair  2008    x 3 w/mesh, bilat inguinla, umbilical  . Back surgery  2006    discectomy lumbar  . Tonsillectomy      w/adenoidectomy  . Prostate biopsy N/A 05/23/2013    Procedure: BIOPSY TRANSRECTAL ULTRASONIC PROSTATE (TUBP);  Surgeon: Molli Hazard, MD;  Location: [facility];  Service: Urology;  Laterality: N/A;  prostate nerve block  . Back surgery    . Foot surgery    . Back surgery     Social History:  reports that he quit smoking about 15 years ago. His smoking use included Cigarettes. He has a 40 pack-year smoking history. He has never used smokeless tobacco. He reports that he drinks alcohol. He reports that he does not use illicit drugs. Where does patient live home. Can patient participate in ADLs? Yes.  No Known Allergies  Family History:  Family History  Problem Relation Age of Onset  . Heart disease Mother   . Heart attack Mother   . Cancer Sister   . Depression Sister       Prior to Admission medications   Medication Sig Start Date End Date Taking? Authorizing Provider  albuterol (PROVENTIL HFA;VENTOLIN HFA) 108 (90 BASE) MCG/ACT inhaler Inhale 2  puffs into the lungs every 6 (six) hours as needed for wheezing. 05/28/13  Yes Deneise Lever, MD  aspirin 81 MG tablet Take 1 tablet (81 mg total) by mouth daily. 08/21/13  Yes [name], MD  DULoxetine (CYMBALTA) 60 MG capsule Take 60 mg by mouth daily.   Yes Historical Provider, MD  ezetimibe (ZETIA) 10 MG tablet Take 1 tablet (10 mg total) by mouth every morning. 01/16/14  Yes Troy Sine, MD  Melatonin 10 MG CAPS Take 10 mg by mouth daily.   Yes Historical Provider, MD  metoprolol succinate (TOPROL XL) 25 MG 24 hr tablet Take 1 tablet (25 mg total) by mouth daily. 03/07/14  Yes Troy Sine, MD  nitroGLYCERIN (NITROSTAT) 0.3 MG SL tablet Place 0.3 mg  under the tongue every 5 (five) minutes as needed for chest pain.   Yes Historical Provider, MD  omega-3 acid ethyl esters (LOVAZA) 1 G capsule Take 1 capsule (1 g total) by mouth daily. 01/16/14  Yes Troy Sine, MD  ranolazine (RANEXA) 500 MG 12 hr tablet Take 1 tablet (500 mg total) by mouth 2 (two) times daily. 01/16/14  Yes Troy Sine, MD  rosuvastatin (CRESTOR) 20 MG tablet Take 1 tablet (20 mg total) by mouth daily. 03/07/14  Yes Troy Sine, MD    Physical Exam: Filed Vitals:   03/19/14 2231 03/19/14 2333 03/20/14 0001 03/20/14 0032  BP: 147/86  147/79   Pulse: 71   65  Temp:  97.5 F (36.4 C)    Resp: 12   10  SpO2: 99%   97%     General:  Well-developed well-nourished.  Eyes: Anicteric no pallor.  ENT: No discharge from the ears eyes nose mouth. Has nystagmus both horizontal and vertical.  Neck: No mass found. No neck rigidity.  Cardiovascular: S1-S2 heard.  Respiratory: No rhonchi or crepitations.  Abdomen: Soft nontender bowel sounds present. No guarding or rigidity.  Skin: No rash.  Musculoskeletal: No edema.  Psychiatric: Appears normal.  Neurologic: Alert awake oriented to time place and person. Moves all extremities 5 x 5. Nystagmus was seen. Perla positive. No facial asymmetry. Tongue is midline.  Labs on Admission:  Basic Metabolic Panel:  Recent Labs Lab 03/19/14 1910  NA 138  K 4.2  CL 98  CO2 28  GLUCOSE 173*  BUN 12  CREATININE 1.01  CALCIUM 8.9   Liver Function Tests: No results found for this basename: AST, ALT, ALKPHOS, BILITOT, PROT, ALBUMIN,  in the last 168 hours No results found for this basename: LIPASE, AMYLASE,  in the last 168 hours No results found for this basename: AMMONIA,  in the last 168 hours CBC:  Recent Labs Lab 03/19/14 1910  WBC 8.4  HGB 13.1  HCT 37.5*  MCV 89.1  PLT PLATELET CLUMPS NOTED ON SMEAR, COUNT APPEARS DECREASED   Cardiac Enzymes: No results found for this basename: CKTOTAL, CKMB,  CKMBINDEX, TROPONINI,  in the last 168 hours  BNP (last 3 results) No results found for this basename: PROBNP,  in the last 8760 hours CBG: No results found for this basename: GLUCAP,  in the last 168 hours  Radiological Exams on Admission: Dg Chest 2 View  03/19/2014   CLINICAL DATA:  Cervical and vomiting today.  EXAM: CHEST  2 VIEW  COMPARISON:  Chest x-rays dated 08/15/2013 and 11/23/2012  FINDINGS: Heart size and pulmonary vascularity are normal. There are no acute infiltrates or effusions. There is slight scarring in the  lingula. No acute osseous abnormality. Old compression deformity of what appears to be L1.  IMPRESSION: No acute abnormalities.   Electronically Signed   By: Rozetta Nunnery M.D.   On: 03/19/2014 20:24   Ct Head Wo Contrast  03/20/2014   CLINICAL DATA:  Vomiting and ataxia. Acute loss of vision in both eyes which has since returned. Dizziness.  EXAM: CT HEAD WITHOUT CONTRAST  TECHNIQUE: Contiguous axial images were obtained from the base of the skull through the vertex without intravenous contrast.  COMPARISON:  None.  FINDINGS: Mild diffuse cerebral atrophy. No ventricular dilatation. No mass effect or midline shift. No abnormal extra-axial fluid collections. Gray-white matter junctions are distinct. Basal cisterns are not effaced. No evidence of acute intracranial hemorrhage. No depressed skull fractures. Visualized paranasal sinuses and mastoid air cells are not opacified. Vascular calcifications.  IMPRESSION: No acute intracranial abnormalities. Chronic atrophy and small vessel ischemic changes.   Electronically Signed   By: Lucienne Capers M.D.   On: 03/20/2014 00:17    EKG: Independently reviewed. Normal sinus rhythm.  Assessment/Plan Principal Problem:   Ataxia Active Problems:   COPD with emphysema   CAD (coronary artery disease) s/p CABG x 4 in 2006   Prostate cancer   1. Ataxia with dizziness and nausea vomiting - concerning for stroke. Patient has been placed  on neurochecks swallowing evaluation and MRI/MRA brain 2-D echo and carotid Doppler has been ordered. Aspirin. Neurologist on-call Dr. Leonel Ramsay has been consulted and I did discuss with Dr. Leonel Ramsay. 2. CAD status post CABG - denies any chest pain. Continue present medications. 3. Diabetes mellitus 2 - presently off medications and diet. Patient has been placed on sliding-scale coverage. 4. Hypertension - continue present medications. 5. Hyperlipidemia - on statins. 6. COPD - presently not wheezing. 7. OSA - CPAP at bedtime. 8. History of prostate cancer. 9. History of admission for small bowel obstruction last December.    Code Status: Full code.  Family Communication: Patient's wife at the bedside.  Disposition Plan: Admit to inpatient.    Sanna Porcaro N. [facility] Pager [phone].  If 7PM-7AM, please contact night-coverage [email or web address] Password [email or web address] 03/20/2014, 1:01 AM

## 2014-03-20 NOTE — Progress Notes (Signed)
Echocardiogram 2D Echocardiogram has been performed.  03/20/2014 10:16 AM Maudry Mayhew, RVT, RDCS, RDMS

## 2014-03-20 NOTE — Care Management Note (Signed)
    Page 1 of 1   03/21/2014     10:43:46 AM CARE MANAGEMENT NOTE 03/21/2014  Patient:  Daniel Singleton, Daniel Singleton   Account Number:  [id number]  Date Initiated:  03/20/2014  Documentation initiated by:  GRAVES-BIGELOW,Yoland Scherr  Subjective/Objective Assessment:   Pt admitted for Nausea vomiting and dizziness-ataxia. Neurology consulting.     Action/Plan:   Pt is from home with family support. CM will continue to monitor for disposition needs.   Anticipated DC Date:  03/22/2014   Anticipated DC Plan:  [place]  CM consult      [facility] Choice  HOME HEALTH   Choice offered to / List presented to:  C-1 Patient        [place] arranged  [place] [facility]      [place].   Status of service:  Completed, signed off Medicare Important Message given?  YES (If response is "NO", the following Medicare IM given date fields will be blank) Date Medicare IM given:  03/21/2014 Medicare IM given by:  GRAVES-BIGELOW,Avelardo Reesman Date Additional Medicare IM given:   Additional Medicare IM given by:    Discharge Disposition:  [place]  Per UR Regulation:  Reviewed for med. necessity/level of care/duration of stay  If discussed at [place] of Stay Meetings, dates discussed:    Comments:  1041 03-21-14 Jacqlyn Krauss, RN,BSN [phone] CM ofered choice for [facility] services and pt chose [facility]. CM did make referral and SOC to begin within 24-48 hrs post d/c. No further needs from CM at this time.

## 2014-03-20 NOTE — Telephone Encounter (Signed)
Son is calling regarding a drug study that is being recommended for his father.  Wanted to get Dr. Evette Georges opinion.

## 2014-03-21 DIAGNOSIS — G450 Vertebro-basilar artery syndrome: Secondary | ICD-10-CM | POA: Diagnosis present

## 2014-03-21 MED ORDER — MECLIZINE HCL 25 MG PO TABS: 25.0000 mg | ORAL_TABLET | Freq: Three times a day (TID) | ORAL | Status: DC | PRN

## 2014-03-21 MED ORDER — ASPIRIN EC 325 MG PO TBEC: 325.0000 mg | DELAYED_RELEASE_TABLET | Freq: Every day | ORAL | Status: DC

## 2014-03-21 MED ORDER — ASPIRIN 325 MG PO TABS: 325.0000 mg | ORAL_TABLET | Freq: Every day | ORAL | Status: DC

## 2014-03-21 MED ORDER — CLOPIDOGREL BISULFATE 75 MG PO TABS: 75.0000 mg | ORAL_TABLET | Freq: Every day | ORAL | Status: DC

## 2014-03-21 MED ORDER — CLOPIDOGREL BISULFATE 75 MG PO TABS
75.0000 mg | ORAL_TABLET | Freq: Every day | ORAL | Status: DC
  Administered 2014-03-21: 75 mg via ORAL
  Filled 2014-03-21: qty 1

## 2014-03-21 NOTE — Evaluation (Signed)
Occupational Therapy Evaluation Patient Details Name: Daniel Singleton MRN: [id number] DOB: 05-Jun-1941 Today's Date: 03/21/2014    History of Present Illness Pt admitted with nausea and vomiting with increased dizziness with difficulty ambulating.   Clinical Impression   Pt Mod I - independent with ADLs and ADL mobility with Fair standing balance. All education completed and no further acute OT services are indicated at this time.     Follow Up Recommendations  No OT follow up    Equipment Recommendations  None recommended by OT    Recommendations for Other Services       Precautions / Restrictions Precautions Precautions: None Restrictions Weight Bearing Restrictions: No      Mobility Bed Mobility Overal bed mobility: Independent                Transfers Overall transfer level: Independent                    Balance Overall balance assessment: Needs assistance Sitting-balance support: No upper extremity supported;Feet supported Sitting balance-Leahy Scale: Good     Standing balance support: Single extremity supported;No upper extremity supported;During functional activity Standing balance-Leahy Scale: Fair                              ADL Overall ADL's : Modified independent                                             Vision  wears glasses at all times  minimal blurred vision, resloved             Additional Comments: blurred vision minimal per pt   Perception Perception Perception Tested?: No   Praxis Praxis Praxis tested?: Not tested    Pertinent Vitals/Pain No c/o pani, VSS     Hand Dominance Right   Extremity/Trunk Assessment Upper Extremity Assessment Upper Extremity Assessment: Overall WFL for tasks assessed   Lower Extremity Assessment Lower Extremity Assessment: Defer to PT evaluation   Cervical / Trunk Assessment Cervical / Trunk Assessment: Normal   Communication  Communication Communication: No difficulties   Cognition Arousal/Alertness: Awake/alert Behavior During Therapy: WFL for tasks assessed/performed Overall Cognitive Status: Within Functional Limits for tasks assessed                     General Comments   Pt very pleasant and cooperative                 Home Living Family/patient expects to be discharged to:: Private residence Living Arrangements: Spouse/significant other Available Help at Discharge: Family Type of Home: House Home Access: Stairs to enter Technical brewer of Steps: 5 Entrance Stairs-Rails: Right Home Layout: One level     Bathroom Shower/Tub: Occupational psychologist: Handicapped height     Home Equipment: Cane - single point;Walker - 4 wheels;Walker - 2 wheels;Bedside commode;Shower seat   Additional Comments: oxygen at night      Prior Functioning/Environment Level of Independence: Independent                             OT Goals(Current goals can be found in the care plan section) Acute Rehab OT Goals Patient Stated Goal: go home OT Goal Formulation: With patient/family  OT  Frequency:     Barriers to D/C:  none                        End of Session    Activity Tolerance: Patient tolerated treatment well Patient left: in bed;Other (comment) (sitting EOB)   Time: 5183-3582 OT Time Calculation (min): 19 min Charges:  OT General Charges $OT Visit: 1 Procedure OT Evaluation $Initial OT Evaluation Tier I: 1 Procedure OT Treatments $Therapeutic Activity: 8-22 mins G-Codes:    Daniel Singleton 03/21/2014, 1:25 PM

## 2014-03-21 NOTE — Discharge Summary (Addendum)
Daniel Singleton, is a 73 y.o. male  DOB June 15, 1941  MRN [id number].  Admission date:  03/19/2014  Admitting Physician  Rise Patience, MD  Discharge Date:  03/21/2014   Primary MD  [name], MD  Recommendations for primary care physician for things to follow:   Monitor secondary factors for stroke/TIA, monitor vertigo symptoms  Monitor CBC for thrombocytopenia    Admission Diagnosis  Ataxia [781.3] Prostate ca [185] Hyperlipidemia with target LDL less than 70 [272.4] Nausea and vomiting, vomiting of unspecified type [787.01]   Discharge Diagnosis  Ataxia [781.3] Prostate ca [185] Hyperlipidemia with target LDL less than 70 [272.4] Nausea and vomiting, vomiting of unspecified type [787.01]    Principal Problem:   Ataxia Active Problems:   COPD with emphysema   CAD (coronary artery disease) s/p CABG x 4 in 2006   Prostate cancer   posterior circulation TIA      Past Medical History  Diagnosis Date  . Hypertension   . Emphysema   . Diabetes   . Hypercholesteremia   . PONV (postoperative nausea and vomiting)   . Angina at rest   . Coronary artery disease     recently started seeing Dr. Claiborne Billings   . Arthritis   . GERD (gastroesophageal reflux disease)   . Elevated PSA   . Sleep apnea     c pap with oxygen at 2.5 L  . Prostate cancer 05/23/13    gleason 3+4=7, volume 31 cc  . Asthma   . Depression   . Anxiety   . Hx of radiation therapy 09/20/13- 11/14/13    prostate 7800 cGy 40 sessions, seminal vesicles 5600 cGy in 40 sessions    Past Surgical History  Procedure Laterality Date  . Heart bypass  2006    Quad  . Degloving rt leg injury  2000    had skin grafts  . Hernia repair  2008    x 3 w/mesh, bilat inguinla, umbilical  . Back surgery  2006    discectomy lumbar  . Tonsillectomy     w/adenoidectomy  . Prostate biopsy N/A 05/23/2013    Procedure: BIOPSY TRANSRECTAL ULTRASONIC PROSTATE (TUBP);  Surgeon: Molli Hazard, MD;  Location: [facility];  Service: Urology;  Laterality: N/A;  prostate nerve block  . Back surgery    . Foot surgery    . Back surgery         History of present illness and  Hospital Course:     Kindly see H&P for history of present illness and admission details, please review complete Labs, Consult reports and Test reports for all details in brief  HPI  from the history and physical done on the day of admission  Daniel Singleton is a 73 y.o. male with history of CAD status post CABG, hypertension, COPD, hyperlipidemia, OSA, prostate cancer was brought to the ER after patient was having complaints of nausea vomiting and dizziness. As per the patient around 5 PM last evening while having supper patient started  having nausea followed by and vomiting and had difficulty walking due to balance issues. Patient was brought to the ER. In the ER patient was found to have nystagmus and difficulty ambulating. Since patient's symptoms were persistent despite giving meclizine CT of the head was done which did not show anything acute and neurologist on-call was consulted and patient has been admitted for possible CVA. On my exam patient still feels dizzy and difficulty to move due to dizziness. Denies any difficulty speaking swallowing. Denies any headache visual symptoms or any difficulty hearing or tinnitus. Patient is able to move all extremities 5 x 5. No facial asymmetry. Patient did not have any fever chills or any new medications recently added.     Hospital Course    1.Ataxia VS Vertigo - ruled out posterior circulation CVA, could be BPV or posterior secretion TIA   Full stroke workup was done, head CT and MRI were unremarkable, carotid duplex stable, lipid panel normal. Normal A1c, normal echogram. Per neuro switch to Full dose ASA (Dr Leonie Man) informed PCP  office as well has had plavix allergy in the past, called pharmacy too, continue home dose anti lipid Rx for secondary prevention. Neuro seen the patient patient will follow with neurology in the outpatient setting, still has some vertigo for which I will place him on when necessary meclizine, he will use walker at home which he already has, we'll have PT following.   2. COPD with emphysema - stable, no shortness of breath or wheezing on exam. Supportive care. Uses home oxygen.   3. History of CAD. Chest pain-free. Continue combination of  ASA, beta blocker, statin for secondary prevention. Also takes Ranexa as needed nitroglycerin.    4. Dyslipidemia. Continue home dose Statin, Zetia and Lovaza.    5.H/O Prostate cancer - no acute issues outpatient followup.    6. Mild nonspecific thrombocytopenia. No acute issues we'll monitor closely as he is on Lovenox. Outpatient followup with PCP.    Discharge Condition: stable   Follow UP  Follow-up Information   Follow up with [name], MD. Schedule an appointment as soon as possible for a visit in 4 days.   Specialty:  Internal Medicine   Contact information:   [address] [place] [address] [phone]       Follow up with [facility]. Schedule an appointment as soon as possible for a visit in 3 weeks.   Contact information:   [address]     [place] [address] [phone]      Follow up with [facility]. (Physical Therapy)    Contact information:   [place] [address] [phone]         Discharge Instructions  and  Discharge Medications          Discharge Instructions   Diet - low sodium heart healthy    Complete by:  As directed      Discharge instructions    Complete by:  As directed   Follow with Primary MD [name], MD in 4 days   Get CBC, CMP checked  by Primary MD next visit.    Activity: As tolerated  with Full fall precautions use walker/cane & assistance as needed   Disposition Home     Diet: Heart Healthy    For Heart failure patients - Check your Weight same time everyday, if you gain over 2 pounds, or you develop in leg swelling, experience more shortness  of breath or chest pain, call your Primary MD immediately. Follow Cardiac Low Salt Diet and 1.8 lit/day fluid restriction.   On your next visit with her primary care physician please Get Medicines reviewed and adjusted.  Please request your Prim.MD to go over all Hospital Tests and Procedure/Radiological results at the follow up, please get all Hospital records sent to your Prim MD by signing hospital release before you go home.   If you experience worsening of your admission symptoms, develop shortness of breath, life threatening emergency, suicidal or homicidal thoughts you must seek medical attention immediately by calling 911 or calling your MD immediately  if symptoms less severe.  You Must read complete instructions/literature along with all the possible adverse reactions/side effects for all the Medicines you take and that have been prescribed to you. Take any new Medicines after you have completely understood and accpet all the possible adverse reactions/side effects.   Do not drive, operating heavy machinery, perform activities at heights, swimming or participation in water activities or provide baby sitting services if your were admitted for syncope or siezures until you have seen by Primary MD or a Neurologist and advised to do so again.  Do not drive when taking Pain medications.    Do not take more than prescribed Pain, Sleep and Anxiety Medications  Special Instructions: If you have smoked or chewed Tobacco  in the last 2 yrs please stop smoking, stop any regular Alcohol  and or any Recreational drug use.  Wear Seat belts while driving.   Please note  You were cared for by a hospitalist during your hospital  stay. If you have any questions about your discharge medications or the care you received while you were in the hospital after you are discharged, you can call the unit and asked to speak with the hospitalist on call if the hospitalist that took care of you is not available. Once you are discharged, your primary care physician will handle any further medical issues. Please note that NO REFILLS for any discharge medications will be authorized once you are discharged, as it is imperative that you return to your primary care physician (or establish a relationship with a primary care physician if you do not have one) for your aftercare needs so that they can reassess your need for medications and monitor your lab values.  Follow with Primary MD [name], MD in 4 days   Get CBC, CMP checked  by Primary MD next visit.    Activity: As tolerated with Full fall precautions use walker/cane & assistance as needed   Disposition Home     Diet: Heart Healthy    For Heart failure patients - Check your Weight same time everyday, if you gain over 2 pounds, or you develop in leg swelling, experience more shortness of breath or chest pain, call your Primary MD immediately. Follow Cardiac Low Salt Diet and 1.8 lit/day fluid restriction.   On your next visit with her primary care physician please Get Medicines reviewed and adjusted.  Please request your Prim.MD to go over all Hospital Tests and Procedure/Radiological results at the follow up, please get all Hospital records sent to your Prim MD by signing hospital release before you go home.   If you experience worsening of your admission symptoms, develop shortness of breath, life threatening emergency, suicidal or homicidal thoughts you must seek medical attention immediately by calling 911 or calling your MD immediately  if symptoms less severe.  You Must read complete  instructions/literature along with all the possible adverse reactions/side effects  for all the Medicines you take and that have been prescribed to you. Take any new Medicines after you have completely understood and accpet all the possible adverse reactions/side effects.   Do not drive, operating heavy machinery, perform activities at heights, swimming or participation in water activities or provide baby sitting services if your were admitted for syncope or siezures until you have seen by Primary MD or a Neurologist and advised to do so again.  Do not drive when taking Pain medications.    Do not take more than prescribed Pain, Sleep and Anxiety Medications  Special Instructions: If you have smoked or chewed Tobacco  in the last 2 yrs please stop smoking, stop any regular Alcohol  and or any Recreational drug use.  Wear Seat belts while driving.   Please note  You were cared for by a hospitalist during your hospital stay. If you have any questions about your discharge medications or the care you received while you were in the hospital after you are discharged, you can call the unit and asked to speak with the hospitalist on call if the hospitalist that took care of you is not available. Once you are discharged, your primary care physician will handle any further medical issues. Please note that NO REFILLS for any discharge medications will be authorized once you are discharged, as it is imperative that you return to your primary care physician (or establish a relationship with a primary care physician if you do not have one) for your aftercare needs so that they can reassess your need for medications and monitor your lab values.     Increase activity slowly    Complete by:  As directed             Medication List    STOP taking these medications       aspirin 81 MG tablet  Replaced by:  aspirin EC 325 MG tablet      TAKE these medications       albuterol 108 (90 BASE) MCG/ACT inhaler  Commonly known as:  PROVENTIL HFA;VENTOLIN HFA  Inhale 2 puffs into the lungs  every 6 (six) hours as needed for wheezing.     aspirin EC 325 MG tablet  Take 1 tablet (325 mg total) by mouth daily.     DULoxetine 60 MG capsule  Commonly known as:  CYMBALTA  Take 60 mg by mouth daily.     ezetimibe 10 MG tablet  Commonly known as:  ZETIA  Take 1 tablet (10 mg total) by mouth every morning.     meclizine 25 MG tablet  Commonly known as:  ANTIVERT  Take 1 tablet (25 mg total) by mouth 3 (three) times daily as needed.     Melatonin 10 MG Caps  Take 10 mg by mouth daily.     metoprolol succinate 25 MG 24 hr tablet  Commonly known as:  TOPROL XL  Take 1 tablet (25 mg total) by mouth daily.     nitroGLYCERIN 0.3 MG SL tablet  Commonly known as:  NITROSTAT  Place 0.3 mg under the tongue every 5 (five) minutes as needed for chest pain.     omega-3 acid ethyl esters 1 G capsule  Commonly known as:  LOVAZA  Take 1 capsule (1 g total) by mouth daily.     ranolazine 500 MG 12 hr tablet  Commonly known as:  RANEXA  Take 1 tablet (500  mg total) by mouth 2 (two) times daily.     rosuvastatin 20 MG tablet  Commonly known as:  CRESTOR  Take 1 tablet (20 mg total) by mouth daily.          Diet and Activity recommendation: See Discharge Instructions above   Consults obtained - Neuro   Major procedures and Radiology Reports - PLEASE review detailed and final reports for all details, in brief -   Echo  - Left ventricle: The cavity size was normal. Wall thickness was normal. Systolic function was mildly reduced. The estimated ejection fraction was in the range of 45% to 50%. Regional wall motion abnormalities cannot be excluded. Doppler parameters are consistent with abnormal left ventricular relaxation (grade 1 diastolic dysfunction). - Right atrium: The atrium was mildly dilated.  Impressions:  - No cardiac source of emboli was indentified.    Carotids  Bilateral carotid artery duplex completed: 1-39% ICA stenosis. Vertebral artery flow is  antegrade.     Dg Chest 2 View  03/19/2014   CLINICAL DATA:  Cervical and vomiting today.  EXAM: CHEST  2 VIEW  COMPARISON:  Chest x-rays dated 08/15/2013 and 11/23/2012  FINDINGS: Heart size and pulmonary vascularity are normal. There are no acute infiltrates or effusions. There is slight scarring in the lingula. No acute osseous abnormality. Old compression deformity of what appears to be L1.  IMPRESSION: No acute abnormalities.   Electronically Signed   By: Rozetta Nunnery M.D.   On: 03/19/2014 20:24   Ct Head Wo Contrast  03/20/2014   CLINICAL DATA:  Vomiting and ataxia. Acute loss of vision in both eyes which has since returned. Dizziness.  EXAM: CT HEAD WITHOUT CONTRAST  TECHNIQUE: Contiguous axial images were obtained from the base of the skull through the vertex without intravenous contrast.  COMPARISON:  None.  FINDINGS: Mild diffuse cerebral atrophy. No ventricular dilatation. No mass effect or midline shift. No abnormal extra-axial fluid collections. Gray-white matter junctions are distinct. Basal cisterns are not effaced. No evidence of acute intracranial hemorrhage. No depressed skull fractures. Visualized paranasal sinuses and mastoid air cells are not opacified. Vascular calcifications.  IMPRESSION: No acute intracranial abnormalities. Chronic atrophy and small vessel ischemic changes.   Electronically Signed   By: Lucienne Capers M.D.   On: 03/20/2014 00:17   Mr Brain Wo Contrast  03/20/2014   CLINICAL DATA:  Vomiting and ataxia. Acute loss of vision in both eyes which has since returned. Dizziness. Question stroke.  EXAM: MRI HEAD WITHOUT CONTRAST  MRA HEAD WITHOUT CONTRAST  TECHNIQUE: Multiplanar, multiecho pulse sequences of the brain and surrounding structures were obtained without intravenous contrast. Angiographic images of the head were obtained using MRA technique without contrast.  COMPARISON:  None.  FINDINGS: MRI HEAD FINDINGS  The diffusion-weighted images demonstrate no evidence  for acute or subacute infarction. No hemorrhage or mass lesion is present. The ventricles are of normal size. Mild atrophy and minimal white matter changes are within normal limits for age. No significant extra-axial fluid collection is present.  Flow is present in the major intracranial arteries. The globes and orbits are intact. Paranasal sinuses demonstrate minimal mucosal thickening along the floor of the maxillary sinuses bilaterally. No other focal mucosal thickening is present. There is some fluid in the mastoid air cells. No obstructing nasopharyngeal lesion is evident.  MRA HEAD FINDINGS  The internal carotid arteries are within normal limits from the high cervical segments through the ICA termini bilaterally. The A1 and M1 segments are  normal. No definite anterior communicating artery is present. ACA and MCA branch vessels are unremarkable.  The left vertebral artery is the dominant vessel. The right vertebral artery is hypoplastic. The PICA origins are visualized and normal bilaterally. A prominent right AICA is present as well. Both posterior cerebral arteries originate from the basilar tip. The PCA branch vessels are within normal limits.  IMPRESSION: 1. Normal MRI appearance of the brain for age. 2. Minimal maxillary sinus disease. 3. Fluid in the mastoid air cells bilaterally. No obstructing nasopharyngeal lesion is present. 4. Normal variant MRA circle of Willis without evidence for significant proximal stenosis, aneurysm, or branch vessel occlusion.   Electronically Signed   By: Lawrence Santiago M.D.   On: 03/20/2014 14:04   Mr Jodene Nam Head/brain Wo Cm  03/20/2014   CLINICAL DATA:  Vomiting and ataxia. Acute loss of vision in both eyes which has since returned. Dizziness. Question stroke.  EXAM: MRI HEAD WITHOUT CONTRAST  MRA HEAD WITHOUT CONTRAST  TECHNIQUE: Multiplanar, multiecho pulse sequences of the brain and surrounding structures were obtained without intravenous contrast. Angiographic images of  the head were obtained using MRA technique without contrast.  COMPARISON:  None.  FINDINGS: MRI HEAD FINDINGS  The diffusion-weighted images demonstrate no evidence for acute or subacute infarction. No hemorrhage or mass lesion is present. The ventricles are of normal size. Mild atrophy and minimal white matter changes are within normal limits for age. No significant extra-axial fluid collection is present.  Flow is present in the major intracranial arteries. The globes and orbits are intact. Paranasal sinuses demonstrate minimal mucosal thickening along the floor of the maxillary sinuses bilaterally. No other focal mucosal thickening is present. There is some fluid in the mastoid air cells. No obstructing nasopharyngeal lesion is evident.  MRA HEAD FINDINGS  The internal carotid arteries are within normal limits from the high cervical segments through the ICA termini bilaterally. The A1 and M1 segments are normal. No definite anterior communicating artery is present. ACA and MCA branch vessels are unremarkable.  The left vertebral artery is the dominant vessel. The right vertebral artery is hypoplastic. The PICA origins are visualized and normal bilaterally. A prominent right AICA is present as well. Both posterior cerebral arteries originate from the basilar tip. The PCA branch vessels are within normal limits.  IMPRESSION: 1. Normal MRI appearance of the brain for age. 2. Minimal maxillary sinus disease. 3. Fluid in the mastoid air cells bilaterally. No obstructing nasopharyngeal lesion is present. 4. Normal variant MRA circle of Willis without evidence for significant proximal stenosis, aneurysm, or branch vessel occlusion.   Electronically Signed   By: Lawrence Santiago M.D.   On: 03/20/2014 14:04    Micro Results      No results found for this or any previous visit (from the past 240 hour(s)).     Today   Subjective:   Daniel Singleton today has no headache,no chest abdominal pain,no new weakness  tingling or numbness, feels much better wants to go home today.    Objective:   Blood pressure 143/83, pulse 76, temperature 97.6 F (36.4 C), temperature source Oral, resp. rate 19, height 5\' 11"  (1.803 m), weight 92.455 kg (203 lb 13.2 oz), SpO2 92.00%.   Intake/Output Summary (Last 24 hours) at 03/21/14 1243 Last data filed at 03/21/14 0841  Gross per 24 hour  Intake    240 ml  Output   1450 ml  Net  -1210 ml    Exam Awake Alert, Oriented x  3, No new F.N deficits, Normal affect [place].AT,PERRAL Supple Neck,No JVD, No cervical lymphadenopathy appriciated.  Symmetrical Chest wall movement, Good air movement bilaterally, CTAB RRR,No Gallops,Rubs or new Murmurs, No Parasternal Heave +ve B.Sounds, Abd Soft, Non tender, No organomegaly appriciated, No rebound -guarding or rigidity. No Cyanosis, Clubbing or edema, No new Rash or bruise  Data Review   CBC w Diff: Lab Results  Component Value Date   WBC 8.0 03/20/2014   HGB 13.2 03/20/2014   HCT 38.8* 03/20/2014   PLT 130* 03/20/2014   LYMPHOPCT 10* 03/20/2014   MONOPCT 8 03/20/2014   EOSPCT 1 03/20/2014   BASOPCT 0 03/20/2014    CMP: Lab Results  Component Value Date   NA 139 03/20/2014   K 4.1 03/20/2014   CL 99 03/20/2014   CO2 24 03/20/2014   BUN 11 03/20/2014   CREATININE 0.90 03/20/2014   CREATININE 1.00 05/04/2013   PROT 6.2 03/20/2014   ALBUMIN 3.5 03/20/2014   BILITOT 0.3 03/20/2014   ALKPHOS 46 03/20/2014   AST 19 03/20/2014   ALT 18 03/20/2014  .  Lab Results  Component Value Date   CHOL 123 03/20/2014   HDL 42 03/20/2014   LDLCALC 65 03/20/2014   TRIG 78 03/20/2014   CHOLHDL 2.9 03/20/2014    Lab Results  Component Value Date   HGBA1C 6.5* 03/20/2014     Total Time in preparing paper work, data evaluation and todays exam - 35 minutes  Thurnell Lose M.D on 03/21/2014 at 12:43 PM  [facility] Office  [phone]   **Disclaimer: This note may have been dictated with voice recognition software.  Similar sounding words can inadvertently be transcribed and this note may contain transcription errors which may not have been corrected upon publication of note.**

## 2014-03-21 NOTE — Evaluation (Signed)
Physical Therapy Evaluation Patient Details Name: Daniel Singleton MRN: [id number] DOB: [birth year] Today's Date: 03/21/2014   History of Present Illness  Pt admitted with nausea and vomiting with increased dizziness with difficulty ambulating.  Clinical Impression  Pt admitted with above and MD feels pt had TIA per family with vertigo being symptom of TIA. Pt was able to ambulate 250' with RW with Min/guard to S with slight lean to the R at times and dizziness.  Recommend HHPT to work with pt on mobility to work towards safe and I ambulation without AD.  Pt, wife, and son educated on s/s of CVA. Pt scheduled for d/c and will d/c from skilled PT services.     Follow Up Recommendations Home health PT    Equipment Recommendations  None recommended by PT    Recommendations for Other Services       Precautions / Restrictions Restrictions Weight Bearing Restrictions: No      Mobility  Bed Mobility Overal bed mobility: Independent                Transfers Overall transfer level: Independent                  Ambulation/Gait Ambulation/Gait assistance: Supervision;Min guard Ambulation Distance (Feet): 250 Feet Assistive device: Rolling walker (2 wheeled) Gait Pattern/deviations: Step-through pattern     General Gait Details: Slight lean to the R.  Pt c/o minimal dizziness throughout gait.  Stairs            Wheelchair Mobility    Modified Rankin (Stroke Patients Only)       Balance Overall balance assessment: Needs assistance   Sitting balance-Leahy Scale: Good       Standing balance-Leahy Scale: Fair                               Pertinent Vitals/Pain Denies pain.    Home Living Family/patient expects to be discharged to:: Private residence Living Arrangements: Spouse/significant other   Type of Home: House Home Access: Stairs to enter Entrance Stairs-Rails: Right Entrance Stairs-Number of Steps: 5 Home Layout: One  level Home Equipment: [place] - single point;Walker - 4 wheels;Walker - 2 wheels;Bedside commode;Shower seat Additional Comments: oxygen at night    Prior Function Level of Independence: Independent               Hand Dominance        Extremity/Trunk Assessment   Upper Extremity Assessment: Overall WFL for tasks assessed           Lower Extremity Assessment: Overall WFL for tasks assessed      Cervical / Trunk Assessment: Normal  Communication   Communication: No difficulties  Cognition Arousal/Alertness: Awake/alert Behavior During Therapy: WFL for tasks assessed/performed Overall Cognitive Status: Within Functional Limits for tasks assessed                      General Comments      Exercises        Assessment/Plan    PT Assessment Patent does not need any further PT services  PT Diagnosis     PT Problem List    PT Treatment Interventions     PT Goals (Current goals can be found in the Care Plan section) Acute Rehab PT Goals PT Goal Formulation: No goals set, d/c therapy    Frequency     Barriers to discharge  Co-evaluation               End of Session Equipment Utilized During Treatment: Gait belt Activity Tolerance: Patient tolerated treatment well Patient left: in chair;with call bell/phone within reach;with family/visitor present Nurse Communication: Mobility status         Time: 0940-1001 PT Time Calculation (min): 21 min   Charges:   PT Evaluation $Initial PT Evaluation Tier I: 1 Procedure PT Treatments $Gait Training: 8-22 mins   PT G Codes:          Jalayia Bagheri LUBECK 03/21/2014, 10:11 AM

## 2014-03-21 NOTE — Progress Notes (Signed)
Stroke Team Progress Note  SUBJECTIVE His wife and son [name] are at the bedside. They have asked if therapy is really needed. Son had questions about TIA vs vertigo as his dx.  OBJECTIVE Most recent Vital Signs: Filed Vitals:   03/20/14 2000 03/20/14 2347 03/21/14 0405 03/21/14 0746  BP: 120/71 132/64 131/79 143/83  Pulse: 64 82 84 76  Temp: 97.9 F (36.6 C) 97.8 F (36.6 C) 98 F (36.7 C) 97.6 F (36.4 C)  TempSrc: Oral Oral Oral Oral  Resp: 18 18 18 19   Height:      Weight:   92.455 kg (203 lb 13.2 oz)   SpO2: 95% 94% 92% 92%   IV Fluid Intake:     MEDICATIONS  . aspirin  325 mg Oral Daily  . atorvastatin  40 mg Oral q1800  . DULoxetine  60 mg Oral Daily  . enoxaparin (LOVENOX) injection  40 mg Subcutaneous Q24H  . ezetimibe  10 mg Oral q morning - 10a  . metoprolol succinate  25 mg Oral Daily  . omega-3 acid ethyl esters  1 g Oral Daily  . ranolazine  500 mg Oral BID   PRN:  albuterol, nitroGLYCERIN, senna-docusate  Diet:  Carb Control thin liquids Activity:  OOB with assistance DVT Prophylaxis:  Lovenox 40 mg sq daily   CLINICALLY SIGNIFICANT STUDIES Basic Metabolic Panel:   Recent Labs Lab 03/19/14 1910 03/20/14 0438  NA 138 139  K 4.2 4.1  CL 98 99  CO2 28 24  GLUCOSE 173* 152*  BUN 12 11  CREATININE 1.01 0.90  CALCIUM 8.9 8.9   Liver Function Tests:   Recent Labs Lab 03/20/14 0438  AST 19  ALT 18  ALKPHOS 46  BILITOT 0.3  PROT 6.2  ALBUMIN 3.5   CBC:   Recent Labs Lab 03/19/14 1910 03/20/14 0438  WBC 8.4 8.0  NEUTROABS  --  6.5  HGB 13.1 13.2  HCT 37.5* 38.8*  MCV 89.1 88.2  PLT PLATELET CLUMPS NOTED ON SMEAR, COUNT APPEARS DECREASED 130*   Coagulation: No results found for this basename: LABPROT, INR,  in the last 168 hours Cardiac Enzymes: No results found for this basename: CKTOTAL, CKMB, CKMBINDEX, TROPONINI,  in the last 168 hours Urinalysis: No results found for this basename: COLORURINE, APPERANCEUR, LABSPEC, PHURINE,  GLUCOSEU, HGBUR, BILIRUBINUR, KETONESUR, PROTEINUR, UROBILINOGEN, NITRITE, LEUKOCYTESUR,  in the last 168 hours Lipid Panel    Component Value Date/Time   CHOL 123 03/20/2014 0435   TRIG 78 03/20/2014 0435   TRIG 96 08/01/2013 0952   HDL 42 03/20/2014 0435   CHOLHDL 2.9 03/20/2014 0435   VLDL 16 03/20/2014 0435   LDLCALC 65 03/20/2014 0435   LDLCALC 53 08/01/2013 0952   HgbA1C  Lab Results  Component Value Date   HGBA1C 6.5* 03/20/2014    Urine Drug Screen:   No results found for this basename: labopia,  cocainscrnur,  labbenz,  amphetmu,  thcu,  labbarb    Alcohol Level: No results found for this basename: ETH,  in the last 168 hours  CT of the brain  03/20/2014    No acute intracranial abnormalities. Chronic atrophy and small vessel ischemic changes.  MRI of the brain  03/20/2014   1. Normal MRI appearance of the brain for age. 2. Minimal maxillary sinus disease. 3. Fluid in the mastoid air cells bilaterally. No obstructing nasopharyngeal lesion is present.  MRA of the brain  03/20/2014     Normal variant MRA circle of Willis  without evidence for significant proximal stenosis, aneurysm, or branch vessel occlusion.    Carotid Doppler  EF 45-50% with no source of embolus.   2D Echocardiogram  EF 55-60% with no source of embolus.   CXR  03/19/2014   No acute abnormalities.    EKG  normal EKG, normal sinus rhythm. For complete results please see formal report.   Therapy Recommendations no anticipated therapy needs  Physical Exam   Pleasant elderly obese Caucasian male not in distress.Awake alert. Afebrile. Head is nontraumatic. Neck is supple without bruit. Hearing is normal. Cardiac exam no murmur or gallop. Lungs are clear to auscultation. Distal pulses are well felt. Neurological Exam ;  Awake  Alert oriented x 3. Normal speech and language.eye movements full without nystagmus.fundi were not visualized. Vision acuity and fields appear normal. Hearing is normal. Palatal movements are  normal. Face symmetric. Tongue midline. Normal strength, tone, reflexes and coordination. Normal sensation. Gait deferred.  ASSESSMENT Daniel Singleton is a 73 y.o. male presenting with vertigo with nausea, vomiting, double vision, numbness and weakness. He was not a t-PA candidate secondary to mild symptoms. MRI negative for acute stroke. Dx:  posterior circulation TIA. Patient is at risk for subsequent stroke.  On aspirin 81 mg orally every day prior to admission. Now on aspirin 325 mg orally every day for secondary stroke prevention. Patient with resultant vertigo. Stroke work up completed.  Hypertension, BP 113143/66-83 past 24h,  Diabetes, HgbA1c 6.5, goal < 7.0 Hyperlipidemia, LDL 65, on crestor 20 mg, lovaza & zetia daily PTA, formulary replacement lipitor 40 mg & zetia in hospital, at goal LDL < 70 for diabetics Hx CAD obstructive sleep apnea, CPAP & O2 at home at hs Mild nonspecific thrombocytopenia.  Hospital day # 2  TREATMENT/PLAN  Change aspirin to  clopidogrel 75 mg orally every day for secondary stroke prevention. Patient is not interested in [facility] PT eval prior to discharge.  Burnetta Sabin, MSN, RN, ANVP-BC, ANP-BC, GNP-BC [facility] Pager: [phone] 03/21/2014 8:25 AM  I have personally examined this patient, reviewed notes, independently viewed imaging studies, participated in medical decision making and plan of care.  . Agree with note above.    Antony Contras, MD  Pager: [phone] 03/21/2014 2:53 PM       To contact Stroke Continuity provider, please refer to [email or web address] After hours, contact General Neurology

## 2014-03-21 NOTE — Discharge Instructions (Signed)
Follow with Primary MD [name], MD in 4 days   Get CBC, CMP checked  by Primary MD next visit.    Activity: As tolerated with Full fall precautions use walker/cane & assistance as needed   Disposition Home     Diet: Heart Healthy    For Heart failure patients - Check your Weight same time everyday, if you gain over 2 pounds, or you develop in leg swelling, experience more shortness of breath or chest pain, call your Primary MD immediately. Follow Cardiac Low Salt Diet and 1.8 lit/day fluid restriction.   On your next visit with her primary care physician please Get Medicines reviewed and adjusted.  Please request your Prim.MD to go over all Hospital Tests and Procedure/Radiological results at the follow up, please get all Hospital records sent to your Prim MD by signing hospital release before you go home.   If you experience worsening of your admission symptoms, develop shortness of breath, life threatening emergency, suicidal or homicidal thoughts you must seek medical attention immediately by calling 911 or calling your MD immediately  if symptoms less severe.  You Must read complete instructions/literature along with all the possible adverse reactions/side effects for all the Medicines you take and that have been prescribed to you. Take any new Medicines after you have completely understood and accpet all the possible adverse reactions/side effects.   Do not drive, operating heavy machinery, perform activities at heights, swimming or participation in water activities or provide baby sitting services if your were admitted for syncope or siezures until you have seen by Primary MD or a Neurologist and advised to do so again.  Do not drive when taking Pain medications.    Do not take more than prescribed Pain, Sleep and Anxiety Medications  Special Instructions: If you have smoked or chewed Tobacco  in the last 2 yrs please stop smoking, stop any regular Alcohol  and or any  Recreational drug use.  Wear Seat belts while driving.   Please note  You were cared for by a hospitalist during your hospital stay. If you have any questions about your discharge medications or the care you received while you were in the hospital after you are discharged, you can call the unit and asked to speak with the hospitalist on call if the hospitalist that took care of you is not available. Once you are discharged, your primary care physician will handle any further medical issues. Please note that NO REFILLS for any discharge medications will be authorized once you are discharged, as it is imperative that you return to your primary care physician (or establish a relationship with a primary care physician if you do not have one) for your aftercare needs so that they can reassess your need for medications and monitor your lab values.     STROKE/TIA DISCHARGE INSTRUCTIONS SMOKING Cigarette smoking nearly doubles your risk of having a stroke & is the single most alterable risk factor  If you smoke or have smoked in the last 12 months, you are advised to quit smoking for your health.  Most of the excess cardiovascular risk related to smoking disappears within a year of stopping.  Ask you doctor about anti-smoking medications  [place] Quit Line: 1-800-QUIT NOW  Free Smoking Cessation Classes [phone]  CHOLESTEROL Know your levels; limit fat & cholesterol in your diet  Lipid Panel     Component Value Date/Time   CHOL 123 03/20/2014 0435   TRIG 78 03/20/2014 0435  TRIG 96 08/01/2013 0952   HDL 42 03/20/2014 0435   CHOLHDL 2.9 03/20/2014 0435   VLDL 16 03/20/2014 0435   LDLCALC 65 03/20/2014 0435   LDLCALC 53 08/01/2013 0952      Many patients benefit from treatment even if their cholesterol is at goal.  Goal: Total Cholesterol (CHOL) less than 160  Goal:  Triglycerides (TRIG) less than 150  Goal:  HDL greater than 40  Goal:  LDL (LDLCALC) less than 100   BLOOD PRESSURE  American Stroke Association blood pressure target is less that 120/80 mm/Hg  Your discharge blood pressure is:  BP: 143/83 mmHg  Monitor your blood pressure  Limit your salt and alcohol intake  Many individuals will require more than one medication for high blood pressure  DIABETES (A1c is a blood sugar average for last 3 months) Goal HGBA1c is under 7% (HBGA1c is blood sugar average for last 3 months)  Diabetes: Diagnosis of diabetes:  Your A1c:6.5 %    Lab Results  Component Value Date   HGBA1C 6.5* 03/20/2014     Your HGBA1c can be lowered with medications, healthy diet, and exercise.  Check your blood sugar as directed by your physician  Call your physician if you experience unexplained or low blood sugars.  PHYSICAL ACTIVITY/REHABILITATION Goal is 30 minutes at least 4 days per week  Activity: Increase activity slowly, and Walk with assistance, Therapies: Physical Therapy: Home Health Return to work:   Activity decreases your risk of heart attack and stroke and makes your heart stronger.  It helps control your weight and blood pressure; helps you relax and can improve your mood.  Participate in a regular exercise program.  Talk with your doctor about the best form of exercise for you (dancing, walking, swimming, cycling).  DIET/WEIGHT Goal is to maintain a healthy weight  Your discharge diet is: Carb Control, heart healthy diet Your height is:  Height: 5\' 11"  (180.3 cm) Your current weight is: Weight: 92.455 kg (203 lb 13.2 oz) Your Body Mass Index (BMI) is:  BMI (Calculated): 28.6  Following the type of diet specifically designed for you will help prevent another stroke.  Your goal weight range is:    Your goal Body Mass Index (BMI) is 19-24.  Healthy food habits can help reduce 3 risk factors for stroke:  High cholesterol, hypertension, and excess weight.  RESOURCES Stroke/Support Group:  Call [phone]   STROKE EDUCATION PROVIDED/REVIEWED AND GIVEN TO PATIENT  Stroke warning signs and symptoms How to activate emergency medical system (call 911). Medications prescribed at discharge. Need for follow-up after discharge. Personal risk factors for stroke. Pneumonia vaccine given: No Flu vaccine given: No My questions have been answered, the writing is legible, and I understand these instructions.  I will adhere to these goals & educational materials that have been provided to me after my discharge from the hospital.

## 2014-03-21 NOTE — Progress Notes (Signed)
Pt discharged to home per MD order. Pt received and reviewed all discharge instructions and medication information including follow-up appointments and prescription information. Pt verbalized understanding. Pt alert and oriented at discharge with no complaints of pain. Pt IV and telemetry box removed prior to discharge. Pt escorted to private vehicle via wheelchair by guest services volunteer. Tonatiuh Mallon C  

## 2014-03-22 NOTE — ED Provider Notes (Signed)
Medical screening examination/treatment/procedure(s) were conducted as a shared visit with resident-physician practitioner(s) and myself.  I personally evaluated the patient during the encounter.  Pt is a 72 y.o. male with pmhx as above presenting with sudden onset severe dizziness, transient BL visual loss, vomiting.  Pt found to have horizontal nystagmus on PE, dec L ear hearing.  He hasreproducible symptoms w/ mvmt.  He is comfortable while still. +ataxic gait.  Neuro exam otherwise unremarkable including nml finger-to-nose and heel-to-shin.  Given age and risk factors CVA must be considered. Pt would not be candidate for TPA given low NIH score and improving symptoms. CT head nml. Neurology consulted, [facility] will admit.    EKG Interpretation  Date/Time:  Tuesday March 19 2014 21:24:17 EDT Ventricular Rate:  79 PR Interval:  194 QRS Duration: 90 QT Interval:  398 QTC Calculation: 456 R Axis:   66 Text Interpretation:  Sinus rhythm ED PHYSICIAN INTERPRETATION AVAILABLE IN [place] Confirmed by TEST, Record ([id number]) on 03/21/2014 7:47:19 AM        Neta Ehlers, MD 03/22/14 2016

## 2014-03-25 ENCOUNTER — Telehealth: Payer: Self-pay | Admitting: Cardiovascular Disease

## 2014-03-25 ENCOUNTER — Telehealth: Payer: Self-pay | Admitting: Neurology

## 2014-03-25 DIAGNOSIS — Z125 Encounter for screening for malignant neoplasm of prostate: Secondary | ICD-10-CM | POA: Diagnosis not present

## 2014-03-25 DIAGNOSIS — I2510 Atherosclerotic heart disease of native coronary artery without angina pectoris: Secondary | ICD-10-CM | POA: Diagnosis not present

## 2014-03-25 DIAGNOSIS — E119 Type 2 diabetes mellitus without complications: Secondary | ICD-10-CM | POA: Diagnosis not present

## 2014-03-25 NOTE — Telephone Encounter (Signed)
Patient was discharged from the hospital last week---he had light stroke and vertigo.  Mrs. Wants patient to see Dr. Claiborne Billings ASAP to go over his medications.  He has an appt 04/19/14.   Please advise.

## 2014-03-25 NOTE — Telephone Encounter (Signed)
Talked with patient wife and let her know the date that was giving to her was fine .Marland Kitchen She understood and agreed

## 2014-03-25 NOTE — Telephone Encounter (Signed)
Returned call to patient's wife. Patient was hospitalized for TIA. He has been off plavix since Jan. (Dr. Ardeth Perfect and patient made this decision) and he is currently on asa 325mg  QD. Dr. Leonie Man had suggested patient try a study -  "Patient interested in considering [facility] research study for secondary stroke prevention. Dr. Tilden Dome spoke with patient and family. [facility] will follow up." but wife wanted to wait until Dr. Claiborne Billings was back in town before decisions were made on this. Dr. Leonie Man recommenced he follow up with Dr. Claiborne Billings for medication management for cardiac and stroke prevention, per wife.   Will defer to Dr. Claiborne Billings to review and advise.

## 2014-03-25 NOTE — Telephone Encounter (Signed)
Patient's wife calling to schedule hospital follow up with Dr. Leonie Man, gave them first available with Dr. Erlinda Hong but patient's wife states that Dr. Leonie Man wanted to see him back in 3 weeks. Please return call to patient's wife and advise.

## 2014-04-04 NOTE — Telephone Encounter (Signed)
Appointment set for 04/19/14

## 2014-04-08 DIAGNOSIS — Z23 Encounter for immunization: Secondary | ICD-10-CM | POA: Diagnosis not present

## 2014-04-08 DIAGNOSIS — I10 Essential (primary) hypertension: Secondary | ICD-10-CM | POA: Diagnosis not present

## 2014-04-08 DIAGNOSIS — Z1331 Encounter for screening for depression: Secondary | ICD-10-CM | POA: Diagnosis not present

## 2014-04-08 DIAGNOSIS — E119 Type 2 diabetes mellitus without complications: Secondary | ICD-10-CM | POA: Diagnosis not present

## 2014-04-08 DIAGNOSIS — Z0000 Encounter for general adult medical examination without abnormal findings: Secondary | ICD-10-CM | POA: Diagnosis not present

## 2014-04-08 DIAGNOSIS — K219 Gastro-esophageal reflux disease without esophagitis: Secondary | ICD-10-CM | POA: Diagnosis not present

## 2014-04-08 DIAGNOSIS — E785 Hyperlipidemia, unspecified: Secondary | ICD-10-CM | POA: Diagnosis not present

## 2014-04-08 DIAGNOSIS — J449 Chronic obstructive pulmonary disease, unspecified: Secondary | ICD-10-CM | POA: Diagnosis not present

## 2014-04-11 DIAGNOSIS — Z1212 Encounter for screening for malignant neoplasm of rectum: Secondary | ICD-10-CM | POA: Diagnosis not present

## 2014-04-17 DIAGNOSIS — E119 Type 2 diabetes mellitus without complications: Secondary | ICD-10-CM | POA: Diagnosis not present

## 2014-04-17 DIAGNOSIS — H2589 Other age-related cataract: Secondary | ICD-10-CM | POA: Diagnosis not present

## 2014-04-19 ENCOUNTER — Ambulatory Visit (INDEPENDENT_AMBULATORY_CARE_PROVIDER_SITE_OTHER): Payer: Medicare Other | Admitting: Cardiovascular Disease

## 2014-04-19 VITALS — BP 128/82 | HR 71 | Ht 70.0 in | Wt 208.6 lb

## 2014-04-19 DIAGNOSIS — R279 Unspecified lack of coordination: Secondary | ICD-10-CM

## 2014-04-19 DIAGNOSIS — Z6829 Body mass index (BMI) 29.0-29.9, adult: Secondary | ICD-10-CM | POA: Diagnosis not present

## 2014-04-19 DIAGNOSIS — J439 Emphysema, unspecified: Secondary | ICD-10-CM

## 2014-04-19 DIAGNOSIS — E119 Type 2 diabetes mellitus without complications: Secondary | ICD-10-CM | POA: Diagnosis not present

## 2014-04-19 DIAGNOSIS — G4733 Obstructive sleep apnea (adult) (pediatric): Secondary | ICD-10-CM

## 2014-04-19 DIAGNOSIS — G609 Hereditary and idiopathic neuropathy, unspecified: Secondary | ICD-10-CM | POA: Diagnosis not present

## 2014-04-19 DIAGNOSIS — J438 Other emphysema: Secondary | ICD-10-CM

## 2014-04-19 DIAGNOSIS — I2510 Atherosclerotic heart disease of native coronary artery without angina pectoris: Secondary | ICD-10-CM | POA: Diagnosis not present

## 2014-04-19 DIAGNOSIS — E785 Hyperlipidemia, unspecified: Secondary | ICD-10-CM | POA: Diagnosis not present

## 2014-04-19 DIAGNOSIS — R270 Ataxia, unspecified: Secondary | ICD-10-CM

## 2014-04-19 DIAGNOSIS — I2583 Coronary atherosclerosis due to lipid rich plaque: Secondary | ICD-10-CM

## 2014-04-19 DIAGNOSIS — G458 Other transient cerebral ischemic attacks and related syndromes: Secondary | ICD-10-CM

## 2014-04-19 DIAGNOSIS — I10 Essential (primary) hypertension: Secondary | ICD-10-CM | POA: Diagnosis not present

## 2014-04-19 DIAGNOSIS — J449 Chronic obstructive pulmonary disease, unspecified: Secondary | ICD-10-CM | POA: Diagnosis not present

## 2014-04-19 MED ORDER — CLOPIDOGREL BISULFATE 75 MG PO TABS
75.0000 mg | ORAL_TABLET | Freq: Every day | ORAL | Status: DC
Start: 2014-04-19 — End: 2015-05-26

## 2014-04-19 NOTE — Patient Instructions (Signed)
Your physician has recommended you make the following change in your medication..  1. STOP ASPIRING 325mg . START ASPIRIN 81 mg 2. START PLAVIX 75mg  once daily  Your physician wants you to follow-up in: 4 months with Dr. Claiborne Billings.  You will receive a reminder letter in the mail two months in advance. If you don't receive a letter, please call our office to schedule the follow-up appointment.

## 2014-04-21 ENCOUNTER — Encounter: Payer: Self-pay | Admitting: Cardiovascular Disease

## 2014-04-21 DIAGNOSIS — G459 Transient cerebral ischemic attack, unspecified: Secondary | ICD-10-CM | POA: Insufficient documentation

## 2014-04-21 NOTE — Progress Notes (Signed)
Patient ID: Daniel Singleton, male   DOB: [birth year], 73 y.o.   MRN: [id number]        HPI:  Daniel Singleton is a 73 year old gentleman who is followed Dr. [name] for primary medical care.  He presents for seven-month followup cardiology evaluation and followup of his recent presumed TIA.  Daniel Singleton is a gentleman with a history of hypertension, hyperlipidemia, COPD, prior asbestos exposure, diabetes mellitus , and recently diagnosed prostate cancer. In January 2006, he underwent CABG surgery x4  in [facility] by Dr. Zorita Pang. I do not have the specifics of his bypass grafts. He states that last year, he did undergo a stress test in [place] after he had experienced some recurrent chest pain episodes and a cardiac catheterization which showed mild blockages.  He also has a history of obstructive sleep apnea on CPAP therapy with 3 L of oxygen.  Daniel Singleton does admit to some shortness of breath.  He is unaware of tachycardia palpitations. He does have hyperlipidemia. Apparently he had been on isosorbide mononitrate therapy but stopped taking this because of libido concerns and potential need for medications for erectile function.  He did undergo an echo Doppler study which was done on 04/03/2013 which showed an ejection fraction of 55-60%. Mild tricuspid regurgitation. He did have grade 1 diastolic dysfunction. When I saw him, I recommended that we perform advanced lipid testing with NMR lipoprotein of since he has had low HDL levels and her recent cholesterol was 185 triglycerides 153 HDL 31 and LDL 123. At that time, I also started him on Ranexa 500 mg twice a day since he had discontinued his isosorbide. I added Zetia 10 mg to his atorvastatin and recommended that he undergo a followup with NMR lipoprotein assessment.  His NMR study was markedly abnormal. Specifically, his total cholesterol was 187 triglycerides 348 HDL 32 LDL cholesterol 85. However, he had markedly increased goal  LDL particles at 1739 status LDL particle number was markedly increased at 2463. The patient tells me he did develop some myalgias on Lipitor and therefore for approximately the last 3-4 weeks completely discontinued a torus that in therapy.  He was  hospitalized on December 26 through 08/21/2013 abdominal discomfort due to partial small bowel obstruction. He had experienced nausea and vomiting with diarrhea for 4 days prior to his admission and was found to have a partial mid-to high grade small bowel obstruction with a transition point in the right lower quadrant. He was cleared for surgery and if surgery was to be done he was to stop his Plavix and continue aspirin. Fortunately, his bowel obstruction resolved with medical management and surgical intervention was not necessary.  I'd seen him in January 2015 at which time he was off Plavix therapy and was not having any anginal symptoms on his current medical regimen.  He was recently hospitalized on 03/19/2014 through 03/21/2014 with ataxia.  He had complaints of vertigo with nausea, vomiting, double vision, numbness, and weakness.  A CT of the brain did not show acute intracranial abnormalities, but did show chronic atrophy and small vessel ischemic changes.  A 2-D echo Doppler study showed an EF of 45-50% without cardiac source of emboli.  Carotid studies revealed mild bilateral plaque with narrowing of 1-39% range.  Antegrade flow is vertebral artery.  Deployment of the brachial artery demonstrate triphasic waveforms.  He was felt possibly to have a posterior circulation TIA.  His aspirin 81 mg was increased to 325 mg.  He presents now for Cardiologic followup evaluation.   Past Medical History  Diagnosis Date  . Hypertension   . Emphysema   . Diabetes   . Hypercholesteremia   . PONV (postoperative nausea and vomiting)   . Angina at rest   . Coronary artery disease     recently started seeing Dr. Claiborne Billings   . Arthritis   . GERD (gastroesophageal  reflux disease)   . Elevated PSA   . Sleep apnea     c pap with oxygen at 2.5 L  . Prostate cancer 05/23/13    gleason 3+4=7, volume 31 cc  . Asthma   . Depression   . Anxiety   . Hx of radiation therapy 09/20/13- 11/14/13    prostate 7800 cGy 40 sessions, seminal vesicles 5600 cGy in 40 sessions    Past Surgical History  Procedure Laterality Date  . Heart bypass  2006    Quad  . Degloving rt leg injury  2000    had skin grafts  . Hernia repair  2008    x 3 w/mesh, bilat inguinla, umbilical  . Back surgery  2006    discectomy lumbar  . Tonsillectomy      w/adenoidectomy  . Prostate biopsy N/A 05/23/2013    Procedure: BIOPSY TRANSRECTAL ULTRASONIC PROSTATE (TUBP);  Surgeon: Molli Hazard, MD;  Location: [facility];  Service: Urology;  Laterality: N/A;  prostate nerve block  . Back surgery    . Foot surgery    . Back surgery      No Known Allergies  Current Outpatient Prescriptions  Medication Sig Dispense Refill  . albuterol (PROVENTIL HFA;VENTOLIN HFA) 108 (90 BASE) MCG/ACT inhaler Inhale 2 puffs into the lungs every 6 (six) hours as needed for wheezing.  1 Inhaler  3  . ALPRAZolam (XANAX) 0.25 MG tablet Take 0.25 mg by mouth daily.      Marland Kitchen aspirin 81 MG tablet Take 81 mg by mouth daily.      . DULoxetine (CYMBALTA) 60 MG capsule Take 60 mg by mouth daily.      Marland Kitchen ezetimibe (ZETIA) 10 MG tablet Take 1 tablet (10 mg total) by mouth every morning.  90 tablet  2  . meclizine (ANTIVERT) 25 MG tablet Take 1 tablet (25 mg total) by mouth 3 (three) times daily as needed.  30 tablet  0  . Melatonin 10 MG CAPS Take 10 mg by mouth daily.      . metoprolol succinate (TOPROL XL) 25 MG 24 hr tablet Take 1 tablet (25 mg total) by mouth daily.  90 tablet  1  . nitroGLYCERIN (NITROSTAT) 0.3 MG SL tablet Place 0.3 mg under the tongue every 5 (five) minutes as needed for chest pain.      Marland Kitchen omega-3 acid ethyl esters (LOVAZA) 1 G capsule Take 1 capsule (1 g total) by mouth daily.  90 capsule   2  . ranolazine (RANEXA) 500 MG 12 hr tablet Take 1 tablet (500 mg total) by mouth 2 (two) times daily.  180 tablet  2  . rosuvastatin (CRESTOR) 20 MG tablet Take 1 tablet (20 mg total) by mouth daily.  90 tablet  1  . clopidogrel (PLAVIX) 75 MG tablet Take 1 tablet (75 mg total) by mouth daily.  90 tablet  3   No current facility-administered medications for this visit.    Social history is notable in that he is married to his 2 children ages 25 and 46. He smoked one pack of  cigarettes per day until 2000. He does drink occasional alcohol. He does walk  Family History  Problem Relation Age of Onset  . Heart disease Mother   . Heart attack Mother   . Cancer Sister   . Depression Sister    ROS General: Negative; No fevers, chills, or night sweats;  HEENT: Negative; No changes in vision or hearing, sinus congestion, difficulty swallowing Pulmonary: Positive for asbestosis exposure No cough, wheezing, shortness of breath, hemoptysis Cardiovascular: Negative; No chest pain, presyncope, syncope, palpitations GI: Negative; No nausea, vomiting, diarrhea, or abdominal pain GU: Positive for prostate CA No dysuria, hematuria, or difficulty voiding Musculoskeletal: Remote degloving injury; no myalgias, joint pain, or weakness Hematologic/Oncology: Negative; no easy bruising, bleeding Endocrine: Negative; no heat/cold intolerance; no diabetes Neuro: See history of present illness, recent ataxia, probable posterior circulation TIA Skin: Negative; No rashes or skin lesions Psychiatric: Negative; No behavioral problems, depression Sleep: Positive for obstructive sleep apnea with supplemental 3 L oxygen; No residual snoring, daytime sleepiness, hypersomnolence, bruxism, restless legs, hypnogognic hallucinations, no cataplexy Other comprehensive 14 point system review is negative.   PE BP 128/82  Pulse 71  Ht 5\' 10"  (1.778 m)  Wt 208 lb 9.6 oz (94.62 kg)  BMI 29.93 kg/m2 General: Alert,  oriented, no distress.  Skin: normal turgor, no rashes HEENT: Normocephalic, atraumatic. Pupils round and reactive; sclera anicteric; Fundi no hemorrhages or exudates Nose without nasal septal hypertrophy Mouth/Parynx benign; Mallinpatti scale 3 Neck: No JVD, no carotid bruits Lungs: clear to ausculatation and percussion; no wheezing or rales Chest wall: No tenderness to palpation Heart: RRR, s1 s2 normal 1/6 systolic murmur; no diastolic murmur.  No rubs, thrills or heaves Abdomen: soft, nontender; no hepatosplenomehaly, BS+; abdominal aorta nontender and not dilated by palpation. Back: No CVA tenderness. Pulses 2+ Extremities: no clubbinbg cyanosis or edema, Homan's sign negative  Neurologic: grossly nonfocal Psychological: Normal affect and mood; normal cognition  ECG (independently read by me): Sinus rhythm with sinus arrhythmia.  Nonspecific T changes.  QTc interval 421 ms.  08/30/2013 ECG independently read by me today shows normal sinus rhythm with sinus arrhythmia with a ventricular rate in the 80s. They're nonspecific ST-T changes.  Prior ECG in October 2014: Sinus rhythm with mild sinus arrhythmia at 60 beats per minute. PR interval 172 ms. QTC interval 384 ms.  LABS:  BMET    Component Value Date/Time   NA 139 03/20/2014 0438     Hepatic Function Panel     Component Value Date/Time   PROT 6.2 03/20/2014 0438     CBC    Component Value Date/Time   WBC 8.0 03/20/2014 0438     BNP No results found for this basename: probnp    Lipid Panel     Component Value Date/Time   CHOL 123 03/20/2014 0435     RADIOLOGY: No results found.   ASSESSMENT AND PLAN: Daniel Singleton is a 73 year old gentleman with a history of diabetes mellitus, hyperlipidemia, obstructive sleep apnea on CPAP therapy with supplemental oxygen, as well as COPD. He has a prior tobacco history but he quit on 01/17/1999. He is status post CABG surgery x4 in January 2006 and last year  catheterization demonstrated some mild blockages. He has felt improved since taking ranolazine 500 mg twice a day. A nNMR profile revealed a markedly abnormal number of small LDL particles contributing to his significant elevation of LDL particles number and is now on Crestor 20 mg,Zetia 10 mg as well as Niaspan 1000  mg.  Prior to reinitiating therapy, his LDL particle number was 2463. His most recent LDL particle numbers now markedly improved at 801. LDL cholesterol is 53. Triglycerides are markedly improved from 348-96 and his VLDL size has also improved from 56 to 48.  I last saw him, his pulse was increased and he did have sinus arrhythmia.  He has tolerated reinitiation of low-dose Toprol-XL 25 mg daily.  When he was admitted with a small bowel obstruction, his Plavix was discontinued in anticipation of possible bowel surgery.  He never required bowel surgery.  He does not have any stents in his coronary arteries.  However, he recently developed TIA symptomatology while on low-dose aspirin.  His aspirin dose was increased to 325.  While he was hospitalized Socrates trial with Kary Kos was discussed with he and his wife, but apparently they declined to participate in this.  With his recent neurologic event on aspirin therapy, I have  recommended resumption of dual antiplatelet therapy and will reinitiate Plavix 75 mg, and reduce his aspirin dose back to 81 mg. He will have a neurology evaluation.  I will see him in 4 months for cardiology followup evaluation.  Troy Sine, MD, [facility] 04/21/2014 11:23 AM

## 2014-05-02 ENCOUNTER — Encounter: Payer: Self-pay | Admitting: Neurology

## 2014-05-02 ENCOUNTER — Ambulatory Visit (INDEPENDENT_AMBULATORY_CARE_PROVIDER_SITE_OTHER): Payer: Medicare Other | Admitting: Neurology

## 2014-05-02 VITALS — BP 125/75 | HR 82 | Ht 69.5 in | Wt 209.8 lb

## 2014-05-02 DIAGNOSIS — C61 Malignant neoplasm of prostate: Secondary | ICD-10-CM | POA: Diagnosis not present

## 2014-05-02 DIAGNOSIS — G450 Vertebro-basilar artery syndrome: Secondary | ICD-10-CM

## 2014-05-02 DIAGNOSIS — I2510 Atherosclerotic heart disease of native coronary artery without angina pectoris: Secondary | ICD-10-CM

## 2014-05-02 DIAGNOSIS — I25810 Atherosclerosis of coronary artery bypass graft(s) without angina pectoris: Secondary | ICD-10-CM

## 2014-05-02 NOTE — Patient Instructions (Signed)
-   continue ASA and plavix for stroke prevention. From stroke standpoint, dual antiplatelet not more than 3 months, but if dual antiplatelets are needed for cardiac prevention, we are OK with that - continue crestor for stroke prevention - continue to follow up with PCP for stroke risk factor modification - follow up in 3 months.

## 2014-05-02 NOTE — Progress Notes (Signed)
STROKE NEUROLOGY FOLLOW UP NOTE  NAME: Daniel Singleton DOB: 11/16/1940  REASON FOR VISIT: stroke follow up HISTORY FROM: pt and wife and chart  Today we had the pleasure of seeing [name] in follow-up at our Neurology Clinic. Pt was accompanied by wife.   History Summary Daniel Singleton is a 73 y.o. male with history of CAD status post CABG, hypertension, COPD, hyperlipidemia, OSA, prostate cancer was admitted on 03/19/14 due to acute onset vertigo, nausea and vomiting, feeling hot, sweating, ringing in the ears, not able to walk. Glucose was 137. In the ER, patient was found to have nystagmus and difficulty ambulating. CT of the head was done which did not show anything acute. Denies any difficulty speaking swallowing. Denies any headache visual symptoms or any difficulty hearing or tinnitus. Patient is able to move all extremities 5 x 5. MRI did not show acute stroke. In hospital, he still has dizziness but not vertigo, unsteady gait, taking meclizine. Symptoms gradually better and he was discharged on 3rd day.  He was on ASA and plavix in the past, but in 07/2013, he was off plavix for abdominal procedure but did not put back on. He was on ASA only for the recent admission. After admission, he was discharged on aspirin full dose.  Interval History During the interval time, the patient has been doing well.  His dizziness resolved. He followup with his cardiologist, and he was put back on dural antiplatelet with aspirin 81 and Plavix 75. He is currently on 4 medication for his hyperlipidemia, including lovaza, Crestor, zetia and niacin.   REVIEW OF SYSTEMS: Full 14 system review of systems performed and notable only for those listed below and in HPI above, all others are negative:  Runny nose, blurry vision, heat intolerance, restless leg, apnea, snoring, sleep talking, acting out of dream, bladder intolerance, joint pain, neck pain, muscle cramps, memory loss, dizziness, headache,  weakness, depression, nurse/anxious.  The following represents the patient's updated allergies and side effects list: No Known Allergies  Labs since last visit of relevance include the following: Results for orders placed during the hospital encounter of 03/19/14  CBC      Result Value Ref Range   WBC 8.4  4.0 - 10.5 K/uL   RBC 4.21 (*) 4.22 - 5.81 MIL/uL   Hemoglobin 13.1  13.0 - 17.0 g/dL   HCT 37.5 (*) 39.0 - 52.0 %   MCV 89.1  78.0 - 100.0 fL   MCH 31.1  26.0 - 34.0 pg   MCHC 34.9  30.0 - 36.0 g/dL   RDW 13.8  11.5 - 15.5 %   Platelets    150 - 400 K/uL   Value: PLATELET CLUMPS NOTED ON SMEAR, COUNT APPEARS DECREASED  BASIC METABOLIC PANEL      Result Value Ref Range   Sodium 138  137 - 147 mEq/L   Potassium 4.2  3.7 - 5.3 mEq/L   Chloride 98  96 - 112 mEq/L   CO2 28  19 - 32 mEq/L   Glucose, Bld 173 (*) 70 - 99 mg/dL   BUN 12  6 - 23 mg/dL   Creatinine, Ser 1.01  0.50 - 1.35 mg/dL   Calcium 8.9  8.4 - 10.5 mg/dL   GFR calc non Af Amer 73 (*) >90 mL/min   GFR calc Af Amer 84 (*) >90 mL/min   Anion gap 12  5 - 15  COMPREHENSIVE METABOLIC PANEL      Result  Value Ref Range   Sodium 139  137 - 147 mEq/L   Potassium 4.1  3.7 - 5.3 mEq/L   Chloride 99  96 - 112 mEq/L   CO2 24  19 - 32 mEq/L   Glucose, Bld 152 (*) 70 - 99 mg/dL   BUN 11  6 - 23 mg/dL   Creatinine, Ser 0.90  0.50 - 1.35 mg/dL   Calcium 8.9  8.4 - 10.5 mg/dL   Total Protein 6.2  6.0 - 8.3 g/dL   Albumin 3.5  3.5 - 5.2 g/dL   AST 19  0 - 37 U/L   ALT 18  0 - 53 U/L   Alkaline Phosphatase 46  39 - 117 U/L   Total Bilirubin 0.3  0.3 - 1.2 mg/dL   GFR calc non Af Amer 84 (*) >90 mL/min   GFR calc Af Amer >90  >90 mL/min   Anion gap 16 (*) 5 - 15  CBC WITH DIFFERENTIAL      Result Value Ref Range   WBC 8.0  4.0 - 10.5 K/uL   RBC 4.40  4.22 - 5.81 MIL/uL   Hemoglobin 13.2  13.0 - 17.0 g/dL   HCT 38.8 (*) 39.0 - 52.0 %   MCV 88.2  78.0 - 100.0 fL   MCH 30.0  26.0 - 34.0 pg   MCHC 34.0  30.0 - 36.0 g/dL     RDW 13.9  11.5 - 15.5 %   Platelets 130 (*) 150 - 400 K/uL   Neutrophils Relative % 81 (*) 43 - 77 %   Neutro Abs 6.5  1.7 - 7.7 K/uL   Lymphocytes Relative 10 (*) 12 - 46 %   Lymphs Abs 0.8  0.7 - 4.0 K/uL   Monocytes Relative 8  3 - 12 %   Monocytes Absolute 0.6  0.1 - 1.0 K/uL   Eosinophils Relative 1  0 - 5 %   Eosinophils Absolute 0.1  0.0 - 0.7 K/uL   Basophils Relative 0  0 - 1 %   Basophils Absolute 0.0  0.0 - 0.1 K/uL  TSH      Result Value Ref Range   TSH 1.380  0.350 - 4.500 uIU/mL  HEMOGLOBIN A1C      Result Value Ref Range   Hemoglobin A1C 6.5 (*) <5.7 %   Mean Plasma Glucose 140 (*) <117 mg/dL  LIPID PANEL      Result Value Ref Range   Cholesterol 123  0 - 200 mg/dL   Triglycerides 78  <150 mg/dL   HDL 42  >39 mg/dL   Total CHOL/HDL Ratio 2.9     VLDL 16  0 - 40 mg/dL   LDL Cholesterol 65  0 - 99 mg/dL  I-STAT TROPOININ, ED      Result Value Ref Range   Troponin i, poc 0.00  0.00 - 0.08 ng/mL   Comment 3             The neurologically relevant items on the patient's problem list were reviewed on today's visit.  Neurologic Examination  A problem focused neurological exam (12 or more points of the single system neurologic examination, vital signs counts as 1 point, cranial nerves count for 8 points) was performed.  Blood pressure 125/75, pulse 82, height 5' 9.5" (1.765 m), weight 209 lb 12.8 oz (95.165 kg).  General - Well nourished, well developed, in no apparent distress.  Ophthalmologic - not able to see through.  Cardiovascular - Regular  rate and rhythm with no murmur.  Mental Status -  Level of arousal and orientation to time, place, and person were intact. Language including expression, naming, repetition, comprehension was assessed and found intact.  Cranial Nerves II - XII - II - Visual field intact OU. III, IV, VI - Extraocular movements intact, no nystagmus. V - Facial sensation intact bilaterally. VII - Facial movement intact  bilaterally. VIII - Hearing & vestibular intact bilaterally, no nystagmus. X - Palate elevates symmetrically. XI - Chin turning & shoulder shrug intact bilaterally. XII - Tongue protrusion intact.  Motor Strength - The patient's strength was normal in all extremities and pronator drift was absent.  Bulk was normal and fasciculations were absent.   Motor Tone - Muscle tone was assessed at the neck and appendages and was normal.  Reflexes - The patient's reflexes were 1+ in all extremities and he had no pathological reflexes.  Sensory - Light touch, temperature/pinprick were assessed and were normal.    Coordination - The patient had normal movements in the hands and feet with no ataxia or dysmetria.  Tremor was absent.  Gait and Station - slow cautious gait, but steady.  Dix-Halpik test negative.   Data reviewed: I personally reviewed the images and agree with the radiology interpretations.  CT of the brain 03/20/2014 No acute intracranial abnormalities. Chronic atrophy and small vessel ischemic changes.  MRI of the brain 03/20/2014 1. Normal MRI appearance of the brain for age. 2. Minimal maxillary sinus disease. 3. Fluid in the mastoid air cells bilaterally. No obstructing nasopharyngeal lesion is present.  MRA of the brain 03/20/2014 Normal variant MRA circle of Willis without evidence for significant proximal stenosis, aneurysm, or branch vessel occlusion.  Carotid Doppler Bilateral: 1-39% ICA stenosis. Vertebral artery flow is antegrade.  2D Echocardiogram EF 55-60% with no source of embolus.  CXR 03/19/2014 No acute abnormalities.  EKG normal EKG, normal sinus rhythm  LDL was 65 and A1c 6.5  Assessment: As you may recall, he is a 73 y.o. Caucasian male with PMH of CAD status post CABG, hypertension, COPD, hyperlipidemia, OSA, prostate cancer was admitted on 03/19/14 due to acute onset vertigo, nausea and vomiting, feeling hot, sweating, ringing in the ears, not able to walk. MRI was  negative for any is acute stroke. Stroke workup unrevealing. Due to multiple stroke risk factors, his symptoms could be posterior circulation TIA versus DWI negative stroke. However, BPPV is not uncommon in elderly patient and sometimes can lasting for days. He Dix-halpik testing is negative today. We'll continue stroke prevention and a stroke risk factor modification.  Plan:  - Continue aspirin Plavix for stroke and cardiac prevention. - Continue Crestor for stroke prevention - Followup with primary care physician and cardiologist for stroke risk factor modification - RTC in 3 months   No orders of the defined types were placed in this encounter.    Patient Instructions  - continue ASA and plavix for stroke prevention. From stroke standpoint, dual antiplatelet not more than 3 months, but if dual antiplatelets are needed for cardiac prevention, we are OK with that - continue crestor for stroke prevention - continue to follow up with PCP for stroke risk factor modification - follow up in 3 months.    Rosalin Hawking, MD PhD [facility] [address], [place], [place] [address] [phone]

## 2014-05-13 ENCOUNTER — Ambulatory Visit (INDEPENDENT_AMBULATORY_CARE_PROVIDER_SITE_OTHER): Payer: Medicare Other | Admitting: Internal Medicine

## 2014-05-13 ENCOUNTER — Encounter: Payer: Self-pay | Admitting: Internal Medicine

## 2014-05-13 VITALS — BP 128/80 | HR 78 | Ht 70.0 in | Wt 209.6 lb

## 2014-05-13 DIAGNOSIS — I2510 Atherosclerotic heart disease of native coronary artery without angina pectoris: Secondary | ICD-10-CM | POA: Diagnosis not present

## 2014-05-13 DIAGNOSIS — J438 Other emphysema: Secondary | ICD-10-CM

## 2014-05-13 DIAGNOSIS — Z77090 Contact with and (suspected) exposure to asbestos: Secondary | ICD-10-CM

## 2014-05-13 DIAGNOSIS — G4733 Obstructive sleep apnea (adult) (pediatric): Secondary | ICD-10-CM | POA: Diagnosis not present

## 2014-05-13 DIAGNOSIS — Z23 Encounter for immunization: Secondary | ICD-10-CM

## 2014-05-13 DIAGNOSIS — J432 Centrilobular emphysema: Secondary | ICD-10-CM

## 2014-05-13 NOTE — Progress Notes (Signed)
55 yoM self referred for  COPD with emphysema diagnosed 3419, complicated by CAD/CABG 4V, DM.       Here with wife Smoked one pack per day until quitting in 2000. Retired from [employer] with associated dust. 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 He says this was associated with asbestos exposure from brake dust. Issues are in legal consideration and he notes that no diagnosis has been made. Has had pneumonia twice, and pneumonia vaccine. History of sleep apnea diagnosis but quit CPAP. Wife confirms he snores wearing a chin strap and sleeping in a recliner. Significant injury at work 01/17/1999, broken tibia and "degloving" of R  lower leg.  01/10/13- self referred for  COPD with emphysema diagnosed 3790, OSA, complicated by CAD/CABG 4V, DM, hx major trauma leg.       Here with wife Mowing the yard with mask in dusty but will still cough. Wife says if he lies down a recliner he holds his breath saturation will drop to 86%.he says he has been wearing his CPAP again/Advanced, but she describes apneas when he is sleeping without it.  PFT 01/01/2013: Minimal obstructive airways disease, air trapping with increased residual volume, mild response to bronchodilator, diffusion slightly reduced. FVC 4.11/96%, FEV1 2.59/82%, FEV1/FVC 0.63/85%, FEF 25-75% 1.63/68%. TLC 100%, DLCO 76%. CXR 11/23/12 IMPRESSION:  Underlying emphysema. No edema or consolidation.  Original Report Authenticated By: Lowella Grip, M.D.  03/05/13- self referred for  COPD with emphysema diagnosed 2409, OSA, complicated by CAD/CABG 4V, DM, hx major trauma leg.       Here with wife FOLLOWS FOR: pt reports breathing is doing well-- states concentrator is making a "racket and is on its last leg"-- denies any other concerns at this time. CPAP Autopap/ Advanced Mask hurts bridge of nose if he stretches it on too tightly. Oxygen concentrator is getting loud. Compliance okay but inadequate control  based on download. Needs broader pressure range for autotitration.  09/04/13- 67 yoM former smoker self referred for  COPD with emphysema, hx asbestos exposure, OSA, complicated by CAD/CABG 4V, DM, hx major trauma leg.       Here with wife CPAP AutoPAP Advanced- not using-blows too hard. Does wearing oxygen 3 L/Advanced for sleep. Wife says she rarely notices apneas now when he is not wearing CPAP. FOLLOWS FOR: Pt states breathing is doing well since last visit; recent hospital for intestine blockage. Pending XRT for prostate cancer  11/20/13- 72 yoM former smoker self referred for  COPD with emphysema, hx asbestos exposure, OSA, complicated by CAD/CABG 4V, DM, hx major trauma leg, prostate Ca.       Here with wife FOLLOWS FOR: wears CPAPAuto or 10?/ Advanced every night for about 6 hours; Pt states [facility] did ONO since visit off CPAP- seeking report Pressure working well for patient; DME is [facility], Still using oxygen at 3 L. No recent nitroglycerin. Not using inhalers. Recent hospital for small bowel obstruction.   05/13/14- 86 yoM former smoker self referred for  COPD with emphysema, hx asbestos exposure, OSA, complicated by CAD/CABG 4V, DM, hx major trauma leg, prostate Ca.       Here with wife follows for: Pt states he was off CPAP auto/+ O2 3L Advanced for several months while getting XRT for prostate cancer, but started back x 2 weeks ago. He is wearing it every night for about 8 hours a night.  Sleeps in a recliner. COPD: Pt denies any increase in SOB,  cough at this time.  CXR 03/19/14 IMPRESSION:  No acute abnormalities.  Electronically Signed  By: Rozetta Nunnery M.D.  On: 03/19/2014 20:24  ROS-see HPI Constitutional:   No-   weight loss, night sweats, fevers, chills, +fatigue, lassitude. HEENT:   No-  headaches, difficulty swallowing, tooth/dental problems, sore throat,       No-  sneezing, itching, ear ache, nasal congestion, post nasal drip,  CV:  No-   chest pain, orthopnea, PND, swelling in  lower extremities, anasarca, dizziness, palpitations Resp: + shortness of breath with exertion or at rest.              No-   productive cough,  No non-productive cough,  No- coughing up of blood.              No-   change in color of mucus.  No- wheezing.   Skin: No-   rash or lesions. GI:  No-   heartburn, indigestion, abdominal pain, nausea, vomiting, GU:  MS:  No-   joint pain or swelling.  . Neuro-     nothing unusual Psych:  No- change in mood or affect. + depression or anxiety.  No memory loss.  OBJ- Physical Exam General- Alert, Oriented, Affect-appropriate, Distress- none acute. overweight Skin- rash-none, lesions- none, excoriation- none Lymphadenopathy- none Head- atraumatic            Eyes- Gross vision intact, PERRLA, conjunctivae and secretions clear            Ears- Hearing, canals-normal            Nose-  Clear, no-Septal dev, mucus, polyps, erosion, perforation             Throat- Mallampati II , mucosa clear , drainage- none, tonsils- atrophic Neck- flexible , trachea midline, no stridor , thyroid nl, carotid no bruit Chest - symmetrical excursion , unlabored           Heart/CV- RRR , no murmur , no gallop  , no rub, nl s1 s2                           - JVD- none , edema- none, stasis changes- none, varices- none           Lung- +diminished but clear to P&A, wheeze- none, cough- none , dullness-none, rub- none           Chest wall-  Abd-  Br/ Gen/ Rectal- Not done, not indicated Extrem- cyanosis- none, clubbing, none, atrophy- none, strength- nl. -+cane. Support stockings Neuro- grossly intact to observation

## 2014-05-13 NOTE — Patient Instructions (Signed)
Pneumovax-23 pneumonia vaccine  Flu vax  We can continue CPAP Auto/ O2 3L/ Advanced  Please call as needed

## 2014-05-25 ENCOUNTER — Encounter: Payer: Self-pay | Admitting: Internal Medicine

## 2014-05-25 NOTE — Assessment & Plan Note (Signed)
Sleeping in recliner. He understands importance of CPAP compliance

## 2014-05-25 NOTE — Assessment & Plan Note (Signed)
Plan-flu and Pneumovax vaccines. Prevnar next year, before flu shot season

## 2014-05-25 NOTE — Assessment & Plan Note (Signed)
Occasional chest x-ray for surveillance

## 2014-06-03 ENCOUNTER — Other Ambulatory Visit: Payer: Self-pay | Admitting: Cardiovascular Disease

## 2014-06-03 NOTE — Telephone Encounter (Signed)
Rx was sent to pharmacy electronically. 

## 2014-07-24 DIAGNOSIS — R350 Frequency of micturition: Secondary | ICD-10-CM | POA: Diagnosis not present

## 2014-07-24 DIAGNOSIS — R351 Nocturia: Secondary | ICD-10-CM | POA: Diagnosis not present

## 2014-07-24 DIAGNOSIS — C61 Malignant neoplasm of prostate: Secondary | ICD-10-CM | POA: Diagnosis not present

## 2014-08-01 DIAGNOSIS — C61 Malignant neoplasm of prostate: Secondary | ICD-10-CM | POA: Diagnosis not present

## 2014-08-01 DIAGNOSIS — N5201 Erectile dysfunction due to arterial insufficiency: Secondary | ICD-10-CM | POA: Diagnosis not present

## 2014-08-01 DIAGNOSIS — R350 Frequency of micturition: Secondary | ICD-10-CM | POA: Diagnosis not present

## 2014-08-06 ENCOUNTER — Encounter: Payer: Self-pay | Admitting: Neurology

## 2014-08-06 ENCOUNTER — Ambulatory Visit (INDEPENDENT_AMBULATORY_CARE_PROVIDER_SITE_OTHER): Payer: Medicare Other | Admitting: Neurology

## 2014-08-06 VITALS — BP 106/67 | HR 72 | Ht 70.0 in | Wt 208.6 lb

## 2014-08-06 DIAGNOSIS — G450 Vertebro-basilar artery syndrome: Secondary | ICD-10-CM | POA: Diagnosis not present

## 2014-08-06 DIAGNOSIS — E785 Hyperlipidemia, unspecified: Secondary | ICD-10-CM | POA: Diagnosis not present

## 2014-08-06 DIAGNOSIS — I25810 Atherosclerosis of coronary artery bypass graft(s) without angina pectoris: Secondary | ICD-10-CM | POA: Diagnosis not present

## 2014-08-06 DIAGNOSIS — I2510 Atherosclerotic heart disease of native coronary artery without angina pectoris: Secondary | ICD-10-CM

## 2014-08-06 NOTE — Patient Instructions (Addendum)
-   continue ASA and plavix for stroke prevention - continue crestor for stroke prevention and cholesterol control - check BP at home - Follow up with your primary care physician for stroke risk factor modification. Recommend maintain blood pressure goal <130/80, diabetes with hemoglobin A1c goal below 6.5% and lipids with LDL cholesterol goal below 70 mg/dL.  - follow up in 6 months.

## 2014-08-06 NOTE — Progress Notes (Signed)
STROKE NEUROLOGY FOLLOW UP NOTE  NAME: Daniel Singleton DOB: [birth year]  REASON FOR VISIT: stroke follow up HISTORY FROM: pt and wife and chart  Today we had the pleasure of seeing Daniel Singleton in follow-up at our Neurology [facility]. Pt was accompanied by wife.   History Summary Daniel Singleton is a 73 y.o. male with history of CAD status post CABG, hypertension, COPD, hyperlipidemia, OSA, prostate cancer was admitted on 03/19/14 due to acute onset vertigo, nausea and vomiting, feeling hot, sweating, ringing in the ears, not able to walk. Glucose was 137. In the ER, patient was found to have nystagmus and difficulty ambulating. CT of the head was done which did not show anything acute. Denies any difficulty speaking swallowing. Denies any headache visual symptoms or any difficulty hearing or tinnitus. Patient is able to move all extremities 5 x 5. MRI did not show acute stroke. In hospital, he still has dizziness but not vertigo, unsteady gait, taking meclizine. Symptoms gradually better and he was discharged on 3rd day. He was on ASA and plavix in the past, but in 07/2013, he was off plavix for abdominal procedure but did not put back on. He was on ASA only for the recent admission. After admission, he was discharged on aspirin full dose.  Follow up 05/02/14 - the patient has been doing well.  His dizziness resolved. He followup with his cardiologist, and he was put back on dural antiplatelet with aspirin 81 and Plavix 75. He is currently on 4 medication for his hyperlipidemia, including lovaza, Crestor, zetia and niacin.   Interval History During the interval time, he has been doing well. No recurrent symptoms. Continued on ASA and plavix as well as crestor. No specific complaint. BP at home 110-120/60-70. Today in clinic 106/67.   REVIEW OF SYSTEMS: Full 14 system review of systems performed and notable only for those listed below and in HPI above, all others are negative:  Fatigue, SOB, CP,  apnea, daytime sleepiness, joint pain, weakness, depression.  The following represents the patient's updated allergies and side effects list: Allergies  Allergen Reactions  . Niacin And Related Itching    Labs since last visit of relevance include the following: Results for orders placed or performed during the hospital encounter of 03/19/14  CBC  Result Value Ref Range   WBC 8.4 4.0 - 10.5 K/uL   RBC 4.21 (L) 4.22 - 5.81 MIL/uL   Hemoglobin 13.1 13.0 - 17.0 g/dL   HCT 37.5 (L) 39.0 - 52.0 %   MCV 89.1 78.0 - 100.0 fL   MCH 31.1 26.0 - 34.0 pg   MCHC 34.9 30.0 - 36.0 g/dL   RDW 13.8 11.5 - 15.5 %   Platelets  150 - 400 K/uL    PLATELET CLUMPS NOTED ON SMEAR, COUNT APPEARS DECREASED  Basic metabolic panel  Result Value Ref Range   Sodium 138 137 - 147 mEq/L   Potassium 4.2 3.7 - 5.3 mEq/L   Chloride 98 96 - 112 mEq/L   CO2 28 19 - 32 mEq/L   Glucose, Bld 173 (H) 70 - 99 mg/dL   BUN 12 6 - 23 mg/dL   Creatinine, Ser 1.01 0.50 - 1.35 mg/dL   Calcium 8.9 8.4 - 10.5 mg/dL   GFR calc non Af Amer 73 (L) >90 mL/min   GFR calc Af Amer 84 (L) >90 mL/min   Anion gap 12 5 - 15  Comprehensive metabolic panel  Result Value Ref Range  Sodium 139 137 - 147 mEq/L   Potassium 4.1 3.7 - 5.3 mEq/L   Chloride 99 96 - 112 mEq/L   CO2 24 19 - 32 mEq/L   Glucose, Bld 152 (H) 70 - 99 mg/dL   BUN 11 6 - 23 mg/dL   Creatinine, Ser 0.90 0.50 - 1.35 mg/dL   Calcium 8.9 8.4 - 10.5 mg/dL   Total Protein 6.2 6.0 - 8.3 g/dL   Albumin 3.5 3.5 - 5.2 g/dL   AST 19 0 - 37 U/L   ALT 18 0 - 53 U/L   Alkaline Phosphatase 46 39 - 117 U/L   Total Bilirubin 0.3 0.3 - 1.2 mg/dL   GFR calc non Af Amer 84 (L) >90 mL/min   GFR calc Af Amer >90 >90 mL/min   Anion gap 16 (H) 5 - 15  CBC with Differential  Result Value Ref Range   WBC 8.0 4.0 - 10.5 K/uL   RBC 4.40 4.22 - 5.81 MIL/uL   Hemoglobin 13.2 13.0 - 17.0 g/dL   HCT 38.8 (L) 39.0 - 52.0 %   MCV 88.2 78.0 - 100.0 fL   MCH 30.0 26.0 - 34.0 pg    MCHC 34.0 30.0 - 36.0 g/dL   RDW 13.9 11.5 - 15.5 %   Platelets 130 (L) 150 - 400 K/uL   Neutrophils Relative % 81 (H) 43 - 77 %   Neutro Abs 6.5 1.7 - 7.7 K/uL   Lymphocytes Relative 10 (L) 12 - 46 %   Lymphs Abs 0.8 0.7 - 4.0 K/uL   Monocytes Relative 8 3 - 12 %   Monocytes Absolute 0.6 0.1 - 1.0 K/uL   Eosinophils Relative 1 0 - 5 %   Eosinophils Absolute 0.1 0.0 - 0.7 K/uL   Basophils Relative 0 0 - 1 %   Basophils Absolute 0.0 0.0 - 0.1 K/uL  TSH  Result Value Ref Range   TSH 1.380 0.350 - 4.500 uIU/mL  Hemoglobin A1c  Result Value Ref Range   Hgb A1c MFr Bld 6.5 (H) <5.7 %   Mean Plasma Glucose 140 (H) <117 mg/dL  Lipid panel  Result Value Ref Range   Cholesterol 123 0 - 200 mg/dL   Triglycerides 78 <150 mg/dL   HDL 42 >39 mg/dL   Total CHOL/HDL Ratio 2.9 RATIO   VLDL 16 0 - 40 mg/dL   LDL Cholesterol 65 0 - 99 mg/dL  I-Stat Troponin, ED  Result Value Ref Range   Troponin i, poc 0.00 0.00 - 0.08 ng/mL   Comment 3            The neurologically relevant items on the patient's problem list were reviewed on today's visit.  Neurologic Examination  A problem focused neurological exam (12 or more points of the single system neurologic examination, vital signs counts as 1 point, cranial nerves count for 8 points) was performed.  Blood pressure 106/67, pulse 72, height 5\' 10"  (1.778 m), weight 208 lb 9.6 oz (94.62 kg).  General - Well nourished, well developed, in no apparent distress.  Ophthalmologic - not able to see through.  Cardiovascular - Regular rate and rhythm with no murmur.  Mental Status -  Level of arousal and orientation to time, place, and person were intact. Language including expression, naming, repetition, comprehension was assessed and found intact.  Cranial Nerves II - XII - II - Visual field intact OU. III, IV, VI - Extraocular movements intact. V - Facial sensation intact bilaterally. VII -  Facial movement intact bilaterally. VIII - Hearing  & vestibular intact bilaterally. X - Palate elevates symmetrically. XI - Chin turning & shoulder shrug intact bilaterally. XII - Tongue protrusion intact.  Motor Strength - The patient's strength was normal in all extremities and pronator drift was absent.  Bulk was normal and fasciculations were absent.   Motor Tone - Muscle tone was assessed at the neck and appendages and was normal.  Reflexes - The patient's reflexes were 1+ in all extremities and he had no pathological reflexes.  Sensory - Light touch, temperature/pinprick were assessed and were normal.    Coordination - The patient had normal movements in the hands and feet with no ataxia or dysmetria.  Tremor was absent.  Gait and Station - slow cautious gait, but steady.   Data reviewed: I personally reviewed the images and agree with the radiology interpretations.  CT of the brain 03/20/2014 No acute intracranial abnormalities. Chronic atrophy and small vessel ischemic changes.  MRI of the brain 03/20/2014 1. Normal MRI appearance of the brain for age. 2. Minimal maxillary sinus disease. 3. Fluid in the mastoid air cells bilaterally. No obstructing nasopharyngeal lesion is present.  MRA of the brain 03/20/2014 Normal variant MRA circle of Willis without evidence for significant proximal stenosis, aneurysm, or branch vessel occlusion.  Carotid Doppler Bilateral: 1-39% ICA stenosis. Vertebral artery flow is antegrade.  2D Echocardiogram EF 55-60% with no source of embolus.  CXR 03/19/2014 No acute abnormalities.  EKG normal EKG, normal sinus rhythm  LDL was 65 and A1c 6.5  Assessment: As you may recall, he is a 73 y.o. Caucasian male with PMH of CAD status post CABG, hypertension, COPD, hyperlipidemia, OSA, prostate cancer was admitted on 03/19/14 due to acute onset vertigo, nausea and vomiting, feeling hot, sweating, ringing in the ears, not able to walk. MRI was negative for any is acute stroke. Stroke workup unrevealing. Due to  multiple stroke risk factors, his symptoms could be posterior circulation TIA versus DWI negative stroke. However, BPPV is not uncommon in elderly patient and sometimes can lasting for days. Vestibular neuritis is also in DDx. Continue stroke prevention and a stroke risk factor modification.  Plan:  - Continue aspirin and Plavix for stroke and cardiac prevention. - Continue Crestor for stroke prevention - check BP at home - Follow up with your primary care physician for stroke risk factor modification. Recommend maintain blood pressure goal <130/80, diabetes with hemoglobin A1c goal below 6.5% and lipids with LDL cholesterol goal below 70 mg/dL.  - RTC in 6 months   No orders of the defined types were placed in this encounter.    Patient Instructions  - continue ASA and plavix for stroke prevention - continue crestor for stroke prevention and cholesterol control - check BP at home - Follow up with your primary care physician for stroke risk factor modification. Recommend maintain blood pressure goal <130/80, diabetes with hemoglobin A1c goal below 6.5% and lipids with LDL cholesterol goal below 70 mg/dL.  - follow up in 6 months.    Rosalin Hawking, MD PhD [facility] [address] [place], [place] [address] [phone]

## 2014-08-07 DIAGNOSIS — I25810 Atherosclerosis of coronary artery bypass graft(s) without angina pectoris: Secondary | ICD-10-CM | POA: Diagnosis not present

## 2014-08-07 DIAGNOSIS — G459 Transient cerebral ischemic attack, unspecified: Secondary | ICD-10-CM | POA: Diagnosis not present

## 2014-08-07 DIAGNOSIS — Z8546 Personal history of malignant neoplasm of prostate: Secondary | ICD-10-CM | POA: Diagnosis not present

## 2014-08-07 DIAGNOSIS — I10 Essential (primary) hypertension: Secondary | ICD-10-CM | POA: Diagnosis not present

## 2014-08-07 DIAGNOSIS — E119 Type 2 diabetes mellitus without complications: Secondary | ICD-10-CM | POA: Diagnosis not present

## 2014-08-07 DIAGNOSIS — Z79899 Other long term (current) drug therapy: Secondary | ICD-10-CM | POA: Diagnosis not present

## 2014-08-07 DIAGNOSIS — E785 Hyperlipidemia, unspecified: Secondary | ICD-10-CM | POA: Diagnosis not present

## 2014-08-07 DIAGNOSIS — Z6830 Body mass index (BMI) 30.0-30.9, adult: Secondary | ICD-10-CM | POA: Diagnosis not present

## 2014-08-19 ENCOUNTER — Encounter: Payer: Self-pay | Admitting: Cardiovascular Disease

## 2014-08-19 ENCOUNTER — Ambulatory Visit (INDEPENDENT_AMBULATORY_CARE_PROVIDER_SITE_OTHER): Payer: Medicare Other | Admitting: Cardiovascular Disease

## 2014-08-19 VITALS — BP 140/80 | HR 76 | Ht 70.0 in | Wt 204.7 lb

## 2014-08-19 DIAGNOSIS — G459 Transient cerebral ischemic attack, unspecified: Secondary | ICD-10-CM

## 2014-08-19 DIAGNOSIS — I2510 Atherosclerotic heart disease of native coronary artery without angina pectoris: Secondary | ICD-10-CM | POA: Diagnosis not present

## 2014-08-19 DIAGNOSIS — E785 Hyperlipidemia, unspecified: Secondary | ICD-10-CM

## 2014-08-19 DIAGNOSIS — G4733 Obstructive sleep apnea (adult) (pediatric): Secondary | ICD-10-CM

## 2014-08-19 DIAGNOSIS — N529 Male erectile dysfunction, unspecified: Secondary | ICD-10-CM | POA: Diagnosis not present

## 2014-08-19 DIAGNOSIS — I25810 Atherosclerosis of coronary artery bypass graft(s) without angina pectoris: Secondary | ICD-10-CM | POA: Diagnosis not present

## 2014-08-19 DIAGNOSIS — I2583 Coronary atherosclerosis due to lipid rich plaque: Secondary | ICD-10-CM

## 2014-08-19 MED ORDER — NIACIN ER 500 MG PO CPCR: 500.0000 mg | ORAL_CAPSULE | Freq: Every day | ORAL | Status: DC

## 2014-08-19 MED ORDER — OMEGA-3-ACID ETHYL ESTERS 1 G PO CAPS: 1.0000 g | ORAL_CAPSULE | Freq: Every day | ORAL | Status: DC

## 2014-08-19 MED ORDER — NIACIN ER (ANTIHYPERLIPIDEMIC) 500 MG PO TBCR: 500.0000 mg | EXTENDED_RELEASE_TABLET | Freq: Every day | ORAL | Status: DC

## 2014-08-19 MED ORDER — EZETIMIBE 10 MG PO TABS: 10.0000 mg | ORAL_TABLET | Freq: Every morning | ORAL | Status: DC

## 2014-08-19 MED ORDER — ROSUVASTATIN CALCIUM 20 MG PO TABS: 20.0000 mg | ORAL_TABLET | Freq: Every day | ORAL | Status: DC

## 2014-08-19 NOTE — Progress Notes (Signed)
Patient ID: Daniel Singleton, male   DOB: 1941/04/02, 73 y.o.   MRN: [id number]        HPI:  Daniel Singleton is a 73 year old gentleman who is followed Dr. [name] for primary medical care.  He presents for 4 month followup cardiology evaluation.  Daniel Singleton has a history of hypertension, hyperlipidemia, COPD, prior asbestos exposure, diabetes mellitus , and  prostate cancer. In January 2006, he underwent CABG surgery x4  in [facility] by Dr. Zorita Pang. I do not have the specifics of his bypass grafts. He states that last year, he did undergo a stress test in [place] after he had experienced some recurrent chest pain episodes and a cardiac catheterization which showed mild blockages.  He also has a history of obstructive sleep apnea on CPAP therapy with 3 L of oxygen.  Daniel Singleton does admit to some shortness of breath.  He is unaware of tachycardia palpitations. He does have hyperlipidemia. Apparently he had been on isosorbide mononitrate therapy but stopped taking this because of libido concerns and potential need for medications for erectile function.  An echo Doppler study on 08/12/201 showed an ejection fraction of 55-60%. Mild tricuspid regurgitation. He did have grade 1 diastolic dysfunction. When I saw him, I recommended that we perform advanced lipid testing with NMR lipoprotein of since he has had low HDL levels and her recent cholesterol was 185 triglycerides 153 HDL 31 and LDL 123. At that time, I also started him on Ranexa 500 mg twice a day since he had discontinued his isosorbide. I added Zetia 10 mg to his atorvastatin and recommended that he undergo a followup with NMR lipoprotein assessment.  His NMR study was markedly abnormal. Specifically, his total cholesterol was 187 triglycerides 348 HDL 32 LDL cholesterol 85. However, he had markedly increased goal LDL particles at 1739 status LDL particle number was markedly increased at 2463. The patient tells me he did  develop some myalgias on Lipitor and therefore for approximately the last 3-4 weeks completely discontinued a torus that in therapy.  He was  hospitalized on December 26 through 08/21/2013 abdominal discomfort due to partial small bowel obstruction. He had experienced nausea and vomiting with diarrhea for 4 days prior to his admission and was found to have a partial mid-to high grade small bowel obstruction with a transition point in the right lower quadrant. He was cleared for surgery and if surgery was to be done he was to stop his Plavix and continue aspirin. Fortunately, his bowel obstruction resolved with medical management and surgical intervention was not necessary.  I'd seen him in January 2015 at which time he was off Plavix therapy and was not having any anginal symptoms on his current medical regimen.  He was hospitalized on 03/19/2014 through 03/21/2014 with ataxia.  He had complaints of vertigo with nausea, vomiting, double vision, numbness, and weakness.  A CT of the brain did not show acute intracranial abnormalities, but did show chronic atrophy and small vessel ischemic changes.  A 2-D echo Doppler study showed an EF of 45-50% without cardiac source of emboli.  Carotid studies revealed mild bilateral plaque with narrowing of 1-39% range.  Antegrade flow is vertebral artery.  Doppler of the brachial artery demonstrate triphasic waveforms.  He was felt possibly to have a posterior circulation TIA.  His aspirin 81 mg was increased to 325 mg.  and I last saw him in August 2015 due to his recent TIA.  I recommended reinitiation  of Plavix 75 mg daily and reduced his aspirin dose back to 81 mg.  Since his last evaluation.  He feels that he has been stable from a neurologic 60 and point.  He has not had any recurrent neurologic symptoms.  He denies chest pain.  He doesn't fatigue and shortness of breath with activity.  He tells me he recently saw a urologist and was given a new medication for  improvement in erectile function.  He cannot recall the name, but it is not Viagra, Levitra, or cialis.   Past Medical History  Diagnosis Date  . Hypertension   . Emphysema   . Diabetes   . Hypercholesteremia   . PONV (postoperative nausea and vomiting)   . Angina at rest   . Coronary artery disease     recently started seeing Dr. Claiborne Billings   . Arthritis   . GERD (gastroesophageal reflux disease)   . Elevated PSA   . Sleep apnea     c pap with oxygen at 2.5 L  . Prostate cancer 05/23/13    gleason 3+4=7, volume 31 cc  . Asthma   . Depression   . Anxiety   . Hx of radiation therapy 09/20/13- 11/14/13    prostate 7800 cGy 40 sessions, seminal vesicles 5600 cGy in 40 sessions    Past Surgical History  Procedure Laterality Date  . Heart bypass  2006    Quad  . Degloving rt leg injury  2000    had skin grafts  . Hernia repair  2008    x 3 w/mesh, bilat inguinla, umbilical  . Back surgery  2006    discectomy lumbar  . Tonsillectomy      w/adenoidectomy  . Prostate biopsy N/A 05/23/2013    Procedure: BIOPSY TRANSRECTAL ULTRASONIC PROSTATE (TUBP);  Surgeon: Daniel Hazard, MD;  Location: [facility];  Service: Urology;  Laterality: N/A;  prostate nerve block  . Back surgery    . Foot surgery    . Back surgery      Allergies  Allergen Reactions  . Niacin And Related Itching    Current Outpatient Prescriptions  Medication Sig Dispense Refill  . albuterol (PROVENTIL HFA;VENTOLIN HFA) 108 (90 BASE) MCG/ACT inhaler Inhale 2 puffs into the lungs every 6 (six) hours as needed for wheezing. 1 Inhaler 3  . ALPRAZolam (XANAX) 0.25 MG tablet Take 0.25 mg by mouth daily.    Marland Kitchen aspirin 81 MG tablet Take 81 mg by mouth daily.    Marland Kitchen BIOTIN 5000 PO Take 2 tablets by mouth daily.    . clopidogrel (PLAVIX) 75 MG tablet Take 1 tablet (75 mg total) by mouth daily. 90 tablet 3  . DULoxetine (CYMBALTA) 60 MG capsule Take 60 mg by mouth daily.    Marland Kitchen ezetimibe (ZETIA) 10 MG tablet Take 1 tablet (10  mg total) by mouth every morning. 90 tablet 2  . LEVITRA 20 MG tablet Take 20 mg by mouth daily.  10  . Melatonin 10 MG CAPS Take 10 mg by mouth daily.    . metoprolol succinate (TOPROL-XL) 25 MG 24 hr tablet Take 1 tablet by mouth  daily 90 tablet 1  . niacin (NIASPAN) 1000 MG CR tablet Take 1,000 mg by mouth at bedtime.    . nitroGLYCERIN (NITROSTAT) 0.3 MG SL tablet Place 0.3 mg under the tongue every 5 (five) minutes as needed for chest pain.    Marland Kitchen omega-3 acid ethyl esters (LOVAZA) 1 G capsule Take 1 capsule (1 g  total) by mouth daily. 90 capsule 2  . ranolazine (RANEXA) 500 MG 12 hr tablet Take 1 tablet (500 mg total) by mouth 2 (two) times daily. 180 tablet 2  . rosuvastatin (CRESTOR) 20 MG tablet Take 1 tablet (20 mg total) by mouth daily. 90 tablet 1   No current facility-administered medications for this visit.    Social history is notable in that he is married to his 2 children ages 30 and 65. He smoked one pack of cigarettes per day until 2000. He does drink occasional alcohol. He does walk  Family History  Problem Relation Age of Onset  . Heart disease Mother   . Heart attack Mother   . Cancer Sister   . Depression Sister    ROS General: Negative; No fevers, chills, or night sweats;  HEENT: Negative; No changes in vision or hearing, sinus congestion, difficulty swallowing Pulmonary: Positive for asbestosis exposure No cough, wheezing, shortness of breath, hemoptysis Cardiovascular: Negative; No chest pain, presyncope, syncope, palpitations GI: Negative; No nausea, vomiting, diarrhea, or abdominal pain GU: Positive for prostate CA No dysuria, hematuria, or difficulty voiding Musculoskeletal: Remote degloving injury; no myalgias, joint pain, or weakness Hematologic/Oncology: Negative; no easy bruising, bleeding Endocrine: Negative; no heat/cold intolerance; no diabetes Neuro: See history of present illness, recent ataxia, probable posterior circulation TIA Skin: Negative;  No rashes or skin lesions Psychiatric: Negative; No behavioral problems, depression Sleep: Positive for obstructive sleep apnea with supplemental 3 L oxygen; No residual snoring, daytime sleepiness, hypersomnolence, bruxism, restless legs, hypnogognic hallucinations, no cataplexy Other comprehensive 14 point system review is negative.   PE Ht 5\' 10"  (1.778 m)  Wt 204 lb 11.2 oz (92.851 kg)  BMI 29.37 kg/m2 General: Alert, oriented, no distress.  Skin: normal turgor, no rashes HEENT: Normocephalic, atraumatic. Pupils round and reactive; sclera anicteric; Fundi no hemorrhages or exudates Nose without nasal septal hypertrophy Mouth/Parynx benign; Mallinpatti scale 3 Neck: No JVD, no carotid bruits Lungs: clear to ausculatation and percussion; no wheezing or rales Chest wall: No tenderness to palpation Heart: RRR, s1 s2 normal 1/6 systolic murmur; no diastolic murmur.  No rubs, thrills or heaves Abdomen: Mild diastases recti; soft, nontender; no hepatosplenomehaly, BS+; abdominal aorta nontender and not dilated by palpation. Back: No CVA tenderness. Pulses 2+ Extremities: no clubbinbg cyanosis or edema, Homan's sign negative  Neurologic: grossly nonfocal Psychological: Normal affect and mood; normal cognition  ECG (independently read by me): Normal sinus rhythm with mild sinus arrhythmia and PACs with ventricular rate at 76 bpm.  QTc interval 427 ms.  No significant ST segment changes.  August 2015 ECG (independently read by me): Sinus rhythm with sinus arrhythmia.  Nonspecific T changes.  QTc interval 421 ms.  08/30/2013 ECG independently read by me today shows normal sinus rhythm with sinus arrhythmia with a ventricular rate in the 80s. They're nonspecific ST-T changes.  Prior ECG in October 2014: Sinus rhythm with mild sinus arrhythmia at 60 beats per minute. PR interval 172 ms. QTC interval 384 ms.  LABS:  BMET    Component Value Date/Time   NA 139 03/20/2014 0438      Hepatic Function Panel     Component Value Date/Time   PROT 6.2 03/20/2014 0438     CBC    Component Value Date/Time   WBC 8.0 03/20/2014 0438     BNP No results found for: PROBNP    Lipid Panel     Component Value Date/Time   CHOL 123 03/20/2014 0435   TRIG  78 03/20/2014 0435   TRIG 96 08/01/2013 0952   HDL 42 03/20/2014 0435   CHOLHDL 2.9 03/20/2014 0435   VLDL 16 03/20/2014 0435   LDLCALC 65 03/20/2014 0435   LDLCALC 53 08/01/2013 0952     RADIOLOGY: No results found.   ASSESSMENT AND PLAN: Mr. Brenin Heidelberger is a 73 year old gentleman with a history of diabetes mellitus, hyperlipidemia, obstructive sleep apnea on CPAP therapy with supplemental oxygen, as well as COPD. He has a prior tobacco history but he quit on 01/17/1999. He is status post CABG surgery x4 in January 2006 and last year catheterization demonstrated some mild blockages.  With reference to his CAD, he denies recent anginal symptomatology and is tolerating Ranexa 500 mg twice a day, Toprol-XL 25 mg daily.  He is now back on dual antiplatelet therapy.  He denies any recurrent neurologic sequelae following his event which had occurred when he was off Plavix.  I reviewed his most recent lipid panel.  I have suggested he reduce his over-the-counter niacin from 1000 mg to 500 mg.  He is also taking lovaza1 g daily in addition to Crestor 20 mg and Zetia 10 mg.  His LDL is at target.  His HDL is now in the normal range.  He denies myalgias.  He will notify me with reference to the new drug that he was recently prescribed by his urologist.  He is not on nitrate therapy, and most likely will be able to tolerate this.  For his erectile dysfunction.  His long as he remains stable, cardiovascular, I will see him in 6 months for reevaluation or sooner if problems arise.  Time spent: 25 minutes  Troy Sine, MD, [facility] 08/19/2014 10:59 AM

## 2014-08-19 NOTE — Patient Instructions (Signed)
DECREASE NIACIN CR TO 500 MG AT BEDTIME , DO NOT HALF  1000 MG  CALL BACK TO LET us KNOW WHAT ERECTILE DYSFUNCTION  MEDICATION YOU HAVE A QUESTION ABOUT.  Your physician wants you to follow-up in [place]. You will receive a reminder letter in the mail two months in advance. If you don't receive a letter, please call our office to schedule the follow-up appointment.

## 2014-08-21 ENCOUNTER — Encounter: Payer: Self-pay | Admitting: Cardiovascular Disease

## 2014-08-21 DIAGNOSIS — N529 Male erectile dysfunction, unspecified: Secondary | ICD-10-CM | POA: Insufficient documentation

## 2014-09-03 DIAGNOSIS — R0609 Other forms of dyspnea: Secondary | ICD-10-CM | POA: Diagnosis not present

## 2014-09-03 DIAGNOSIS — F418 Other specified anxiety disorders: Secondary | ICD-10-CM | POA: Diagnosis not present

## 2014-09-03 DIAGNOSIS — E119 Type 2 diabetes mellitus without complications: Secondary | ICD-10-CM | POA: Diagnosis not present

## 2014-09-23 ENCOUNTER — Telehealth: Payer: Self-pay | Admitting: Internal Medicine

## 2014-09-23 MED ORDER — AZITHROMYCIN 250 MG PO TABS: ORAL_TABLET | ORAL | Status: DC

## 2014-09-23 NOTE — Telephone Encounter (Signed)
Offer Zpak 

## 2014-09-23 NOTE — Telephone Encounter (Signed)
Spoke with pt's wife. States that pt has had a cough since yesterday. Cough is not productive. Denies SOB or chest tightness. Would like something called in for him.  Allergies  Allergen Reactions  . Niacin And Related Itching    CY - please advise. Thanks.

## 2014-09-23 NOTE — Telephone Encounter (Signed)
Called and spoke to pt's wife. Informed pt's wife of the recs per CY. Rx sent to preferred pharmacy. Pt's wife verbalized understanding and denied any further questions or concerns at this time.

## 2014-10-02 ENCOUNTER — Other Ambulatory Visit: Payer: Self-pay | Admitting: *Deleted

## 2014-10-02 MED ORDER — FLUTICASONE FUROATE-VILANTEROL 100-25 MCG/INH IN AEPB: 100.0000 ug | INHALATION_SPRAY | Freq: Every day | RESPIRATORY_TRACT | Status: DC

## 2014-11-11 ENCOUNTER — Ambulatory Visit: Payer: Medicare Other | Admitting: Internal Medicine

## 2014-11-11 ENCOUNTER — Encounter: Payer: Self-pay | Admitting: Internal Medicine

## 2014-11-11 DIAGNOSIS — Z23 Encounter for immunization: Secondary | ICD-10-CM

## 2014-11-11 MED ORDER — ALBUTEROL SULFATE HFA 108 (90 BASE) MCG/ACT IN AERS: 2.0000 | INHALATION_SPRAY | Freq: Four times a day (QID) | RESPIRATORY_TRACT | Status: DC | PRN

## 2014-11-11 NOTE — Patient Instructions (Signed)
Please watch for snoring and stopping breathing in sleep. We need to decide what to do about the sleep apnea issue.  Script sent refilling the Ventolin rescue inhaler  Prevnar 13 pneumococcal vaccine

## 2014-11-11 NOTE — Progress Notes (Signed)
3 yoM self referred for  COPD with emphysema diagnosed 9798, complicated by CAD/CABG 4V, DM.       Here with wife Smoked one pack per day until quitting in 2000. Retired from [employer] with associated dust. 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 He says this was associated with asbestos exposure from brake dust. Issues are in legal consideration and he notes that no diagnosis has been made. Has had pneumonia twice, and pneumonia vaccine. History of sleep apnea diagnosis but quit CPAP. Wife confirms he snores wearing a chin strap and sleeping in a recliner. Significant injury at work 01/17/1999, broken tibia and "degloving" of R  lower leg.  01/10/13- self referred for  COPD with emphysema diagnosed 9211, OSA, complicated by CAD/CABG 4V, DM, hx major trauma leg.       Here with wife Mowing the yard with mask in dusty but will still cough. Wife says if he lies down a recliner he holds his breath saturation will drop to 86%.he says he has been wearing his CPAP again/Advanced, but she describes apneas when he is sleeping without it.  PFT 01/01/2013: Minimal obstructive airways disease, air trapping with increased residual volume, mild response to bronchodilator, diffusion slightly reduced. FVC 4.11/96%, FEV1 2.59/82%, FEV1/FVC 0.63/85%, FEF 25-75% 1.63/68%. TLC 100%, DLCO 76%. CXR 11/23/12 IMPRESSION:  Underlying emphysema. No edema or consolidation.  Original Report Authenticated By: Lowella Grip, M.D.  03/05/13- self referred for  COPD with emphysema diagnosed 9417, OSA, complicated by CAD/CABG 4V, DM, hx major trauma leg.       Here with wife FOLLOWS FOR: pt reports breathing is doing well-- states concentrator is making a "racket and is on its last leg"-- denies any other concerns at this time. CPAP Autopap/ Advanced Mask hurts bridge of nose if he stretches it on too tightly. Oxygen concentrator is getting loud. Compliance okay but inadequate control  based on download. Needs broader pressure range for autotitration.  09/04/13- 63 yoM former smoker self referred for  COPD with emphysema, hx asbestos exposure, OSA, complicated by CAD/CABG 4V, DM, hx major trauma leg.       Here with wife CPAP AutoPAP Advanced- not using-blows too hard. Does wearing oxygen 3 L/Advanced for sleep. Wife says she rarely notices apneas now when he is not wearing CPAP. FOLLOWS FOR: Pt states breathing is doing well since last visit; recent hospital for intestine blockage. Pending XRT for prostate cancer  11/20/13- 72 yoM former smoker self referred for  COPD with emphysema, hx asbestos exposure, OSA, complicated by CAD/CABG 4V, DM, hx major trauma leg, prostate Ca.       Here with wife FOLLOWS FOR: wears CPAPAuto or 10?/ Advanced every night for about 6 hours; Pt states [facility] did ONO since visit off CPAP- seeking report Pressure working well for patient; DME is [facility], Still using oxygen at 3 L. No recent nitroglycerin. Not using inhalers. Recent hospital for small bowel obstruction.   05/13/14- 86 yoM former smoker self referred for  COPD with emphysema, hx asbestos exposure, OSA, complicated by CAD/CABG 4V, DM, hx major trauma leg, prostate Ca.       Here with wife follows for: Pt states he was off CPAP auto/+ O2 3L Advanced for several months while getting XRT for prostate cancer, but started back x 2 weeks ago. He is wearing it every night for about 8 hours a night.  Sleeps in a recliner. COPD: Pt denies any increase in SOB,  cough at this time.  CXR 03/19/14 IMPRESSION:  No acute abnormalities.  Electronically Signed  By: Rozetta Nunnery M.D.  On: 03/19/2014 20:24  11/11/14-73 yoM former smoker self referred for  COPD with emphysema, hx asbestos exposure, OSA, complicated by CAD/CABG 4V, DM, hx major trauma leg, prostate Ca.       Here with wife FOLLOWS FOR: Has not worn CPAP in a while; pt states Breo is helping with breathing,   ROS-see HPI Constitutional:   No-    weight loss, night sweats, fevers, chills, +fatigue, lassitude. HEENT:   No-  headaches, difficulty swallowing, tooth/dental problems, sore throat,       No-  sneezing, itching, ear ache, nasal congestion, post nasal drip,  CV:  No-   chest pain, orthopnea, PND, swelling in lower extremities, anasarca, dizziness, palpitations Resp: + shortness of breath with exertion or at rest.              No-   productive cough,  No non-productive cough,  No- coughing up of blood.              No-   change in color of mucus.  No- wheezing.   Skin: No-   rash or lesions. GI:  No-   heartburn, indigestion, abdominal pain, nausea, vomiting, GU:  MS:  No-   joint pain or swelling.  . Neuro-     nothing unusual Psych:  No- change in mood or affect. + depression or anxiety.  No memory loss.  OBJ- Physical Exam General- Alert, Oriented, Affect-appropriate, Distress- none acute. overweight Skin- rash-none, lesions- none, excoriation- none Lymphadenopathy- none Head- atraumatic            Eyes- Gross vision intact, PERRLA, conjunctivae and secretions clear            Ears- Hearing, canals-normal            Nose-  Clear, no-Septal dev, mucus, polyps, erosion, perforation             Throat- Mallampati II , mucosa clear , drainage- none, tonsils- atrophic Neck- flexible , trachea midline, no stridor , thyroid nl, carotid no bruit Chest - symmetrical excursion , unlabored           Heart/CV- RRR , no murmur , no gallop  , no rub, nl s1 s2                           - JVD- none , edema- none, stasis changes- none, varices- none           Lung- +diminished but clear to P&A, wheeze- none, cough- none , dullness-none, rub- none           Chest wall-  Abd-  Br/ Gen/ Rectal- Not done, not indicated Extrem- cyanosis- none, clubbing, none, atrophy- none, strength- nl. -+cane. Support stockings Neuro- grossly intact to observation

## 2014-11-15 DIAGNOSIS — E119 Type 2 diabetes mellitus without complications: Secondary | ICD-10-CM | POA: Diagnosis not present

## 2014-11-15 DIAGNOSIS — H25813 Combined forms of age-related cataract, bilateral: Secondary | ICD-10-CM | POA: Diagnosis not present

## 2014-12-06 DIAGNOSIS — G4733 Obstructive sleep apnea (adult) (pediatric): Secondary | ICD-10-CM | POA: Diagnosis not present

## 2014-12-06 DIAGNOSIS — Z6830 Body mass index (BMI) 30.0-30.9, adult: Secondary | ICD-10-CM | POA: Diagnosis not present

## 2014-12-06 DIAGNOSIS — I10 Essential (primary) hypertension: Secondary | ICD-10-CM | POA: Diagnosis not present

## 2014-12-06 DIAGNOSIS — E119 Type 2 diabetes mellitus without complications: Secondary | ICD-10-CM | POA: Diagnosis not present

## 2014-12-06 DIAGNOSIS — I25729 Atherosclerosis of autologous artery coronary artery bypass graft(s) with unspecified angina pectoris: Secondary | ICD-10-CM | POA: Diagnosis not present

## 2014-12-06 DIAGNOSIS — E785 Hyperlipidemia, unspecified: Secondary | ICD-10-CM | POA: Diagnosis not present

## 2014-12-06 DIAGNOSIS — Z8546 Personal history of malignant neoplasm of prostate: Secondary | ICD-10-CM | POA: Diagnosis not present

## 2014-12-06 DIAGNOSIS — J449 Chronic obstructive pulmonary disease, unspecified: Secondary | ICD-10-CM | POA: Diagnosis not present

## 2014-12-09 ENCOUNTER — Telehealth: Payer: Self-pay | Admitting: *Deleted

## 2014-12-09 NOTE — Telephone Encounter (Signed)
Spoke with the pts wife and was able to get the pts appt rescheduled. She thanked me

## 2014-12-30 ENCOUNTER — Telehealth: Payer: Self-pay | Admitting: Cardiovascular Disease

## 2014-12-30 MED ORDER — RANOLAZINE ER 500 MG PO TB12
500.0000 mg | ORAL_TABLET | Freq: Two times a day (BID) | ORAL | Status: DC
Start: 2014-12-30 — End: 2015-03-26

## 2014-12-30 NOTE — Telephone Encounter (Signed)
°  1. Which medications need to be refilled? Ranexa  2. Which pharmacy is medication to be sent to?[facility]  3. Do they need a 30 day or 90 day supply? 90  4. Would they like a call back once the medication has been sent to the pharmacy? Yes, he has a weeks worth left

## 2014-12-30 NOTE — Telephone Encounter (Signed)
Rx refill sent to patient pharmacy. Patient notified  

## 2015-01-22 ENCOUNTER — Ambulatory Visit (INDEPENDENT_AMBULATORY_CARE_PROVIDER_SITE_OTHER): Payer: Medicare Other | Admitting: Neurology

## 2015-01-22 ENCOUNTER — Encounter: Payer: Self-pay | Admitting: Neurology

## 2015-01-22 ENCOUNTER — Other Ambulatory Visit: Payer: Self-pay | Admitting: Cardiovascular Disease

## 2015-01-22 VITALS — BP 136/67 | HR 79 | Wt 204.0 lb

## 2015-01-22 DIAGNOSIS — E785 Hyperlipidemia, unspecified: Secondary | ICD-10-CM | POA: Diagnosis not present

## 2015-01-22 DIAGNOSIS — I25810 Atherosclerosis of coronary artery bypass graft(s) without angina pectoris: Secondary | ICD-10-CM

## 2015-01-22 DIAGNOSIS — G450 Vertebro-basilar artery syndrome: Secondary | ICD-10-CM

## 2015-01-22 NOTE — Patient Instructions (Addendum)
-   continue ASA and plavix for stroke prevention - continue crestor for stroke prevention and cholesterol control - check BP at home - Follow up with your primary care physician for stroke risk factor modification. Recommend maintain blood pressure goal <130/80, diabetes with hemoglobin A1c goal below 6.5% and lipids with LDL cholesterol goal below 70 mg/dL.  - follow up as needed.

## 2015-01-22 NOTE — Progress Notes (Signed)
STROKE NEUROLOGY FOLLOW UP NOTE  NAME: Daniel Singleton DOB: May 03, 1941  REASON FOR VISIT: stroke follow up HISTORY FROM: pt and wife and chart  Today we had the pleasure of seeing Daniel Singleton. Pt was accompanied by wife.   History Summary Daniel Singleton is a 74 y.o. male with history of CAD status post CABG, hypertension, COPD, hyperlipidemia, OSA, prostate cancer was admitted on 03/19/14 due to acute onset vertigo, nausea and vomiting, feeling hot, sweating, ringing in the ears, not able to walk. Glucose was 137. In the ER, patient was found to have nystagmus and difficulty ambulating. CT of the head was done which did not show anything acute. Denies any difficulty speaking swallowing. Denies any headache visual symptoms or any difficulty hearing or tinnitus. Patient is able to move all extremities 5 x 5. MRI did not show acute stroke. In hospital, he still has dizziness but not vertigo, unsteady gait, taking meclizine. Symptoms gradually better and he was discharged on 3rd day. He was on ASA and plavix in the past, but in 07/2013, he was off plavix for abdominal procedure but did not put back on. He was on ASA only for the recent admission. After admission, he was discharged on aspirin full dose.  Follow up 05/02/14 - the patient has been doing well.  His dizziness resolved. He followup with his cardiologist, and he was put back on dural antiplatelet with aspirin 81 and Plavix 75. He is currently on 4 medication for his hyperlipidemia, including lovaza, Crestor, zetia and niacin.   Follow up 08/06/14 - he has been doing well. No recurrent symptoms. Continued on ASA and plavix as well as crestor. No specific complaint. BP at home 110-120/60-70. Today in [place] 106/67.  Interval History During the interval time,  He has been doing well, without recurrent strokelike symptoms. He stated that in December he had one episode of diaphoresis and hot feeling of both  ears when he was drinking very cold drinks. No weakness, dizziness, numbness, nausea vomiting at that time. Patient does not think that episode resembles any of the stroke symptoms before. He stated that he has some muscle pain and joint pain well taking Zetia and Crestor, however he was able to tolerate and does not want to change the medication dose. Blood pressure 136/67 today.  REVIEW OF SYSTEMS: Full 14 system review of systems performed and notable only for those listed below and in HPI above, all others are negative:  Fatigue, SOB, restless leg, snoring, apnea, daytime sleepiness, joint pain, weakness, depression, urinary urgency.  The following represents the patient's updated allergies and side effects list: Allergies  Allergen Reactions  . Niacin And Related Itching    Labs since last visit of relevance include the following: Results for orders placed or performed during the hospital encounter of 03/19/14  CBC  Result Value Ref Range   WBC 8.4 4.0 - 10.5 K/uL   RBC 4.21 (L) 4.22 - 5.81 MIL/uL   Hemoglobin 13.1 13.0 - 17.0 g/dL   HCT 37.5 (L) 39.0 - 52.0 %   MCV 89.1 78.0 - 100.0 fL   MCH 31.1 26.0 - 34.0 pg   MCHC 34.9 30.0 - 36.0 g/dL   RDW 13.8 11.5 - 15.5 %   Platelets  150 - 400 K/uL    PLATELET CLUMPS NOTED ON SMEAR, COUNT APPEARS DECREASED  Basic metabolic panel  Result Value Ref Range   Sodium 138 137 - 147 mEq/L  Potassium 4.2 3.7 - 5.3 mEq/L   Chloride 98 96 - 112 mEq/L   CO2 28 19 - 32 mEq/L   Glucose, Bld 173 (H) 70 - 99 mg/dL   BUN 12 6 - 23 mg/dL   Creatinine, Ser 1.01 0.50 - 1.35 mg/dL   Calcium 8.9 8.4 - 10.5 mg/dL   GFR calc non Af Amer 73 (L) >90 mL/min   GFR calc Af Amer 84 (L) >90 mL/min   Anion gap 12 5 - 15  Comprehensive metabolic panel  Result Value Ref Range   Sodium 139 137 - 147 mEq/L   Potassium 4.1 3.7 - 5.3 mEq/L   Chloride 99 96 - 112 mEq/L   CO2 24 19 - 32 mEq/L   Glucose, Bld 152 (H) 70 - 99 mg/dL   BUN 11 6 - 23 mg/dL    Creatinine, Ser 0.90 0.50 - 1.35 mg/dL   Calcium 8.9 8.4 - 10.5 mg/dL   Total Protein 6.2 6.0 - 8.3 g/dL   Albumin 3.5 3.5 - 5.2 g/dL   AST 19 0 - 37 U/L   ALT 18 0 - 53 U/L   Alkaline Phosphatase 46 39 - 117 U/L   Total Bilirubin 0.3 0.3 - 1.2 mg/dL   GFR calc non Af Amer 84 (L) >90 mL/min   GFR calc Af Amer >90 >90 mL/min   Anion gap 16 (H) 5 - 15  CBC with Differential  Result Value Ref Range   WBC 8.0 4.0 - 10.5 K/uL   RBC 4.40 4.22 - 5.81 MIL/uL   Hemoglobin 13.2 13.0 - 17.0 g/dL   HCT 38.8 (L) 39.0 - 52.0 %   MCV 88.2 78.0 - 100.0 fL   MCH 30.0 26.0 - 34.0 pg   MCHC 34.0 30.0 - 36.0 g/dL   RDW 13.9 11.5 - 15.5 %   Platelets 130 (L) 150 - 400 K/uL   Neutrophils Relative % 81 (H) 43 - 77 %   Neutro Abs 6.5 1.7 - 7.7 K/uL   Lymphocytes Relative 10 (L) 12 - 46 %   Lymphs Abs 0.8 0.7 - 4.0 K/uL   Monocytes Relative 8 3 - 12 %   Monocytes Absolute 0.6 0.1 - 1.0 K/uL   Eosinophils Relative 1 0 - 5 %   Eosinophils Absolute 0.1 0.0 - 0.7 K/uL   Basophils Relative 0 0 - 1 %   Basophils Absolute 0.0 0.0 - 0.1 K/uL  TSH  Result Value Ref Range   TSH 1.380 0.350 - 4.500 uIU/mL  Hemoglobin A1c  Result Value Ref Range   Hgb A1c MFr Bld 6.5 (H) <5.7 %   Mean Plasma Glucose 140 (H) <117 mg/dL  Lipid panel  Result Value Ref Range   Cholesterol 123 0 - 200 mg/dL   Triglycerides 78 <150 mg/dL   HDL 42 >39 mg/dL   Total CHOL/HDL Ratio 2.9 RATIO   VLDL 16 0 - 40 mg/dL   LDL Cholesterol 65 0 - 99 mg/dL  I-Stat Troponin, ED  Result Value Ref Range   Troponin i, poc 0.00 0.00 - 0.08 ng/mL   Comment 3            The neurologically relevant items on the patient's problem list were reviewed on today's visit.  Neurologic Examination  A problem focused neurological exam (12 or more points of the single system neurologic examination, vital signs counts as 1 point, cranial nerves count for 8 points) was performed.  Blood pressure 136/67, pulse 79,  weight 204 lb (92.534  kg).  General - Well nourished, well developed, in no apparent distress.  Ophthalmologic - not able to see through.  Cardiovascular - Regular rate and rhythm with no murmur.  Mental Status -  Level of arousal and orientation to time, place, and person were intact. Language including expression, naming, repetition, comprehension was assessed and found intact.  Cranial Nerves II - XII - II - Visual field intact OU. III, IV, VI - Extraocular movements intact. V - Facial sensation intact bilaterally. VII - Facial movement intact bilaterally. VIII - Hearing & vestibular intact bilaterally. X - Palate elevates symmetrically. XI - Chin turning & shoulder shrug intact bilaterally. XII - Tongue protrusion intact.  Motor Strength - The patient's strength was normal in all extremities and pronator drift was absent.  Bulk was normal and fasciculations were absent.   Motor Tone - Muscle tone was assessed at the neck and appendages and was normal.  Reflexes - The patient's reflexes were 1+ in all extremities and he had no pathological reflexes.  Sensory - Light touch, temperature/pinprick were assessed and were normal.    Coordination - The patient had normal movements in the hands and feet with no ataxia or dysmetria.  Tremor was absent.  Gait and Station - slow cautious gait, but steady.   Data reviewed: I personally reviewed the images and agree with the radiology interpretations.  CT of the brain 03/20/2014 No acute intracranial abnormalities. Chronic atrophy and small vessel ischemic changes.  MRI of the brain 03/20/2014 1. Normal MRI appearance of the brain for age. 2. Minimal maxillary sinus disease. 3. Fluid in the mastoid air cells bilaterally. No obstructing nasopharyngeal lesion is present.  MRA of the brain 03/20/2014 Normal variant MRA circle of Willis without evidence for significant proximal stenosis, aneurysm, or branch vessel occlusion.  Carotid Doppler Bilateral: 1-39% ICA  stenosis. Vertebral artery flow is antegrade.  2D Echocardiogram EF 55-60% with no source of embolus.  CXR 03/19/2014 No acute abnormalities.  EKG normal EKG, normal sinus rhythm  Component     Latest Ref Rng 03/20/2014  Cholesterol     0 - 200 mg/dL 123  Triglycerides     <150 mg/dL 78  HDL Cholesterol     >39 mg/dL 42  Total CHOL/HDL Ratio      2.9  VLDL     0 - 40 mg/dL 16  LDL (calc)     0 - 99 mg/dL 65  Hemoglobin A1C     <5.7 % 6.5 (H)  Mean Plasma Glucose     <117 mg/dL 140 (H)  TSH     0.350 - 4.500 uIU/mL 1.380    Assessment: As you may recall, he is a 74 y.o. Caucasian male with PMH of CAD status post CABG, hypertension, COPD, hyperlipidemia, OSA, prostate cancer was admitted on 03/19/14 due to acute onset vertigo, nausea and vomiting, feeling hot, sweating, ringing in the ears, not able to walk. MRI was negative for any is acute stroke. Stroke workup unrevealing. Due to multiple stroke risk factors, his symptoms could be posterior circulation TIA versus DWI negative stroke. However, BPPV is not uncommon in elderly patient and sometimes can lasting for days. Vestibular neuritis is also in DDx. Continue stroke prevention and a stroke risk factor modification.  Plan:  - Continue aspirin and Plavix for stroke and cardiac prevention. - Continue Crestor for stroke prevention - check BP at home - Follow up with your primary care physician for stroke risk  factor modification. Recommend maintain blood pressure goal <130/80, diabetes with hemoglobin A1c goal below 6.5% and lipids with LDL cholesterol goal below 70 mg/dL.  - RTC PRN  Meds ordered this encounter  Medications  . DISCONTD: NITROSTAT 0.4 MG SL tablet    Sig:     Refill:  1    No orders of the defined types were placed in this encounter.    Patient Instructions  - continue ASA and plavix for stroke prevention - continue crestor for stroke prevention and cholesterol control - check BP at home - Follow up with  your primary care physician for stroke risk factor modification. Recommend maintain blood pressure goal <130/80, diabetes with hemoglobin A1c goal below 6.5% and lipids with LDL cholesterol goal below 70 mg/dL.  - follow up as needed.    Rosalin Hawking, MD PhD [facility] [address] [place], [place] [address] [phone]

## 2015-01-22 NOTE — Telephone Encounter (Signed)
Rx(s) sent to pharmacy electronically.  

## 2015-02-05 ENCOUNTER — Ambulatory Visit: Payer: Medicare Other | Admitting: Neurology

## 2015-02-14 DIAGNOSIS — H25813 Combined forms of age-related cataract, bilateral: Secondary | ICD-10-CM | POA: Diagnosis not present

## 2015-02-17 ENCOUNTER — Encounter: Payer: Self-pay | Admitting: Cardiovascular Disease

## 2015-02-17 ENCOUNTER — Ambulatory Visit (INDEPENDENT_AMBULATORY_CARE_PROVIDER_SITE_OTHER): Payer: Medicare Other | Admitting: Cardiovascular Disease

## 2015-02-17 VITALS — BP 130/84 | HR 68 | Ht 70.0 in | Wt 203.7 lb

## 2015-02-17 DIAGNOSIS — R0609 Other forms of dyspnea: Secondary | ICD-10-CM

## 2015-02-17 DIAGNOSIS — Z79899 Other long term (current) drug therapy: Secondary | ICD-10-CM | POA: Diagnosis not present

## 2015-02-17 DIAGNOSIS — I25810 Atherosclerosis of coronary artery bypass graft(s) without angina pectoris: Secondary | ICD-10-CM | POA: Diagnosis not present

## 2015-02-17 DIAGNOSIS — E785 Hyperlipidemia, unspecified: Secondary | ICD-10-CM | POA: Diagnosis not present

## 2015-02-17 NOTE — Patient Instructions (Signed)
Your physician recommends that you return for lab work fasting.  Your physician has requested that you have en exercise stress myoview. For further information please visit [email or web address]. Please follow instruction sheet, as given.   Your physician recommends that you schedule a follow-up appointment in: 3 months.

## 2015-02-18 DIAGNOSIS — Z79899 Other long term (current) drug therapy: Secondary | ICD-10-CM | POA: Diagnosis not present

## 2015-02-18 DIAGNOSIS — I25810 Atherosclerosis of coronary artery bypass graft(s) without angina pectoris: Secondary | ICD-10-CM | POA: Diagnosis not present

## 2015-02-18 DIAGNOSIS — E785 Hyperlipidemia, unspecified: Secondary | ICD-10-CM | POA: Diagnosis not present

## 2015-02-18 LAB — LIPID PANEL
CHOL/HDL RATIO: 3.1 ratio
Cholesterol: 113 mg/dL (ref 0–200)
HDL: 36 mg/dL — AB (ref >=40)
LDL Cholesterol: 62 mg/dL (ref 0–99)
Triglycerides: 75 mg/dL (ref <150)
VLDL: 15 mg/dL (ref 0–40)

## 2015-02-18 LAB — COMPREHENSIVE METABOLIC PANEL
ALK PHOS: 31 U/L — AB (ref 39–117)
ALT: 16 U/L (ref 0–53)
AST: 18 U/L (ref 0–37)
Albumin: 4.1 g/dL (ref 3.5–5.2)
BUN: 16 mg/dL (ref 6–23)
CO2: 28 meq/L (ref 19–32)
Calcium: 9.3 mg/dL (ref 8.4–10.5)
Chloride: 101 mEq/L (ref 96–112)
Creat: 1.03 mg/dL (ref 0.50–1.35)
GLUCOSE: 151 mg/dL — AB (ref 70–99)
Potassium: 4.4 mEq/L (ref 3.5–5.3)
Sodium: 140 mEq/L (ref 135–145)
Total Bilirubin: 0.6 mg/dL (ref 0.2–1.2)
Total Protein: 6.3 g/dL (ref 6.0–8.3)

## 2015-02-18 LAB — CBC
HCT: 41.2 % (ref 39.0–52.0)
Hemoglobin: 14.1 g/dL (ref 13.0–17.0)
MCH: 30.7 pg (ref 26.0–34.0)
MCHC: 34.2 g/dL (ref 30.0–36.0)
MCV: 89.8 fL (ref 78.0–100.0)
MPV: 10.6 fL (ref 8.6–12.4)
Platelets: 137 10*3/uL — ABNORMAL LOW (ref 150–400)
RBC: 4.59 MIL/uL (ref 4.22–5.81)
RDW: 14.5 % (ref 11.5–15.5)
WBC: 5.3 10*3/uL (ref 4.0–10.5)

## 2015-02-18 LAB — TSH: TSH: 2.601 u[IU]/mL (ref 0.350–4.500)

## 2015-02-19 ENCOUNTER — Encounter: Payer: Self-pay | Admitting: Cardiovascular Disease

## 2015-02-19 DIAGNOSIS — R0609 Other forms of dyspnea: Secondary | ICD-10-CM | POA: Insufficient documentation

## 2015-02-19 NOTE — Progress Notes (Signed)
Patient ID: MAGIC MOHLER, male   DOB: December 08, 1940, 74 y.o.   MRN: [id number]     HPI:  Mr. Sebasthian Stailey is a 74 year old gentleman who is followed Dr. [name] for primary medical care.  He presents for 72monthfollowup cardiology evaluation.  Mr. YProcterhas a history of hypertension, hyperlipidemia, COPD, prior asbestos exposure, diabetes mellitus , and  prostate cancer. In January 2006, [facility] I do not have the specifics of his bypass grafts. He had [place] test [place] after he had experienced some recurrent chest pain episodes and a cardiac catheterization which showed mild blockages.  He also has a history of obstructive sleep apnea on CPAP therapy with 3 L of oxygen and is followed by Dr. CKeturah Barre  Mr. YHetzdoes admit to some shortness of breath.  He is unaware of tachycardia palpitations. He does have hyperlipidemia. In the past he had been on isosorbide mononitrate therapy but stopped taking this because of libido concerns and potential need for medications for erectile function.  An echo Doppler study on 08/12/201 showed an ejection fraction of 55-60%; mild tricuspid regurgitation, and grade 1 diastolic dysfunction. When I saw him, I recommended that we perform advanced lipid testing with NMR lipoprotein of since he has had low HDL levels and her recent cholesterol was 185 triglycerides 153 HDL 31 and LDL 123. At that time, I also started him on Ranexa 500 mg twice a day since he had discontinued his isosorbide. I added Zetia 10 mg to his atorvastatin and recommended that he undergo a followup with NMR lipoprotein assessment.  His NMR study was markedly abnormal. Specifically, his total cholesterol was 187 triglycerides 348 HDL 32 LDL cholesterol 85. However, he had markedly increased goal LDL particles at 1739 status LDL particle number was markedly increased at 2463. The patient tells me he did develop some  myalgias on Lipitor and therefore for approximately the last 3-4 weeks completely discontinued a torus that in therapy.  He was  hospitalized on December 26 through 08/21/2013 abdominal discomfort due to partial small bowel obstruction. He had experienced nausea and vomiting with diarrhea for 4 days prior to his admission and was found to have a partial mid-to high grade small bowel obstruction with a transition point in the right lower quadrant. He was cleared for surgery and if surgery was to be done he was to stop his Plavix and continue aspirin. Fortunately, his bowel obstruction resolved with medical management and surgical intervention was not necessary.  I'd seen him in January 2015 at which time he was off Plavix therapy and was not having any anginal symptoms on his current medical regimen.  He was hospitalized on 03/19/2014 through 03/21/2014 with ataxia.  He had complaints of vertigo with nausea, vomiting, double vision, numbness, and weakness.  A CT of the brain did not show acute intracranial abnormalities, but did show chronic atrophy and small vessel ischemic changes.  A 2-D echo Doppler study showed an EF of 45-50% without cardiac source of emboli.  Carotid studies revealed mild bilateral plaque with narrowing of 1-39% range.  Antegrade flow is vertebral artery.  Doppler of the brachial artery demonstrate triphasic waveforms.  He was felt possibly to have a posterior circulation TIA.  His aspirin 81 mg was increased to 325 mg.  and I last saw him in August 2015 due to his recent TIA.  I recommended reinitiation of Plavix  75 mg daily and reduced his aspirin dose back to 81 mg.  Since I saw him, he denies recent episodes of chest pain , although he did experience one short episode in February that was nonexertional.  However, he has noticed increasing exertional shortness of breath with activity.  He tells me he has stopped using his CPAP therapy.  He notes increased fatigue.  He has not seen Dr.  Annamaria Boots in some time but will be seeing him in July.  He is sleeping in an recliner.  According to his wife she has witnessed apnea spells.  He denies palpitations.  He denies PND, orthopnea.  He tells me he will be in need for cataract surgery in the near future.  He presents for evaluation.   Past Medical History  Diagnosis Date  . Hypertension   . Emphysema   . Diabetes   . Hypercholesteremia   . PONV (postoperative nausea and vomiting)   . Angina at rest   . Coronary artery disease     recently started seeing Dr. Claiborne Billings   . Arthritis   . GERD (gastroesophageal reflux disease)   . Elevated PSA   . Sleep apnea     c pap with oxygen at 2.5 L  . Prostate cancer 05/23/13    gleason 3+4=7, volume 31 cc  . Asthma   . Depression   . Anxiety   . Hx of radiation therapy 09/20/13- 11/14/13    prostate 7800 cGy 40 sessions, seminal vesicles 5600 cGy in 40 sessions    Past Surgical History  Procedure Laterality Date  . Heart bypass  2006    Quad  . Degloving rt leg injury  2000    had skin grafts  . Hernia repair  2008    x 3 w/mesh, bilat inguinla, umbilical  . Back surgery  2006    discectomy lumbar  . Tonsillectomy      w/adenoidectomy  . Prostate biopsy N/A 05/23/2013    Procedure: BIOPSY TRANSRECTAL ULTRASONIC PROSTATE (TUBP);  Surgeon: Molli Hazard, MD;  Location: [facility];  Service: Urology;  Laterality: N/A;  prostate nerve block  . Back surgery    . Foot surgery    . Back surgery      Allergies  Allergen Reactions  . Niacin And Related Itching    Current Outpatient Prescriptions  Medication Sig Dispense Refill  . albuterol (PROVENTIL HFA;VENTOLIN HFA) 108 (90 BASE) MCG/ACT inhaler Inhale 2 puffs into the lungs every 6 (six) hours as needed for wheezing. 1 Inhaler 11  . clopidogrel (PLAVIX) 75 MG tablet Take 1 tablet (75 mg total) by mouth daily. 90 tablet 3  . DULoxetine (CYMBALTA) 60 MG capsule Take 60 mg by mouth daily.    Marland Kitchen ezetimibe (ZETIA) 10 MG tablet  Take 1 tablet (10 mg total) by mouth every morning. 90 tablet 3  . Fluticasone Furoate-Vilanterol 100-25 MCG/INH AEPB Inhale 100 mcg into the lungs daily. Rinse mouth after each use. 1 each 6  . Ibuprofen-Diphenhydramine Cit ([employer] IBUPROFEN PM) 200-38 MG TABS Take by mouth. As needed    . LEVITRA 20 MG tablet Take 20 mg by mouth daily.  10  . metoprolol succinate (TOPROL-XL) 25 MG 24 hr tablet Take 1 tablet (25 mg total) by mouth daily. 90 tablet 1  . niacin 500 MG CR capsule Take 1 capsule (500 mg total) by mouth at bedtime. (Patient taking differently: Take 500 mg by mouth daily. ) 90 capsule 3  . nitroGLYCERIN (NITROSTAT)  0.3 MG SL tablet Place 0.3 mg under the tongue every 5 (five) minutes as needed for chest pain.    Marland Kitchen omega-3 acid ethyl esters (LOVAZA) 1 G capsule Take 1 capsule (1 g total) by mouth daily. 90 capsule 3  . ranolazine (RANEXA) 500 MG 12 hr tablet Take 1 tablet (500 mg total) by mouth 2 (two) times daily. 180 tablet 2  . rosuvastatin (CRESTOR) 20 MG tablet Take 1 tablet (20 mg total) by mouth daily. 90 tablet 3   No current facility-administered medications for this visit.    Social history is notable in that he is married to his 2 children ages 46 and 60. He smoked one pack of cigarettes per day until 2000. He does drink occasional alcohol. He does walk  Family History  Problem Relation Age of Onset  . Heart disease Mother   . Heart attack Mother   . Cancer Sister   . Depression Sister    ROS General: Negative; No fevers, chills, or night sweats;  HEENT: Negative; No changes in vision or hearing, sinus congestion, difficulty swallowing Pulmonary: Positive for asbestosis exposure No cough, wheezing, shortness of breath, hemoptysis Cardiovascular: Negative; No chest pain, presyncope, syncope, palpitations GI: Negative; No nausea, vomiting, diarrhea, or abdominal pain GU: Positive for prostate CA No dysuria, hematuria, or difficulty voiding Musculoskeletal: Remote  degloving injury; no myalgias, joint pain, or weakness Hematologic/Oncology: Negative; no easy bruising, bleeding Endocrine: Negative; no heat/cold intolerance; no diabetes Neuro: See history of present illness, recent ataxia, probable posterior circulation TIA Skin: Negative; No rashes or skin lesions Psychiatric: Negative; No behavioral problems, depression Sleep: Positive for obstructive sleep apnea with supplemental 3 L oxygen; he stopped using CPAP, apparently due to difficulties with his mask and potential mask leak; since he stopped using CPAP.  He notes more fatigue, daytime sleepiness,; no bruxism, restless legs, hypnogognic hallucinations, no cataplexy Other comprehensive 14 point system review is negative.   PE BP 130/84 mmHg  Pulse 68  Ht _0  (1.778 m)  Wt 203 lb 11.2 oz (92.398 kg)  BMI 29.23 kg/m2   Wt Readings from Last 3 Encounters:  02/17/15 203 lb 11.2 oz (92.398 kg)  01/22/15 204 lb (92.534 kg)  11/11/14 207 lb 6.4 oz (94.076 kg)   General: Alert, oriented, no distress.  Skin: normal turgor, no rashes HEENT: Normocephalic, atraumatic. Pupils round and reactive; sclera anicteric; Fundi no hemorrhages or exudates Nose without nasal septal hypertrophy Mouth/Parynx benign; Mallinpatti scale 3 Neck: No JVD, no carotid bruits Lungs: clear to ausculatation and percussion; no wheezing or rales Chest wall: No tenderness to palpation Heart: RRR, s1 s2 normal 1/6 systolic murmur; no diastolic murmur.  No rubs, thrills or heaves Abdomen: Mild diastases recti; soft, nontender; no hepatosplenomehaly, BS+; abdominal aorta nontender and not dilated by palpation. Back: No CVA tenderness. Pulses 2+ Extremities: no clubbinbg cyanosis or edema, Homan's sign negative  Neurologic: grossly nonfocal Psychological: Normal affect and mood; normal cognition  ECG (independently read by me): Sinus rhythm with sinus arrhythmia, heart rate averaging 68 bpm.  Intervals normal.  December  2015 ECG (independently read by me): Normal sinus rhythm with mild sinus arrhythmia and PACs with ventricular rate at 76 bpm.  QTc interval 427 ms.  No significant ST segment changes.  August 2015 ECG (independently read by me): Sinus rhythm with sinus arrhythmia.  Nonspecific T changes.  QTc interval 421 ms.  08/30/2013 ECG independently read by me today shows normal sinus rhythm with sinus arrhythmia with a  ventricular rate in the 80s. They're nonspecific ST-T changes.  Prior ECG in October 2014: Sinus rhythm with mild sinus arrhythmia at 60 beats per minute. PR interval 172 ms. QTC interval 384 ms.  LABS: BMP Latest Ref Rng 02/17/2015 03/20/2014 03/19/2014  Glucose 70 - 99 mg/dL 151(H) 152(H) 173(H)  BUN 6 - 23 mg/dL _0 Creatinine 0.50 - 1.35 mg/dL 1.03 0.90 1.01  Sodium 135 - 145 mEq/L 140 139 138  Potassium 3.5 - 5.3 mEq/L 4.4 4.1 4.2  Chloride 96 - 112 mEq/L 101 99 98  CO2 19 - 32 mEq/L _1 Calcium 8.4 - 10.5 mg/dL 9.3 8.9 8.9   Hepatic Function Latest Ref Rng 02/17/2015 03/20/2014 08/21/2013  Total Protein 6.0 - 8.3 g/dL 6.3 6.2 6.4  Albumin 3.5 - 5.2 g/dL 4.1 3.5 3.1(L)  AST 0 - 37 U/L _2 ALT 0 - 53 U/L _3 Alk Phosphatase 39 - 117 U/L 31(L) 46 35(L)  Total Bilirubin 0.2 - 1.2 mg/dL 0.6 0.3 0.6   CBC Latest Ref Rng 02/17/2015 03/20/2014 03/19/2014  WBC 4.0 - 10.5 K/uL 5.3 8.0 8.4  Hemoglobin 13.0 - 17.0 g/dL 14.1 13.2 13.1  Hematocrit 39.0 - 52.0 % 41.2 38.8(L) 37.5(L)  Platelets 150 - 400 K/uL 137(L) 130(L) PLATELET CLUMPS NOTED ON SMEAR, COUNT APPEARS DECREASED   Lab Results  Component Value Date   MCV 89.8 02/17/2015   MCV 88.2 03/20/2014   MCV 89.1 03/19/2014   Lab Results  Component Value Date   TSH 2.601 02/17/2015   Lab Results  Component Value Date   HGBA1C 6.5* 03/20/2014   Lipid Panel     Component Value Date/Time   CHOL 113 02/17/2015 0859   CHOL 118 08/01/2013 0952   TRIG 75 02/17/2015 0859   TRIG 96 08/01/2013 0952   HDL 36*  02/17/2015 0859   HDL 46 08/01/2013 0952   CHOLHDL 3.1 02/17/2015 0859   VLDL 15 02/17/2015 0859   LDLCALC 62 02/17/2015 0859   LDLCALC 53 08/01/2013 0952   RADIOLOGY: No results found.   ASSESSMENT AND PLAN: Mr. Tom Macpherson is a 74 year old gentleman with a history of diabetes mellitus, hyperlipidemia, obstructive sleep apnea  with supplemental oxygen, as well as COPD. He has a prior tobacco history but he quit on 01/17/1999. He is status post CABG surgery x4 in January 2006 and last year catheterization demonstrated some mild blockages.  With reference to his CAD, he denies recent anginal symptomatology and is tolerating Ranexa 500 mg twice a day, Toprol-XL 25 mg daily.  He is on dual platelet therapy with aspirin and Plavix.  He has a history of significant mixed hyperlipidemia and is on multiple drug regimen consisting of Crestor 20 mg, low vase a 1 g daily, niacin 500 mg, and Zetia 10 mg.  Most recent LDL today was excellent at 62 with normal triglycerides at 75, total cholesterol 113 and HDL remained mildly low at 36.  Since I last saw him, he quit using his CPAP therapy.  I believe this is contributing to his increasing fatigue.  He is sleeping in a recliner and has had witnessed apnea.  He sees Dr. Keturah Barre for his sleep apnea.  Particularly with his cardiovascular comorbidities I have strongly recommended reinstitution of treatment and he may require a new mask for improved tolerability.  He tells me he will be in need for cataract surgery.  He admits to exertional shortness of breath.  His last stress test was over 3 years ago in [place].  I am scheduling him for a nuclear perfusion study for further evaluation to make certain his exertional dyspnea is not an ischemic equivalent.  I will see him in follow-up and further recommendation made at that time.  Time spent: 25 minutes  Troy Sine, MD, [facility] 02/19/2015 9:47 PM

## 2015-02-26 ENCOUNTER — Telehealth (HOSPITAL_COMMUNITY): Payer: Self-pay

## 2015-02-26 NOTE — Telephone Encounter (Signed)
Encounter complete. 

## 2015-02-27 ENCOUNTER — Telehealth (HOSPITAL_COMMUNITY): Payer: Self-pay

## 2015-02-27 NOTE — Telephone Encounter (Signed)
Encounter complete. 

## 2015-02-28 ENCOUNTER — Ambulatory Visit (HOSPITAL_COMMUNITY)
Admission: RE | Admit: 2015-02-28 | Discharge: 2015-02-28 | Disposition: A | Discharge status: Home / Self Care | Payer: Medicare Other | Source: Ambulatory Visit | Attending: Cardiology | Admitting: Cardiology

## 2015-02-28 DIAGNOSIS — Z87891 Personal history of nicotine dependence: Secondary | ICD-10-CM | POA: Insufficient documentation

## 2015-02-28 DIAGNOSIS — Z8249 Family history of ischemic heart disease and other diseases of the circulatory system: Secondary | ICD-10-CM | POA: Diagnosis not present

## 2015-02-28 DIAGNOSIS — Z6829 Body mass index (BMI) 29.0-29.9, adult: Secondary | ICD-10-CM | POA: Diagnosis not present

## 2015-02-28 DIAGNOSIS — E119 Type 2 diabetes mellitus without complications: Secondary | ICD-10-CM | POA: Insufficient documentation

## 2015-02-28 DIAGNOSIS — E663 Overweight: Secondary | ICD-10-CM | POA: Diagnosis not present

## 2015-02-28 DIAGNOSIS — G4733 Obstructive sleep apnea (adult) (pediatric): Secondary | ICD-10-CM | POA: Insufficient documentation

## 2015-02-28 DIAGNOSIS — R0600 Dyspnea, unspecified: Secondary | ICD-10-CM | POA: Insufficient documentation

## 2015-02-28 DIAGNOSIS — I10 Essential (primary) hypertension: Secondary | ICD-10-CM | POA: Insufficient documentation

## 2015-02-28 DIAGNOSIS — J439 Emphysema, unspecified: Secondary | ICD-10-CM | POA: Diagnosis not present

## 2015-02-28 DIAGNOSIS — R112 Nausea with vomiting, unspecified: Secondary | ICD-10-CM | POA: Diagnosis not present

## 2015-02-28 DIAGNOSIS — Z8673 Personal history of transient ischemic attack (TIA), and cerebral infarction without residual deficits: Secondary | ICD-10-CM | POA: Diagnosis not present

## 2015-02-28 DIAGNOSIS — R079 Chest pain, unspecified: Secondary | ICD-10-CM | POA: Diagnosis not present

## 2015-02-28 DIAGNOSIS — I25810 Atherosclerosis of coronary artery bypass graft(s) without angina pectoris: Secondary | ICD-10-CM | POA: Diagnosis not present

## 2015-02-28 LAB — MYOCARDIAL PERFUSION IMAGING
CHL CUP MPHR: 148 {beats}/min
CHL CUP NUCLEAR SDS: 2
CHL CUP NUCLEAR SRS: 2
CHL CUP RESTING HR STRESS: 66 {beats}/min
CHL RATE OF PERCEIVED EXERTION: 15
CSEPEW: 8.3 METS
Exercise duration (min): 8 min
Exercise duration (sec): 1 s
Peak HR: 129 {beats}/min
Percent HR: 87 %
SSS: 4
TID: 1.36

## 2015-02-28 MED ORDER — TECHNETIUM TC 99M SESTAMIBI GENERIC - CARDIOLITE
30.1000 | Freq: Once | INTRAVENOUS | Status: AC | PRN
Start: 2015-02-28 — End: 2015-02-28
  Administered 2015-02-28: 30.1 via INTRAVENOUS

## 2015-02-28 MED ORDER — TECHNETIUM TC 99M SESTAMIBI GENERIC - CARDIOLITE
10.2000 | Freq: Once | INTRAVENOUS | Status: AC | PRN
  Administered 2015-02-28: 10 via INTRAVENOUS

## 2015-03-07 ENCOUNTER — Telehealth: Payer: Self-pay | Admitting: *Deleted

## 2015-03-07 NOTE — Telephone Encounter (Signed)
-----   Message from Troy Sine, MD sent at 02/28/2015  7:45 PM EDT ----- Abnl nuc; intermediate risk; mod RCA ischemia and hypotensive BP reponse; f/u ov with me

## 2015-03-07 NOTE — Telephone Encounter (Signed)
Called and notified patient of results. Per Dr. Claiborne Billings give patient appointment 1st week of August. Appointment added onto 03/26/15 schedule.

## 2015-03-10 ENCOUNTER — Telehealth: Payer: Self-pay | Admitting: Cardiovascular Disease

## 2015-03-10 NOTE — Telephone Encounter (Signed)
Please call,concerning his stress test results.

## 2015-03-10 NOTE — Telephone Encounter (Signed)
Results explained to pt, he voiced understanding - had received call the other day, wanted to make sure no indication to f/u any sooner.   Advised to call for new problems.   Questions and concerns addressed to pt's satisfaction.

## 2015-03-13 ENCOUNTER — Ambulatory Visit (INDEPENDENT_AMBULATORY_CARE_PROVIDER_SITE_OTHER): Payer: Medicare Other | Admitting: Internal Medicine

## 2015-03-13 ENCOUNTER — Encounter: Payer: Self-pay | Admitting: Internal Medicine

## 2015-03-13 VITALS — BP 116/72 | HR 70 | Ht 70.0 in | Wt 203.2 lb

## 2015-03-13 DIAGNOSIS — Z77090 Contact with and (suspected) exposure to asbestos: Secondary | ICD-10-CM

## 2015-03-13 DIAGNOSIS — J439 Emphysema, unspecified: Secondary | ICD-10-CM

## 2015-03-13 DIAGNOSIS — J449 Chronic obstructive pulmonary disease, unspecified: Secondary | ICD-10-CM

## 2015-03-13 DIAGNOSIS — G4733 Obstructive sleep apnea (adult) (pediatric): Secondary | ICD-10-CM

## 2015-03-13 DIAGNOSIS — I25810 Atherosclerosis of coronary artery bypass graft(s) without angina pectoris: Secondary | ICD-10-CM | POA: Diagnosis not present

## 2015-03-13 MED ORDER — ALBUTEROL SULFATE (2.5 MG/3ML) 0.083% IN NEBU: 2.5000 mg | INHALATION_SOLUTION | Freq: Four times a day (QID) | RESPIRATORY_TRACT | Status: DC

## 2015-03-13 NOTE — Progress Notes (Signed)
55 yoM self referred for  COPD with emphysema diagnosed 3419, complicated by CAD/CABG 4V, DM.       Here with wife Smoked one pack per day until quitting in 2000. Retired from [employer] with associated dust. 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 He says this was associated with asbestos exposure from brake dust. Issues are in legal consideration and he notes that no diagnosis has been made. Has had pneumonia twice, and pneumonia vaccine. History of sleep apnea diagnosis but quit CPAP. Wife confirms he snores wearing a chin strap and sleeping in a recliner. Significant injury at work 01/17/1999, broken tibia and "degloving" of R  lower leg.  01/10/13- self referred for  COPD with emphysema diagnosed 3790, OSA, complicated by CAD/CABG 4V, DM, hx major trauma leg.       Here with wife Mowing the yard with mask in dusty but will still cough. Wife says if he lies down a recliner he holds his breath saturation will drop to 86%.he says he has been wearing his CPAP again/Advanced, but she describes apneas when he is sleeping without it.  PFT 01/01/2013: Minimal obstructive airways disease, air trapping with increased residual volume, mild response to bronchodilator, diffusion slightly reduced. FVC 4.11/96%, FEV1 2.59/82%, FEV1/FVC 0.63/85%, FEF 25-75% 1.63/68%. TLC 100%, DLCO 76%. CXR 11/23/12 IMPRESSION:  Underlying emphysema. No edema or consolidation.  Original Report Authenticated By: Lowella Grip, M.D.  03/05/13- self referred for  COPD with emphysema diagnosed 2409, OSA, complicated by CAD/CABG 4V, DM, hx major trauma leg.       Here with wife FOLLOWS FOR: pt reports breathing is doing well-- states concentrator is making a "racket and is on its last leg"-- denies any other concerns at this time. CPAP Autopap/ Advanced Mask hurts bridge of nose if he stretches it on too tightly. Oxygen concentrator is getting loud. Compliance okay but inadequate control  based on download. Needs broader pressure range for autotitration.  09/04/13- 67 yoM former smoker self referred for  COPD with emphysema, hx asbestos exposure, OSA, complicated by CAD/CABG 4V, DM, hx major trauma leg.       Here with wife CPAP AutoPAP Advanced- not using-blows too hard. Does wearing oxygen 3 L/Advanced for sleep. Wife says she rarely notices apneas now when he is not wearing CPAP. FOLLOWS FOR: Pt states breathing is doing well since last visit; recent hospital for intestine blockage. Pending XRT for prostate cancer  11/20/13- 72 yoM former smoker self referred for  COPD with emphysema, hx asbestos exposure, OSA, complicated by CAD/CABG 4V, DM, hx major trauma leg, prostate Ca.       Here with wife FOLLOWS FOR: wears CPAPAuto or 10?/ Advanced every night for about 6 hours; Pt states [facility] did ONO since visit off CPAP- seeking report Pressure working well for patient; DME is [facility], Still using oxygen at 3 L. No recent nitroglycerin. Not using inhalers. Recent hospital for small bowel obstruction.   05/13/14- 86 yoM former smoker self referred for  COPD with emphysema, hx asbestos exposure, OSA, complicated by CAD/CABG 4V, DM, hx major trauma leg, prostate Ca.       Here with wife follows for: Pt states he was off CPAP auto/+ O2 3L Advanced for several months while getting XRT for prostate cancer, but started back x 2 weeks ago. He is wearing it every night for about 8 hours a night.  Sleeps in a recliner. COPD: Pt denies any increase in SOB,  cough at this time.  CXR 03/19/14 IMPRESSION:  No acute abnormalities.  Electronically Signed  By: Rozetta Nunnery M.D.  On: 03/19/2014 20:24  11/11/14-73 yoM former smoker self referred for  COPD with emphysema, hx asbestos exposure, OSA, complicated by CAD/CABG 4V, DM, hx major trauma leg, prostate Ca.       Here with wife FOLLOWS FOR: Has not worn CPAP in a while; pt states Memory Dance is helping with breathing,  03/13/15- 14 yoM former smoker self referred  for  COPD with emphysema, hx asbestos exposure, OSA/ failed CPAP, complicated by CAD/CABG 4V, DM, hx major trauma leg, prostate Ca.       Here with wife FOLLOWS FOR: Pt states he is wearing 3 lpm/  nightly. Not wearing CPAP. Pt denies any current complaints.  FOLLOWS FOR: Pt not taking Breo daily, pt taking it as needed. Pt states he has noticed he is more SOB with the humid weather. Pt denies cough and CP/tightness. Pt had a recent stress test on 7.8.16 and is scheduled to see Dr. Claiborne Billings 8.3.16 He dislikes CPAP and stopped using it. Sleeps sitting up in a recliner and wearing oxygen. His cardiologist, Dr. Claiborne Billings, was concerned about his sleep apnea status and recommended a repeat sleep study. With discussion here and pressure from his wife, the patient reluctantly agrees.  ROS-see HPI Constitutional:   No-   weight loss, night sweats, fevers, chills, +fatigue, lassitude. HEENT:   No-  headaches, difficulty swallowing, tooth/dental problems, sore throat,       No-  sneezing, itching, ear ache, nasal congestion, post nasal drip,  CV:  No-   chest pain, orthopnea, PND, swelling in lower extremities, anasarca, dizziness, palpitations Resp: + shortness of breath with exertion or at rest.              No-   productive cough,  + non-productive cough,  No- coughing up of blood.              No-   change in color of mucus.  No- wheezing.   Skin: No-   rash or lesions. GI:  No-   heartburn, indigestion, abdominal pain, nausea, vomiting, GU:  MS:  No-   joint pain or swelling.  . Neuro-     nothing unusual Psych:  No- change in mood or affect. + depression or anxiety.  No memory loss.  OBJ- Physical Exam General- Alert, Oriented, Affect-appropriate, Distress- none acute. overweight Skin- rash-none, lesions- none, excoriation- none Lymphadenopathy- none Head- atraumatic            Eyes- Gross vision intact, PERRLA, conjunctivae and secretions clear            Ears- Hearing, canals-normal             Nose-  Clear, no-Septal dev, mucus, polyps, erosion, perforation             Throat- Mallampati II , mucosa clear , drainage- none, tonsils- atrophic Neck- flexible , trachea midline, no stridor , thyroid nl, carotid no bruit Chest - symmetrical excursion , unlabored           Heart/CV- RRR , no murmur , no gallop  , no rub, nl s1 s2                           - JVD- none , edema- none, stasis changes- none, varices- none           Lung- +  diminished but clear to P&A, wheeze- none, cough + dry , dullness-none, rub- none           Chest wall-  Abd-  Br/ Gen/ Rectal- Not done, not indicated Extrem- cyanosis- none, clubbing, none, atrophy- none, strength- nl. -+cane. Support stockings Neuro- grossly intact to observation

## 2015-03-13 NOTE — Patient Instructions (Addendum)
Order- CXR   Dx COPD, asbestos exposure  Order- schedule NPSG split protocol   Dx OSA  Apply oxygen if needed per protocol     Add On: Rx for Albuterol 0.083% #75 ml 1 vial every 6 hours as directed with 11 refills    DME: [facility]

## 2015-03-14 NOTE — Assessment & Plan Note (Signed)
Plan-update chest x-ray

## 2015-03-14 NOTE — Assessment & Plan Note (Signed)
Mild obstructive airways disease mainly in small airways. His mild dry cough may be associated. He is not on an ACE inhibitor.

## 2015-03-14 NOTE — Assessment & Plan Note (Signed)
We were never able to get him compliant with CPAP on a sustained basis. He prefers to sleep in a chair and wear oxygen. I have educated him and his wife on these issues. With Dr. Evette Georges encouragement, he is now willing to have a repeat sleep study Plan-schedule an update sleep study

## 2015-03-26 ENCOUNTER — Encounter: Payer: Self-pay | Admitting: Cardiovascular Disease

## 2015-03-26 ENCOUNTER — Ambulatory Visit (INDEPENDENT_AMBULATORY_CARE_PROVIDER_SITE_OTHER): Payer: Medicare Other | Admitting: Cardiovascular Disease

## 2015-03-26 VITALS — BP 128/76 | HR 72 | Ht 70.0 in | Wt 204.4 lb

## 2015-03-26 DIAGNOSIS — R9439 Abnormal result of other cardiovascular function study: Secondary | ICD-10-CM | POA: Insufficient documentation

## 2015-03-26 DIAGNOSIS — R0609 Other forms of dyspnea: Secondary | ICD-10-CM | POA: Diagnosis not present

## 2015-03-26 DIAGNOSIS — E785 Hyperlipidemia, unspecified: Secondary | ICD-10-CM | POA: Diagnosis not present

## 2015-03-26 DIAGNOSIS — R931 Abnormal findings on diagnostic imaging of heart and coronary circulation: Secondary | ICD-10-CM

## 2015-03-26 DIAGNOSIS — G4733 Obstructive sleep apnea (adult) (pediatric): Secondary | ICD-10-CM

## 2015-03-26 DIAGNOSIS — I2510 Atherosclerotic heart disease of native coronary artery without angina pectoris: Secondary | ICD-10-CM | POA: Diagnosis not present

## 2015-03-26 DIAGNOSIS — I2583 Coronary atherosclerosis due to lipid rich plaque: Secondary | ICD-10-CM

## 2015-03-26 MED ORDER — ISOSORBIDE MONONITRATE ER 30 MG PO TB24: 30.0000 mg | ORAL_TABLET | Freq: Every day | ORAL | Status: DC

## 2015-03-26 MED ORDER — RANOLAZINE ER 1000 MG PO TB12: 1000.0000 mg | ORAL_TABLET | Freq: Two times a day (BID) | ORAL | Status: DC

## 2015-03-26 NOTE — Progress Notes (Signed)
Patient ID: Daniel Singleton, male   DOB: Apr 22, 1941, 74 y.o.   MRN: [id number]     HPI:  [name] is a 74 year old gentleman who is followed Dr. [name] for primary medical care.  He presents for cardiology evaluation  in follow-up of his recent nuclear perfusion study.   Daniel Singleton has a history of hypertension, hyperlipidemia, COPD, prior asbestos exposure, diabetes mellitus , and  prostate cancer. In January 2006, he underwent CABG surgery x4  in [facility] by Dr. Zorita Pang. I do not have the specifics of his bypass grafts. He had stress test in [place] in 2013 after he had experienced some recurrent chest pain episodes and a cardiac catheterization which showed mild blockages.  He also has a history of obstructive sleep apnea on CPAP therapy with 3 L of oxygen and is followed by Dr. Keturah Barre.  Daniel Singleton admits to some shortness of breath.  He is unaware of tachycardia palpitations. He does have hyperlipidemia. In the past he had been on isosorbide mononitrate therapy but stopped taking this because of libido concerns and potential need for medications for erectile function.  An echo Doppler study on 04/03/2013 showed an ejection fraction of 55-60%; mild tricuspid regurgitation, and grade 1 diastolic dysfunction. When I saw him, I recommended that we perform advanced lipid testing with NMR lipoprotein of since he has had low HDL levels and her recent cholesterol was 185 triglycerides 153 HDL 31 and LDL 123. At that time, I also started him on Ranexa 500 mg twice a day since he had discontinued his isosorbide. I added Zetia 10 mg to his atorvastatin and recommended that he undergo a followup with NMR lipoprotein assessment.  His NMR study was markedly abnormal. Specifically, his total cholesterol was 187 triglycerides 348 HDL 32 LDL cholesterol 85. However, he had markedly increased goal LDL particles at 1739 status LDL particle number was markedly increased at 2463. The  patient tells me he did develop some myalgias on Lipitor and therefore for approximately the last 3-4 weeks completely discontinued a torus that in therapy.  He was  hospitalized on December 26 through 08/21/2013 abdominal discomfort due to partial small bowel obstruction. He had experienced nausea and vomiting with diarrhea for 4 days prior to his admission and was found to have a partial mid-to high grade small bowel obstruction with a transition point in the right lower quadrant. He was cleared for surgery and if surgery was to be done he was to stop his Plavix and continue aspirin. Fortunately, his bowel obstruction resolved with medical management and surgical intervention was not necessary.  I'd seen him in January 2015 at which time he was off Plavix therapy and was not having any anginal symptoms on his current medical regimen.  He was hospitalized on 03/19/2014 through 03/21/2014 with ataxia.  He had complaints of vertigo with nausea, vomiting, double vision, numbness, and weakness.  A CT of the brain did not show acute intracranial abnormalities, but did show chronic atrophy and small vessel ischemic changes.  A 2-D echo Doppler study showed an EF of 45-50% without cardiac source of emboli.  Carotid studies revealed mild bilateral plaque with narrowing of 1-39% range.  Antegrade flow is vertebral artery.  Doppler of the brachial artery demonstrate triphasic waveforms.  He was felt possibly to have a posterior circulation TIA.  His aspirin 81 mg was increased to 325 mg.  and I last saw him in August 2015 due to his recent  TIA.  I recommended reinitiation of Plavix 75 mg daily and reduced his aspirin dose back to 81 mg.  When I saw several months ago he denied any significant chest pain but had experienced one short episode in February that was nonexertional.  However, he had noticed increasing exertional shortness of breath with activity.  He had stopped using his CPAP therapy and complained of  increased fatigue.  He had not seen Dr. Annamaria Boots in some time and I recommended that when he saw him in July that consideration for follow-up sleep study.  He at home.  At that time, he was planning to undergo cataract surgery sometime this fall.  Due to his exertional shortness of breath and his established coronary artery disease.  I recommended a nuclear perfusion study.  This was done on 02/28/2015 and was intermediate risk.  He developed mild ST segment depression and had occasional PACs and PVCs.  Scintigraphic images revealed a medium defect of moderate severity in the RCA distribution.  He also had an abnormal blood pressure response with hypotension during stress.  Presently, he denies chest pain.  He denies any worsening of his exertional dyspnea.  He presents for evaluation.  Past Medical History  Diagnosis Date  . Hypertension   . Emphysema   . Diabetes   . Hypercholesteremia   . PONV (postoperative nausea and vomiting)   . Angina at rest   . Coronary artery disease     recently started seeing Dr. Claiborne Billings   . Arthritis   . GERD (gastroesophageal reflux disease)   . Elevated PSA   . Sleep apnea     c pap with oxygen at 2.5 L  . Prostate cancer 05/23/13    gleason 3+4=7, volume 31 cc  . Asthma   . Depression   . Anxiety   . Hx of radiation therapy 09/20/13- 11/14/13    prostate 7800 cGy 40 sessions, seminal vesicles 5600 cGy in 40 sessions    Past Surgical History  Procedure Laterality Date  . Heart bypass  2006    Quad  . Degloving rt leg injury  2000    had skin grafts  . Hernia repair  2008    x 3 w/mesh, bilat inguinla, umbilical  . Back surgery  2006    discectomy lumbar  . Tonsillectomy      w/adenoidectomy  . Prostate biopsy N/A 05/23/2013    Procedure: BIOPSY TRANSRECTAL ULTRASONIC PROSTATE (TUBP);  Surgeon: Molli Hazard, MD;  Location: [facility];  Service: Urology;  Laterality: N/A;  prostate nerve block  . Back surgery    . Foot surgery    . Back surgery       Allergies  Allergen Reactions  . Niacin And Related Itching    Current Outpatient Prescriptions  Medication Sig Dispense Refill  . albuterol (PROVENTIL HFA;VENTOLIN HFA) 108 (90 BASE) MCG/ACT inhaler Inhale 2 puffs into the lungs every 6 (six) hours as needed for wheezing. 1 Inhaler 11  . albuterol (PROVENTIL) (2.5 MG/3ML) 0.083% nebulizer solution Take 3 mLs (2.5 mg total) by nebulization every 6 (six) hours. As directed   DX: COPD emphysema 75 mL 11  . clopidogrel (PLAVIX) 75 MG tablet Take 1 tablet (75 mg total) by mouth daily. 90 tablet 3  . DULoxetine (CYMBALTA) 60 MG capsule Take 60 mg by mouth daily.    Marland Kitchen ezetimibe (ZETIA) 10 MG tablet Take 1 tablet (10 mg total) by mouth every morning. 90 tablet 3  . Fluticasone Furoate-Vilanterol 100-25  MCG/INH AEPB Inhale 100 mcg into the lungs daily. Rinse mouth after each use. 1 each 6  . Ibuprofen-Diphenhydramine Cit ([employer] IBUPROFEN PM) 200-38 MG TABS Take by mouth. As needed    . LEVITRA 20 MG tablet Take 20 mg by mouth daily as needed.   10  . metoprolol succinate (TOPROL-XL) 25 MG 24 hr tablet Take 1 tablet (25 mg total) by mouth daily. 90 tablet 1  . niacin 500 MG CR capsule Take 1 capsule (500 mg total) by mouth at bedtime. (Patient taking differently: Take 500 mg by mouth daily. ) 90 capsule 3  . nitroGLYCERIN (NITROSTAT) 0.3 MG SL tablet Place 0.3 mg under the tongue every 5 (five) minutes as needed for chest pain.    Marland Kitchen omega-3 acid ethyl esters (LOVAZA) 1 G capsule Take 1 capsule (1 g total) by mouth daily. 90 capsule 3  . rosuvastatin (CRESTOR) 20 MG tablet Take 1 tablet (20 mg total) by mouth daily. 90 tablet 3  . isosorbide mononitrate (IMDUR) 30 MG 24 hr tablet Take 1 tablet (30 mg total) by mouth daily. 30 tablet 3  . ranolazine (RANEXA) 1000 MG SR tablet Take 1 tablet (1,000 mg total) by mouth 2 (two) times daily. 180 tablet 3   No current facility-administered medications for this visit.    Social history is notable in  that he is married to his 2 children ages 42 and 103. He smoked one pack of cigarettes per day until 2000. He does drink occasional alcohol. He does walk  Family History  Problem Relation Age of Onset  . Heart disease Mother   . Heart attack Mother   . Cancer Sister   . Depression Sister    ROS General: Negative; No fevers, chills, or night sweats;  HEENT: Negative; No changes in vision or hearing, sinus congestion, difficulty swallowing Pulmonary: Positive for asbestosis exposure No cough, wheezing, shortness of breath, hemoptysis Cardiovascular: Negative; No chest pain, presyncope, syncope, palpitations GI: Negative; No nausea, vomiting, diarrhea, or abdominal pain GU: Positive for prostate CA No dysuria, hematuria, or difficulty voiding Musculoskeletal: Remote degloving injury; no myalgias, joint pain, or weakness Hematologic/Oncology: Negative; no easy bruising, bleeding Endocrine: Negative; no heat/cold intolerance; no diabetes Neuro: See history of present illness, recent ataxia, probable posterior circulation TIA Skin: Negative; No rashes or skin lesions Psychiatric: Negative; No behavioral problems, depression Sleep: Positive for obstructive sleep apnea with supplemental 3 L oxygen; he stopped using CPAP, apparently due to difficulties with his mask and potential mask leak; since he stopped using CPAP.  He notes more fatigue, daytime sleepiness,; no bruxism, restless legs, hypnogognic hallucinations, no cataplexy Other comprehensive 14 point system review is negative.   PE BP 128/76 mmHg  Pulse 72  Ht 5' 10"  (1.778 m)  Wt 204 lb 6.4 oz (92.715 kg)  BMI 29.33 kg/m2   Wt Readings from Last 3 Encounters:  03/26/15 204 lb 6.4 oz (92.715 kg)  03/13/15 203 lb 3.2 oz (92.171 kg)  02/28/15 203 lb (92.08 kg)   General: Alert, oriented, no distress.  Skin: normal turgor, no rashes HEENT: Normocephalic, atraumatic. Pupils round and reactive; sclera anicteric; Fundi no hemorrhages  or exudates Nose without nasal septal hypertrophy Mouth/Parynx benign; Mallinpatti scale 3 Neck: No JVD, no carotid bruits Lungs: clear to ausculatation and percussion; no wheezing or rales Chest wall: No tenderness to palpation Heart: RRR, s1 s2 normal 1/6 systolic murmur; no diastolic murmur.  No rubs, thrills or heaves Abdomen: Mild diastases recti; soft, nontender; no  hepatosplenomehaly, BS+; abdominal aorta nontender and not dilated by palpation. Back: No CVA tenderness. Pulses 2+ Extremities: no clubbinbg cyanosis or edema, Homan's sign negative  Neurologic: grossly nonfocal Psychological: Normal affect and mood; normal cognition  June 2016 ECG (independently read by me): Sinus rhythm with sinus arrhythmia, heart rate averaging 68 bpm.  Intervals normal.  December 2015 ECG (independently read by me): Normal sinus rhythm with mild sinus arrhythmia and PACs with ventricular rate at 76 bpm.  QTc interval 427 ms.  No significant ST segment changes.  August 2015 ECG (independently read by me): Sinus rhythm with sinus arrhythmia.  Nonspecific T changes.  QTc interval 421 ms.  08/30/2013 ECG independently read by me today shows normal sinus rhythm with sinus arrhythmia with a ventricular rate in the 80s. They're nonspecific ST-T changes.  Prior ECG in October 2014: Sinus rhythm with mild sinus arrhythmia at 60 beats per minute. PR interval 172 ms. QTC interval 384 ms.  LABS: BMP Latest Ref Rng 02/17/2015 03/20/2014 03/19/2014  Glucose 70 - 99 mg/dL 151(H) 152(H) 173(H)  BUN 6 - 23 mg/dL 16 11 12   Creatinine 0.50 - 1.35 mg/dL 1.03 0.90 1.01  Sodium 135 - 145 mEq/L 140 139 138  Potassium 3.5 - 5.3 mEq/L 4.4 4.1 4.2  Chloride 96 - 112 mEq/L 101 99 98  CO2 19 - 32 mEq/L 28 24 28   Calcium 8.4 - 10.5 mg/dL 9.3 8.9 8.9   Hepatic Function Latest Ref Rng 02/17/2015 03/20/2014 08/21/2013  Total Protein 6.0 - 8.3 g/dL 6.3 6.2 6.4  Albumin 3.5 - 5.2 g/dL 4.1 3.5 3.1(L)  AST 0 - 37 U/L 18 19 23     ALT 0 - 53 U/L 16 18 20   Alk Phosphatase 39 - 117 U/L 31(L) 46 35(L)  Total Bilirubin 0.2 - 1.2 mg/dL 0.6 0.3 0.6   CBC Latest Ref Rng 02/17/2015 03/20/2014 03/19/2014  WBC 4.0 - 10.5 K/uL 5.3 8.0 8.4  Hemoglobin 13.0 - 17.0 g/dL 14.1 13.2 13.1  Hematocrit 39.0 - 52.0 % 41.2 38.8(L) 37.5(L)  Platelets 150 - 400 K/uL 137(L) 130(L) PLATELET CLUMPS NOTED ON SMEAR, COUNT APPEARS DECREASED   Lab Results  Component Value Date   MCV 89.8 02/17/2015   MCV 88.2 03/20/2014   MCV 89.1 03/19/2014   Lab Results  Component Value Date   TSH 2.601 02/17/2015   Lab Results  Component Value Date   HGBA1C 6.5* 03/20/2014   Lipid Panel     Component Value Date/Time   CHOL 113 02/17/2015 0859   CHOL 118 08/01/2013 0952   TRIG 75 02/17/2015 0859   TRIG 96 08/01/2013 0952   HDL 36* 02/17/2015 0859   HDL 46 08/01/2013 0952   CHOLHDL 3.1 02/17/2015 0859   VLDL 15 02/17/2015 0859   LDLCALC 62 02/17/2015 0859   LDLCALC 53 08/01/2013 0952   RADIOLOGY: No results found.   ASSESSMENT AND PLAN: Daniel Singleton is a 74 year old gentleman with a history of diabetes mellitus, hyperlipidemia, obstructive sleep apnea  with supplemental oxygen, as well as COPD. He has a prior tobacco history but he quit on 01/17/1999. He is status post CABG surgery x4 in January 2006 and in 2013 wall, still living in [place] had abnormal nuclear study and repeat catheterization demonstrated some mild blockages.  I reviewed his most recent nuclear study in detail.  The present study raises the suspicion for ischemia in the RCA territory.  He had a mild hypotensive response to exercise and had mild ST changes.  He denies any chest pressure.  He had an abnormal stress test in 2013 which led to his repeat cardiac catheterization.  I do not know if these ECG changes and nuclear abnormality is new or consistent with his 2013 study.  I will try to obtain the results of his nuclear study and cardiac catheterization from 2013.  In  the interim, I'm increasing his Ranexa to 1000 mg twice a day.  Am starting him isosorbide mononitrate 30 mg daily.  He will need to discontinue his Levitra which he takes only on the when necessary basis for erectile dysfunction.  If he continues to spirits increasing symptomatology.  Definitive repeat cardiac catheterization will be undertaken.  I did review laboratory from June.  His lipid panel is excellent with an LDL cholesterol at 62 on his current dose of Crestor 20 mg, Zetia 10 mg, and omega-3 fatty acids.  He is continuing on aspirin and Plavix doing a platelet therapy.  He does have COPD and takes allopurinol.  He is will be undergoing a follow-up sleep study next month.  I will see him in 3 weeks for cardiology reevaluation.  Time spent: 25 minutes  Troy Sine, MD, [facility] 03/26/2015 11:21 AM

## 2015-03-26 NOTE — Patient Instructions (Addendum)
Your physician has recommended you make the following change in your medication: the Ranexa has been increased to 1000 mg  Twice a day. Start new prescription for isosorbide. These prescriptions have already been sent to your pharmacy.   Your physician recommends that you schedule a work-in appointment in: August 30th with Dr. Claiborne Billings.

## 2015-04-14 DIAGNOSIS — Z125 Encounter for screening for malignant neoplasm of prostate: Secondary | ICD-10-CM | POA: Diagnosis not present

## 2015-04-14 DIAGNOSIS — I10 Essential (primary) hypertension: Secondary | ICD-10-CM | POA: Diagnosis not present

## 2015-04-14 DIAGNOSIS — E785 Hyperlipidemia, unspecified: Secondary | ICD-10-CM | POA: Diagnosis not present

## 2015-04-14 DIAGNOSIS — E119 Type 2 diabetes mellitus without complications: Secondary | ICD-10-CM | POA: Diagnosis not present

## 2015-04-21 DIAGNOSIS — Z6829 Body mass index (BMI) 29.0-29.9, adult: Secondary | ICD-10-CM | POA: Diagnosis not present

## 2015-04-21 DIAGNOSIS — E1140 Type 2 diabetes mellitus with diabetic neuropathy, unspecified: Secondary | ICD-10-CM | POA: Diagnosis not present

## 2015-04-21 DIAGNOSIS — E785 Hyperlipidemia, unspecified: Secondary | ICD-10-CM | POA: Diagnosis not present

## 2015-04-21 DIAGNOSIS — Z0000 Encounter for general adult medical examination without abnormal findings: Secondary | ICD-10-CM | POA: Diagnosis not present

## 2015-04-21 DIAGNOSIS — I10 Essential (primary) hypertension: Secondary | ICD-10-CM | POA: Diagnosis not present

## 2015-04-21 DIAGNOSIS — F418 Other specified anxiety disorders: Secondary | ICD-10-CM | POA: Diagnosis not present

## 2015-04-21 DIAGNOSIS — Z1212 Encounter for screening for malignant neoplasm of rectum: Secondary | ICD-10-CM | POA: Diagnosis not present

## 2015-04-21 DIAGNOSIS — G4733 Obstructive sleep apnea (adult) (pediatric): Secondary | ICD-10-CM | POA: Diagnosis not present

## 2015-04-21 DIAGNOSIS — Z1389 Encounter for screening for other disorder: Secondary | ICD-10-CM | POA: Diagnosis not present

## 2015-04-21 DIAGNOSIS — E119 Type 2 diabetes mellitus without complications: Secondary | ICD-10-CM | POA: Diagnosis not present

## 2015-04-21 DIAGNOSIS — J449 Chronic obstructive pulmonary disease, unspecified: Secondary | ICD-10-CM | POA: Diagnosis not present

## 2015-04-21 DIAGNOSIS — I25729 Atherosclerosis of autologous artery coronary artery bypass graft(s) with unspecified angina pectoris: Secondary | ICD-10-CM | POA: Diagnosis not present

## 2015-04-21 DIAGNOSIS — G459 Transient cerebral ischemic attack, unspecified: Secondary | ICD-10-CM | POA: Diagnosis not present

## 2015-04-22 ENCOUNTER — Encounter: Payer: Self-pay | Admitting: Cardiovascular Disease

## 2015-04-22 ENCOUNTER — Ambulatory Visit (INDEPENDENT_AMBULATORY_CARE_PROVIDER_SITE_OTHER): Payer: Medicare Other | Admitting: Cardiovascular Disease

## 2015-04-22 VITALS — BP 124/72 | HR 77 | Ht 70.0 in | Wt 204.0 lb

## 2015-04-22 DIAGNOSIS — R931 Abnormal findings on diagnostic imaging of heart and coronary circulation: Secondary | ICD-10-CM | POA: Diagnosis not present

## 2015-04-22 DIAGNOSIS — E785 Hyperlipidemia, unspecified: Secondary | ICD-10-CM

## 2015-04-22 DIAGNOSIS — I2510 Atherosclerotic heart disease of native coronary artery without angina pectoris: Secondary | ICD-10-CM | POA: Diagnosis not present

## 2015-04-22 DIAGNOSIS — R9439 Abnormal result of other cardiovascular function study: Secondary | ICD-10-CM

## 2015-04-22 DIAGNOSIS — I2583 Coronary atherosclerosis due to lipid rich plaque: Principal | ICD-10-CM

## 2015-04-22 MED ORDER — METOPROLOL SUCCINATE ER 50 MG PO TB24: 50.0000 mg | ORAL_TABLET | Freq: Every day | ORAL | Status: DC

## 2015-04-22 NOTE — Patient Instructions (Signed)
Your physician has recommended you make the following change in your medication: the metoprolol has been increased to 50 mg daily. You can take 2 of the 25 mg tablets until gone. Then start new prescription given for 50 mg tablets. The new prescription has been sent to your pharmacy.   Your physician recommends that you schedule a follow-up appointment in: December 2016.

## 2015-04-24 ENCOUNTER — Encounter: Payer: Self-pay | Admitting: Cardiovascular Disease

## 2015-04-24 NOTE — Progress Notes (Signed)
Patient ID: Daniel Singleton, male   DOB: [birth year], 74 y.o.   MRN: [id number]     HPI:  Daniel Singleton is a 74 year old gentleman who is followed Dr. [name] for primary medical care.  He presents for follow-up cardiology evaluation.  Daniel Singleton has a history of hypertension, hyperlipidemia, COPD, prior asbestos exposure, diabetes mellitus , and  prostate cancer. In January 2006, he underwent CABG surgery x4  in [facility] by Dr. Zorita Pang. I do not have the specifics of his bypass grafts. He had stress test in [place] in 2013 after he had experienced some recurrent chest pain episodes and a cardiac catheterization which showed mild blockages.  He also has a history of obstructive sleep apnea on CPAP therapy with 3 L of oxygen and is followed by Dr. Keturah Barre.  Daniel Singleton admits to mild shortness of breath.he denies palpitations.  He has a history of hyperlipidemia.   In the past he had been on isosorbide mononitrate therapy but stopped taking this because of libido concerns and potential need for medications for erectile function.  An echo Doppler study on 04/03/2013 showed an ejection fraction of 55-60%; mild tricuspid regurgitation, and grade 1 diastolic dysfunction. When I saw him, I recommended that we perform advanced lipid testing with NMR lipoprotein of since he has had low HDL levels and her recent cholesterol was 185 triglycerides 153 HDL 31 and LDL 123. At that time, I also started him on Ranexa 500 mg twice a day since he had discontinued his isosorbide. I added Zetia 10 mg to his atorvastatin and recommended that he undergo a followup with NMR lipoprotein assessment.  His NMR study was markedly abnormal. Specifically, his total cholesterol was 187 triglycerides 348 HDL 32 LDL cholesterol 85. However, he had markedly increased goal LDL particles at 1739 status LDL particle number was markedly increased at 2463. The patient tells me he did develop some myalgias on Lipitor  and therefore for approximately the last 3-4 weeks completely discontinued a torus that in therapy.  He was  hospitalized on December 26 through 08/21/2013 abdominal discomfort due to partial small bowel obstruction. He had experienced nausea and vomiting with diarrhea for 4 days prior to his admission and was found to have a partial mid-to high grade small bowel obstruction with a transition point in the right lower quadrant. He was cleared for surgery and if surgery was to be done he was to stop his Plavix and continue aspirin. Fortunately, his bowel obstruction resolved with medical management and surgical intervention was not necessary.  I'd seen him in January 2015 at which time he was off Plavix therapy and was not having any anginal symptoms on his current medical regimen.  He was hospitalized on 03/19/2014 through 03/21/2014 with ataxia.  He had complaints of vertigo with nausea, vomiting, double vision, numbness, and weakness.  A CT of the brain did not show acute intracranial abnormalities, but did show chronic atrophy and small vessel ischemic changes.  A 2-D echo Doppler study showed an EF of 45-50% without cardiac source of emboli.  Carotid studies revealed mild bilateral plaque with narrowing of 1-39% range.  Antegrade flow is vertebral artery.  Doppler of the brachial artery demonstrate triphasic waveforms.  He was felt possibly to have a posterior circulation TIA.  His aspirin 81 mg was increased to 325 mg.  and I last saw him in August 2015 due to his recent TIA.  I recommended reinitiation of Plavix 75 mg  daily and reduced his aspirin dose back to 81 mg.  When I saw several months ago he denied any significant chest pain but had experienced one short episode in February that was nonexertional.  However, he had noticed increasing exertional shortness of breath with activity.  He had stopped using his CPAP therapy and complained of increased fatigue.  He had not seen Dr. Annamaria Boots in some time and  I recommended that when he saw him in July that consideration for follow-up sleep study.  He at home.  At that time, he was planning to undergo cataract surgery sometime this fall.  Due to his exertional shortness of breath and his established coronary artery disease.  I recommended a nuclear perfusion study.  This was done on 02/28/2015 and was intermediate risk.  He developed mild ST segment depression and had occasional PACs and PVCs.  Scintigraphic images revealed a medium defect of moderate severity in the RCA distribution.  He also had an abnormal blood pressure response with hypotension during stress.  I saw him a proximally 1 month ago at which time he was remaining completely asymptomatic.  Since that time, I was able to obtain the records from [facility] to see if he had any significant inferior defect on prior nuclear studies.  Review his records dating back to 2006 including catheterization studies and subsequent nuclear imaging studies.  On the majority of his nuclear studies dating from 2007.  He was found to have mild fixed basal posterior lateral defect and there was some concern of possible attenuation artifact.  He was not found to have significant ischemia.  A nuclear study in 2011 was interpreted as indeterminate for ischemia with motion artifact.  His last study in [place] was in 2013 which again showed a fixed inferior defect and was felt that he had a low probability of ischemia.  Daniel Singleton continues to be completely asymptomatic.  He specifically denies chest pain, or any change in symptomatology.  He presents for reevaluation.  Past Medical History  Diagnosis Date  . Hypertension   . Emphysema   . Diabetes   . Hypercholesteremia   . PONV (postoperative nausea and vomiting)   . Angina at rest   . Coronary artery disease     recently started seeing Dr. Claiborne Billings   . Arthritis   . GERD (gastroesophageal reflux disease)   . Elevated PSA   . Sleep apnea     c pap with oxygen at 2.5  L  . Prostate cancer 05/23/13    gleason 3+4=7, volume 31 cc  . Asthma   . Depression   . Anxiety   . Hx of radiation therapy 09/20/13- 11/14/13    prostate 7800 cGy 40 sessions, seminal vesicles 5600 cGy in 40 sessions    Past Surgical History  Procedure Laterality Date  . Heart bypass  2006    Quad  . Degloving rt leg injury  2000    had skin grafts  . Hernia repair  2008    x 3 w/mesh, bilat inguinla, umbilical  . Back surgery  2006    discectomy lumbar  . Tonsillectomy      w/adenoidectomy  . Prostate biopsy N/A 05/23/2013    Procedure: BIOPSY TRANSRECTAL ULTRASONIC PROSTATE (TUBP);  Surgeon: Molli Hazard, MD;  Location: [facility];  Service: Urology;  Laterality: N/A;  prostate nerve block  . Back surgery    . Foot surgery    . Back surgery      Allergies  Allergen Reactions  . Niacin And Related Itching    Current Outpatient Prescriptions  Medication Sig Dispense Refill  . albuterol (PROVENTIL HFA;VENTOLIN HFA) 108 (90 BASE) MCG/ACT inhaler Inhale 2 puffs into the lungs every 6 (six) hours as needed for wheezing. 1 Inhaler 11  . albuterol (PROVENTIL) (2.5 MG/3ML) 0.083% nebulizer solution Take 3 mLs (2.5 mg total) by nebulization every 6 (six) hours. As directed   DX: COPD emphysema 75 mL 11  . clopidogrel (PLAVIX) 75 MG tablet Take 1 tablet (75 mg total) by mouth daily. 90 tablet 3  . DULoxetine (CYMBALTA) 60 MG capsule Take 60 mg by mouth daily.    Marland Kitchen ezetimibe (ZETIA) 10 MG tablet Take 1 tablet (10 mg total) by mouth every morning. 90 tablet 3  . Fluticasone Furoate-Vilanterol 100-25 MCG/INH AEPB Inhale 100 mcg into the lungs daily. Rinse mouth after each use. 1 each 6  . Ibuprofen-Diphenhydramine Cit ([employer] IBUPROFEN PM) 200-38 MG TABS Take by mouth. As needed    . isosorbide mononitrate (IMDUR) 30 MG 24 hr tablet Take 1 tablet (30 mg total) by mouth daily. 30 tablet 3  . niacin 500 MG CR capsule Take 1 capsule (500 mg total) by mouth at bedtime. (Patient taking  differently: Take 500 mg by mouth daily. ) 90 capsule 3  . nitroGLYCERIN (NITROSTAT) 0.3 MG SL tablet Place 0.3 mg under the tongue every 5 (five) minutes as needed for chest pain.    Marland Kitchen omega-3 acid ethyl esters (LOVAZA) 1 G capsule Take 1 capsule (1 g total) by mouth daily. 90 capsule 3  . ranolazine (RANEXA) 1000 MG SR tablet Take 1 tablet (1,000 mg total) by mouth 2 (two) times daily. 180 tablet 3  . rosuvastatin (CRESTOR) 20 MG tablet Take 1 tablet (20 mg total) by mouth daily. 90 tablet 3  . metoprolol succinate (TOPROL-XL) 50 MG 24 hr tablet Take 1 tablet (50 mg total) by mouth daily. Take with or immediately following a meal. 90 tablet 3   No current facility-administered medications for this visit.    Social history is notable in that he is married to his 2 children ages 25 and 15. He smoked one pack of cigarettes per day until 2000. He does drink occasional alcohol. He does walk  Family History  Problem Relation Age of Onset  . Heart disease Mother   . Heart attack Mother   . Cancer Sister   . Depression Sister    ROS General: Negative; No fevers, chills, or night sweats;  HEENT: Negative; No changes in vision or hearing, sinus congestion, difficulty swallowing Pulmonary: Positive for asbestosis exposure No cough, wheezing, shortness of breath, hemoptysis Cardiovascular: Negative; No chest pain, presyncope, syncope, palpitations GI: Negative; No nausea, vomiting, diarrhea, or abdominal pain GU: Positive for prostate CA No dysuria, hematuria, or difficulty voiding Musculoskeletal: Remote degloving injury; no myalgias, joint pain, or weakness Hematologic/Oncology: Negative; no easy bruising, bleeding Endocrine: Negative; no heat/cold intolerance; no diabetes Neuro: See history of present illness, recent ataxia, probable posterior circulation TIA Skin: Negative; No rashes or skin lesions Psychiatric: Negative; No behavioral problems, depression Sleep: Positive for obstructive  sleep apnea with supplemental 3 L oxygen; he stopped using CPAP, apparently due to difficulties with his mask and potential mask leak; since he stopped using CPAP.  He notes more fatigue, daytime sleepiness,; no bruxism, restless legs, hypnogognic hallucinations, no cataplexy Other comprehensive 14 point system review is negative.   PE BP 124/72 mmHg  Pulse 77  Ht 5' 10"  (  1.778 m)  Wt 204 lb (92.534 kg)  BMI 29.27 kg/m2   Wt Readings from Last 3 Encounters:  04/22/15 204 lb (92.534 kg)  03/26/15 204 lb 6.4 oz (92.715 kg)  03/13/15 203 lb 3.2 oz (92.171 kg)   General: Alert, oriented, no distress.  Skin: normal turgor, no rashes HEENT: Normocephalic, atraumatic. Pupils round and reactive; sclera anicteric; Fundi no hemorrhages or exudates Nose without nasal septal hypertrophy Mouth/Parynx benign; Mallinpatti scale 3 Neck: No JVD, no carotid bruits Lungs: clear to ausculatation and percussion; no wheezing or rales Chest wall: No tenderness to palpation Heart: RRR, s1 s2 normal 1/6 systolic murmur; no diastolic murmur.  No rubs, thrills or heaves Abdomen: Mild diastases recti; soft, nontender; no hepatosplenomehaly, BS+; abdominal aorta nontender and not dilated by palpation. Back: No CVA tenderness. Pulses 2+ Extremities: no clubbinbg cyanosis or edema, Homan's sign negative  Neurologic: grossly nonfocal Psychological: Normal affect and mood; normal cognition  June 2016 ECG (independently read by me): Sinus rhythm with sinus arrhythmia, heart rate averaging 68 bpm.  Intervals normal.  December 2015 ECG (independently read by me): Normal sinus rhythm with mild sinus arrhythmia and PACs with ventricular rate at 76 bpm.  QTc interval 427 ms.  No significant ST segment changes.  August 2015 ECG (independently read by me): Sinus rhythm with sinus arrhythmia.  Nonspecific T changes.  QTc interval 421 ms.  08/30/2013 ECG independently read by me today shows normal sinus rhythm with sinus  arrhythmia with a ventricular rate in the 80s. They're nonspecific ST-T changes.  Prior ECG in October 2014: Sinus rhythm with mild sinus arrhythmia at 60 beats per minute. PR interval 172 ms. QTC interval 384 ms.  LABS: BMP Latest Ref Rng 02/17/2015 03/20/2014 03/19/2014  Glucose 70 - 99 mg/dL 151(H) 152(H) 173(H)  BUN 6 - 23 mg/dL 16 11 12   Creatinine 0.50 - 1.35 mg/dL 1.03 0.90 1.01  Sodium 135 - 145 mEq/L 140 139 138  Potassium 3.5 - 5.3 mEq/L 4.4 4.1 4.2  Chloride 96 - 112 mEq/L 101 99 98  CO2 19 - 32 mEq/L 28 24 28   Calcium 8.4 - 10.5 mg/dL 9.3 8.9 8.9   Hepatic Function Latest Ref Rng 02/17/2015 03/20/2014 08/21/2013  Total Protein 6.0 - 8.3 g/dL 6.3 6.2 6.4  Albumin 3.5 - 5.2 g/dL 4.1 3.5 3.1(L)  AST 0 - 37 U/L 18 19 23   ALT 0 - 53 U/L 16 18 20   Alk Phosphatase 39 - 117 U/L 31(L) 46 35(L)  Total Bilirubin 0.2 - 1.2 mg/dL 0.6 0.3 0.6   CBC Latest Ref Rng 02/17/2015 03/20/2014 03/19/2014  WBC 4.0 - 10.5 K/uL 5.3 8.0 8.4  Hemoglobin 13.0 - 17.0 g/dL 14.1 13.2 13.1  Hematocrit 39.0 - 52.0 % 41.2 38.8(L) 37.5(L)  Platelets 150 - 400 K/uL 137(L) 130(L) PLATELET CLUMPS NOTED ON SMEAR, COUNT APPEARS DECREASED   Lab Results  Component Value Date   MCV 89.8 02/17/2015   MCV 88.2 03/20/2014   MCV 89.1 03/19/2014   Lab Results  Component Value Date   TSH 2.601 02/17/2015   Lab Results  Component Value Date   HGBA1C 6.5* 03/20/2014   Lipid Panel     Component Value Date/Time   CHOL 113 02/17/2015 0859   CHOL 118 08/01/2013 0952   TRIG 75 02/17/2015 0859   TRIG 96 08/01/2013 0952   HDL 36* 02/17/2015 0859   HDL 46 08/01/2013 0952   CHOLHDL 3.1 02/17/2015 0859   VLDL 15 02/17/2015 0859  [place] 62 02/17/2015 0859   LDLCALC 53 08/01/2013 0952    I have reviewed recent blood work done by Dr. Ardeth Perfect at [facility] from 04/14/2015.  Renal function liver function studies were normal.  Glucose was elevated at 164.  Lipid studies revealed a total cholesterol 121,  triglycerides 80, HDL 36, LDL 73.  Hemoglobin A1c was 6.5.  TSH was normal at 2.88.  April lipoprotein B was 71.   RADIOLOGY: No results found.   ASSESSMENT AND PLAN: Daniel Singleton is a 74 year old gentleman with a history of diabetes mellitus, hyperlipidemia, obstructive sleep apnea  with supplemental oxygen, as well as COPD. He has a prior tobacco history but he quit on 01/17/1999. He is status post CABG surgery x4 in January 2006 and in 2013 wall, still living in [place] had abnormal nuclear study and repeat catheterization demonstrated some mild blockages.  I reviewed his most recent nuclear study in detail.  The present study raises the suspicion for ischemia in the RCA territory.  He had a mild hypotensive response to exercise and had mild ST changes.  He denies any chest pressure.  He had an abnormal stress test in 2013 which led to his repeat cardiac catheterization.  Since I last saw him, I now obtain the results of his prior nuclear studies.  He has had consistent inferior defect which oftentimes has been interpreted as diaphragmatic attenuation.  When I last saw him, I further titrated his medical regimen.  He's not having any anginal symptoms on his current dose of Ranexa 1000 g twice a day, Toprol-XL 25 mg, isosorbide 30 mg, in addition to his aspirin, Plavix.  He has hyperlipidemia  on Crestor 20 mg in addition to Zetia 10 mg as well as Levoxyl therapy.  On his most recent nuclear study his defect extent was 3% with a total profusion deficit of 5%.  Since his resting pulse is in the upper 70s to close to 80  I will further titrate his Toprol to 50 mg daily.  I will see him in 4 months for reevaluation or sooner if problems arise.  Time spent: 25 minutes  Troy Sine, MD, [facility] 04/24/2015 8:30 PM

## 2015-05-02 ENCOUNTER — Telehealth: Payer: Self-pay | Admitting: Internal Medicine

## 2015-05-02 MED ORDER — PREDNISONE 20 MG PO TABS: 20.0000 mg | ORAL_TABLET | Freq: Every day | ORAL | Status: DC

## 2015-05-02 MED ORDER — AZITHROMYCIN 250 MG PO TABS: ORAL_TABLET | ORAL | Status: DC

## 2015-05-02 NOTE — Telephone Encounter (Signed)
Called spoke with spouse. They are getting ready to go on a bus trip x 7 days. Pt is having a prod cough (yellow phlem), blowing out yellow phlem, wheezing and having chest tx. They called pt PCP and they gave him azithromycin 500 mg QD x 3 days. This is his 3rd day and not feeling better. Wants Dr. Janee Morn recs. Please advise thanks  Allergies  Allergen Reactions  . Niacin And Related Itching     Current Outpatient Prescriptions on File Prior to Visit  Medication Sig Dispense Refill  . albuterol (PROVENTIL HFA;VENTOLIN HFA) 108 (90 BASE) MCG/ACT inhaler Inhale 2 puffs into the lungs every 6 (six) hours as needed for wheezing. 1 Inhaler 11  . albuterol (PROVENTIL) (2.5 MG/3ML) 0.083% nebulizer solution Take 3 mLs (2.5 mg total) by nebulization every 6 (six) hours. As directed   DX: COPD emphysema 75 mL 11  . clopidogrel (PLAVIX) 75 MG tablet Take 1 tablet (75 mg total) by mouth daily. 90 tablet 3  . DULoxetine (CYMBALTA) 60 MG capsule Take 60 mg by mouth daily.    Marland Kitchen ezetimibe (ZETIA) 10 MG tablet Take 1 tablet (10 mg total) by mouth every morning. 90 tablet 3  . Fluticasone Furoate-Vilanterol 100-25 MCG/INH AEPB Inhale 100 mcg into the lungs daily. Rinse mouth after each use. 1 each 6  . Ibuprofen-Diphenhydramine Cit ([employer] IBUPROFEN PM) 200-38 MG TABS Take by mouth. As needed    . isosorbide mononitrate (IMDUR) 30 MG 24 hr tablet Take 1 tablet (30 mg total) by mouth daily. 30 tablet 3  . metoprolol succinate (TOPROL-XL) 50 MG 24 hr tablet Take 1 tablet (50 mg total) by mouth daily. Take with or immediately following a meal. 90 tablet 3  . niacin 500 MG CR capsule Take 1 capsule (500 mg total) by mouth at bedtime. (Patient taking differently: Take 500 mg by mouth daily. ) 90 capsule 3  . nitroGLYCERIN (NITROSTAT) 0.3 MG SL tablet Place 0.3 mg under the tongue every 5 (five) minutes as needed for chest pain.    Marland Kitchen omega-3 acid ethyl esters (LOVAZA) 1 G capsule Take 1 capsule (1 g total) by mouth  daily. 90 capsule 3  . ranolazine (RANEXA) 1000 MG SR tablet Take 1 tablet (1,000 mg total) by mouth 2 (two) times daily. 180 tablet 3  . rosuvastatin (CRESTOR) 20 MG tablet Take 1 tablet (20 mg total) by mouth daily. 90 tablet 3   No current facility-administered medications on file prior to visit.

## 2015-05-02 NOTE — Telephone Encounter (Signed)
Called pt spouse and is aware of recs. RX sent in. Nothing further needed

## 2015-05-02 NOTE — Telephone Encounter (Signed)
Offer standard Z pak to extend the course, and prednisone 20 mg, # 4, 1 daily  He can take an otc cough medicine like Delsym if needed

## 2015-05-08 ENCOUNTER — Encounter: Payer: Self-pay | Admitting: Cardiovascular Disease

## 2015-05-20 ENCOUNTER — Ambulatory Visit: Payer: Medicare Other | Admitting: Cardiovascular Disease

## 2015-05-20 DIAGNOSIS — Z79899 Other long term (current) drug therapy: Secondary | ICD-10-CM | POA: Diagnosis not present

## 2015-05-20 DIAGNOSIS — H2511 Age-related nuclear cataract, right eye: Secondary | ICD-10-CM | POA: Diagnosis not present

## 2015-05-26 ENCOUNTER — Other Ambulatory Visit: Payer: Self-pay | Admitting: *Deleted

## 2015-05-26 ENCOUNTER — Other Ambulatory Visit: Payer: Self-pay | Admitting: Cardiovascular Disease

## 2015-05-26 MED ORDER — ISOSORBIDE MONONITRATE ER 30 MG PO TB24: 30.0000 mg | ORAL_TABLET | Freq: Every day | ORAL | Status: DC

## 2015-05-26 NOTE — Telephone Encounter (Signed)
Rx(s) sent to pharmacy electronically.  

## 2015-05-27 ENCOUNTER — Ambulatory Visit (INDEPENDENT_AMBULATORY_CARE_PROVIDER_SITE_OTHER): Payer: Medicare Other

## 2015-05-27 DIAGNOSIS — Z23 Encounter for immunization: Secondary | ICD-10-CM

## 2015-06-01 ENCOUNTER — Ambulatory Visit (HOSPITAL_BASED_OUTPATIENT_CLINIC_OR_DEPARTMENT_OTHER): Payer: Medicare Other | Attending: Internal Medicine

## 2015-06-01 VITALS — Ht 70.0 in | Wt 200.0 lb

## 2015-06-01 DIAGNOSIS — R0683 Snoring: Secondary | ICD-10-CM | POA: Diagnosis not present

## 2015-06-01 DIAGNOSIS — I493 Ventricular premature depolarization: Secondary | ICD-10-CM | POA: Insufficient documentation

## 2015-06-01 DIAGNOSIS — G4733 Obstructive sleep apnea (adult) (pediatric): Secondary | ICD-10-CM | POA: Diagnosis not present

## 2015-06-14 ENCOUNTER — Encounter (HOSPITAL_BASED_OUTPATIENT_CLINIC_OR_DEPARTMENT_OTHER): Payer: Medicare Other | Admitting: Internal Medicine

## 2015-06-14 DIAGNOSIS — G4733 Obstructive sleep apnea (adult) (pediatric): Secondary | ICD-10-CM | POA: Diagnosis not present

## 2015-06-14 NOTE — Progress Notes (Signed)
Patient Name: Daniel Singleton, Daniel Singleton Date: 06/01/2015 Gender: Male D.O.B: 08/13/1941 Age (years): 39 Referring Provider: Baird Lyons MD, ABSM Height (inches): 69 Interpreting Physician: Baird Lyons MD, ABSM Weight (lbs): 220 RPSGT: Madelon Lips BMI: 32 MRN: [id number] Neck Size: 17.50 CLINICAL INFORMATION Sleep Study Type: Split Night CPAP Indication for sleep study: OSA Epworth Sleepiness Score: 6  SLEEP STUDY TECHNIQUE As per the AASM Manual for the Scoring of Sleep and Associated Events v2.3 (April 2016) with a hypopnea requiring 4% desaturations. The channels recorded and monitored were frontal, central and occipital EEG, electrooculogram (EOG), submentalis EMG (chin), nasal and oral airflow, thoracic and abdominal wall motion, anterior tibialis EMG, snore microphone, electrocardiogram, and pulse oximetry. Continuous positive airway pressure (CPAP) was initiated when the patient met split night criteria and was titrated according to treat sleep-disordered breathing.  MEDICATIONS Medications taken by the patient : charted for review Medications administered by patient during sleep study : Sleep medicine administered - Unspecified at 09:32:26 PM  RESPIRATORY PARAMETERS Diagnostic Total AHI (/hr): 48.1 RDI (/hr): 53.1 OA Index (/hr): - CA Index (/hr): 0.0 REM AHI (/hr): N/A NREM AHI (/hr): 48.1 Supine AHI (/hr): 48.1 Non-supine AHI (/hr): N/A Min O2 Sat (%): 81.00 Mean O2 (%): 91.32 Time below 88% (min): 20.1   Titration Optimal Pressure (cm): 13 AHI at Optimal Pressure (/hr): 0.0 Min O2 at Optimal Pressure (%): 84.0 Supine % at Optimal (%): 100 Sleep % at Optimal (%): 85    SLEEP ARCHITECTURE The recording time for the entire night was 399.5 minutes. During a baseline period of 164.3 minutes, the patient slept for 142.3 minutes in REM and nonREM, yielding a sleep efficiency of 86.6%. Sleep onset after lights out was 14.0 minutes with a REM latency of N/A minutes. The  patient spent 19.68% of the night in stage N1 sleep, 80.32% in stage N2 sleep, 0.00% in stage N3 and 0.00% in REM. During the titration period of 231.9 minutes, the patient slept for 200.5 minutes in REM and nonREM, yielding a sleep efficiency of 86.4%. Sleep onset after CPAP initiation was 4.0 minutes with a REM latency of 47.5 minutes. The patient spent 18.21% of the night in stage N1 sleep, 46.14% in stage N2 sleep, 2.74% in stage N3 and 32.91% in REM.  CARDIAC DATA The 2 lead EKG demonstrated sinus rhythm. The mean heart rate was 57.47 beats per minute. Other EKG findings include: PVCs.  LEG MOVEMENT DATA The total Periodic Limb Movements of Sleep (PLMS) were 0. The PLMS index was 0.00 .  IMPRESSIONS - Severe obstructive sleep apnea occurred during the diagnostic portion of the study (AHI = 48.1/hour). An optimal PAP pressure was selected for this patient ( 13 cm of water) - No significant central sleep apnea occurred during the diagnostic portion of the study (CAI = 0.0/hour). - Severe oxygen desaturation was noted during the diagnostic portion of the study (Min O2 = 81.00%), corrected by therapeutic CPAP. - The patient snored with Loud snoring volume during the diagnostic portion of the study. - EKG findings include PVCs. - Clinically significant periodic limb movements did not occur during sleep.  DIAGNOSIS - Obstructive Sleep Apnea (327.23 [G47.33 ICD-10])  RECOMMENDATIONS - Trial of CPAP therapy on 13 cm H2O with a Medium size Resmed Full Face Mask AirFit F10 mask and heated humidification. - Avoid alcohol, sedatives and other CNS depressants that may worsen sleep apnea and disrupt normal sleep architecture. - Sleep hygiene should be reviewed to assess factors that may improve sleep quality. -  Weight management and regular exercise should be initiated or continued.  Deneise Lever Diplomate, American Board of Sleep Medicine  ELECTRONICALLY SIGNED ON:  06/14/2015, 11:24 AM [place] PH: [phone]   FX: [phone] [place]

## 2015-06-24 DIAGNOSIS — Z7982 Long term (current) use of aspirin: Secondary | ICD-10-CM | POA: Diagnosis not present

## 2015-06-24 DIAGNOSIS — I10 Essential (primary) hypertension: Secondary | ICD-10-CM | POA: Diagnosis not present

## 2015-06-24 DIAGNOSIS — Z951 Presence of aortocoronary bypass graft: Secondary | ICD-10-CM | POA: Diagnosis not present

## 2015-06-24 DIAGNOSIS — J449 Chronic obstructive pulmonary disease, unspecified: Secondary | ICD-10-CM | POA: Diagnosis not present

## 2015-06-24 DIAGNOSIS — H2512 Age-related nuclear cataract, left eye: Secondary | ICD-10-CM | POA: Diagnosis not present

## 2015-06-24 DIAGNOSIS — G4730 Sleep apnea, unspecified: Secondary | ICD-10-CM | POA: Diagnosis not present

## 2015-06-24 DIAGNOSIS — Z8546 Personal history of malignant neoplasm of prostate: Secondary | ICD-10-CM | POA: Diagnosis not present

## 2015-06-24 DIAGNOSIS — K219 Gastro-esophageal reflux disease without esophagitis: Secondary | ICD-10-CM | POA: Diagnosis not present

## 2015-06-24 DIAGNOSIS — E119 Type 2 diabetes mellitus without complications: Secondary | ICD-10-CM | POA: Diagnosis not present

## 2015-06-24 DIAGNOSIS — Z79899 Other long term (current) drug therapy: Secondary | ICD-10-CM | POA: Diagnosis not present

## 2015-06-24 DIAGNOSIS — Z961 Presence of intraocular lens: Secondary | ICD-10-CM | POA: Diagnosis not present

## 2015-06-24 DIAGNOSIS — Z9841 Cataract extraction status, right eye: Secondary | ICD-10-CM | POA: Diagnosis not present

## 2015-06-24 DIAGNOSIS — Z87891 Personal history of nicotine dependence: Secondary | ICD-10-CM | POA: Diagnosis not present

## 2015-06-25 ENCOUNTER — Ambulatory Visit: Payer: Medicare Other | Admitting: Internal Medicine

## 2015-07-01 ENCOUNTER — Telehealth: Payer: Self-pay | Admitting: Internal Medicine

## 2015-07-01 NOTE — Telephone Encounter (Signed)
Sleep study on 10/9 shows he still has severe obstructive sleep apnea, stopping 48 times/ hour with significant drop in oxygen saturation. CPAP corrected the apnea.  With his cardiac history, I strongly recommend resume CPAP:      Order [facility]- resume CPAP based on new sleep study, auto 5-15, mask of choice, humidifier, supplies.       He has been using O2 and that can be bled in throufh CPAP  Will need f/u with me in 3 months- please ask Katie's help to schedule that.

## 2015-07-01 NOTE — Telephone Encounter (Signed)
Spoke with [name], patient's wife, she said that patient has been using his CPAP machine.  She said that she just needed to know if she needed to blend his oxygen with the CPAP.  She said that she will blend the oxygen in with his CPAP machine.  She said that she knows how to do that and will take care of it.  She has an appointment scheduled to see Dr. Annamaria Boots on 12/6 and wants to keep that appointment so she can discuss switching DME companies from [facility] to someone else.  Do not need to order CPAP at this time per pt's wife.  Nothing further needed. Closing encounter

## 2015-07-01 NOTE — Telephone Encounter (Signed)
Spoke with pt's wife, states that pt and herself have not heard from [facility] about pt's most recent sleep study and is requesting results of this.  Pt was given a new cpap mask the morning after his sleep study, and has been wearing the mask and 3lpm 02 qhs since the test, but pt and wife are unsure if pt still needs that high of a liter flow qhs with him wearing the cpap as well.    CY please advise.  Thanks!

## 2015-07-22 DIAGNOSIS — L739 Follicular disorder, unspecified: Secondary | ICD-10-CM | POA: Diagnosis not present

## 2015-07-22 DIAGNOSIS — Z6830 Body mass index (BMI) 30.0-30.9, adult: Secondary | ICD-10-CM | POA: Diagnosis not present

## 2015-07-29 ENCOUNTER — Ambulatory Visit (INDEPENDENT_AMBULATORY_CARE_PROVIDER_SITE_OTHER): Payer: Medicare Other | Admitting: Internal Medicine

## 2015-07-29 ENCOUNTER — Other Ambulatory Visit: Payer: Self-pay | Admitting: Cardiovascular Disease

## 2015-07-29 ENCOUNTER — Encounter: Payer: Self-pay | Admitting: Internal Medicine

## 2015-07-29 VITALS — BP 106/60 | HR 75 | Ht 70.0 in | Wt 206.0 lb

## 2015-07-29 DIAGNOSIS — Z77090 Contact with and (suspected) exposure to asbestos: Secondary | ICD-10-CM

## 2015-07-29 DIAGNOSIS — I2510 Atherosclerotic heart disease of native coronary artery without angina pectoris: Secondary | ICD-10-CM | POA: Diagnosis not present

## 2015-07-29 DIAGNOSIS — G4733 Obstructive sleep apnea (adult) (pediatric): Secondary | ICD-10-CM | POA: Diagnosis not present

## 2015-07-29 DIAGNOSIS — J449 Chronic obstructive pulmonary disease, unspecified: Secondary | ICD-10-CM | POA: Diagnosis not present

## 2015-07-29 DIAGNOSIS — J432 Centrilobular emphysema: Secondary | ICD-10-CM

## 2015-07-29 NOTE — Patient Instructions (Signed)
Order- [facility]- change CPAP to auto 5-15, mask of choice, humidifier, supplies, AirView    Dx OSA  Order- ONOX on room air with CPAP     Dx COPD mixed type,

## 2015-07-29 NOTE — Progress Notes (Signed)
55 yoM self referred for  COPD with emphysema diagnosed 3419, complicated by CAD/CABG 4V, DM.       Here with wife Smoked one pack per day until quitting in 2000. Retired from [employer] with associated dust. 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 He says this was associated with asbestos exposure from brake dust. Issues are in legal consideration and he notes that no diagnosis has been made. Has had pneumonia twice, and pneumonia vaccine. History of sleep apnea diagnosis but quit CPAP. Wife confirms he snores wearing a chin strap and sleeping in a recliner. Significant injury at work 01/17/1999, broken tibia and "degloving" of R  lower leg.  01/10/13- self referred for  COPD with emphysema diagnosed 3790, OSA, complicated by CAD/CABG 4V, DM, hx major trauma leg.       Here with wife Mowing the yard with mask in dusty but will still cough. Wife says if he lies down a recliner he holds his breath saturation will drop to 86%.he says he has been wearing his CPAP again/Advanced, but she describes apneas when he is sleeping without it.  PFT 01/01/2013: Minimal obstructive airways disease, air trapping with increased residual volume, mild response to bronchodilator, diffusion slightly reduced. FVC 4.11/96%, FEV1 2.59/82%, FEV1/FVC 0.63/85%, FEF 25-75% 1.63/68%. TLC 100%, DLCO 76%. CXR 11/23/12 IMPRESSION:  Underlying emphysema. No edema or consolidation.  Original Report Authenticated By: Lowella Grip, M.D.  03/05/13- self referred for  COPD with emphysema diagnosed 2409, OSA, complicated by CAD/CABG 4V, DM, hx major trauma leg.       Here with wife FOLLOWS FOR: pt reports breathing is doing well-- states concentrator is making a "racket and is on its last leg"-- denies any other concerns at this time. CPAP Autopap/ Advanced Mask hurts bridge of nose if he stretches it on too tightly. Oxygen concentrator is getting loud. Compliance okay but inadequate control  based on download. Needs broader pressure range for autotitration.  09/04/13- 67 yoM former smoker self referred for  COPD with emphysema, hx asbestos exposure, OSA, complicated by CAD/CABG 4V, DM, hx major trauma leg.       Here with wife CPAP AutoPAP Advanced- not using-blows too hard. Does wearing oxygen 3 L/Advanced for sleep. Wife says she rarely notices apneas now when he is not wearing CPAP. FOLLOWS FOR: Pt states breathing is doing well since last visit; recent hospital for intestine blockage. Pending XRT for prostate cancer  11/20/13- 72 yoM former smoker self referred for  COPD with emphysema, hx asbestos exposure, OSA, complicated by CAD/CABG 4V, DM, hx major trauma leg, prostate Ca.       Here with wife FOLLOWS FOR: wears CPAPAuto or 10?/ Advanced every night for about 6 hours; Pt states [facility] did ONO since visit off CPAP- seeking report Pressure working well for patient; DME is [facility], Still using oxygen at 3 L. No recent nitroglycerin. Not using inhalers. Recent hospital for small bowel obstruction.   05/13/14- 86 yoM former smoker self referred for  COPD with emphysema, hx asbestos exposure, OSA, complicated by CAD/CABG 4V, DM, hx major trauma leg, prostate Ca.       Here with wife follows for: Pt states he was off CPAP auto/+ O2 3L Advanced for several months while getting XRT for prostate cancer, but started back x 2 weeks ago. He is wearing it every night for about 8 hours a night.  Sleeps in a recliner. COPD: Pt denies any increase in SOB,  cough at this time.  CXR 03/19/14 IMPRESSION:  No acute abnormalities.  Electronically Signed  By: Rozetta Nunnery M.D.  On: 03/19/2014 20:24  11/11/14-73 yoM former smoker self referred for  COPD with emphysema, hx asbestos exposure, OSA, complicated by CAD/CABG 4V, DM, hx major trauma leg, prostate Ca.       Here with wife FOLLOWS FOR: Has not worn CPAP in a while; pt states Memory Dance is helping with breathing,  03/13/15- 38 yoM former smoker self referred  for  COPD with emphysema, hx asbestos exposure, OSA/ failed CPAP, complicated by CAD/CABG 4V, DM, hx major trauma leg, prostate Ca.       Here with wife FOLLOWS FOR: Pt states he is wearing 3 lpm/  nightly. Not wearing CPAP. Pt denies any current complaints.  FOLLOWS FOR: Pt not taking Breo daily, pt taking it as needed. Pt states he has noticed he is more SOB with the humid weather. Pt denies cough and CP/tightness. Pt had a recent stress test on 7.8.16 and is scheduled to see Dr. Claiborne Billings 8.3.16 He dislikes CPAP and stopped using it. Sleeps sitting up in a recliner and wearing oxygen. His cardiologist, Dr. Claiborne Billings, was concerned about his sleep apnea status and recommended a repeat sleep study. With discussion here and pressure from his wife, the patient reluctantly agrees.  07/29/2015-74 year old male former smoker followed for COPD/emphysema, asbestos exposure, OSA, complicated by CAD/CABG 4V, DM, history major trauma leg, prostate CA   Wife here NPSG 06/01/15- AHI 48.1/ hr, desat to 81%, CPAP to 13, body weight 220 pounds Since the sleep study we had intended change to auto 5-15 but that did not get done. CPAP is still on 10 and he is bleeding in home oxygen although oxygenation was corrected by CPAP during his sleep study. Reviewed sleep study results and discuss treatment implications. Has been using oxygen but not clear he will need it if CPAP control successful. He agrees to restart CPAP using AutoPap this time  ROS-see HPI Constitutional:   No-   weight loss, night sweats, fevers, chills, +fatigue, lassitude. HEENT:   No-  headaches, difficulty swallowing, tooth/dental problems, sore throat,       No-  sneezing, itching, ear ache, nasal congestion, post nasal drip,  CV:  No-   chest pain, orthopnea, PND, swelling in lower extremities, anasarca, dizziness, palpitations Resp: + shortness of breath with exertion or at rest.              No-   productive cough,  + non-productive cough,  No-  coughing up of blood.              No-   change in color of mucus.  No- wheezing.   Skin: No-   rash or lesions. GI:  No-   heartburn, indigestion, abdominal pain, nausea, vomiting, GU:  MS:  No-   joint pain or swelling.  . Neuro-     nothing unusual Psych:  No- change in mood or affect. + depression or anxiety.  No memory loss.  OBJ- Physical Exam General- Alert, Oriented, Affect-appropriate, Distress- none acute. overweight Skin- rash-none, lesions- none, excoriation- none Lymphadenopathy- none Head- atraumatic            Eyes- Gross vision intact, PERRLA, conjunctivae and secretions clear            Ears- Hearing, canals-normal            Nose-  Clear, no-Septal dev, mucus, polyps, erosion, perforation  Throat- Mallampati II , mucosa clear , drainage- none, tonsils- atrophic Neck- flexible , trachea midline, no stridor , thyroid nl, carotid no bruit Chest - symmetrical excursion , unlabored           Heart/CV- RRR , no murmur , no gallop  , no rub, nl s1 s2                           - JVD- none , edema- none, stasis changes- none, varices- none           Lung- +diminished but clear to P&A, wheeze- none, cough + dry , dullness-none, rub- none           Chest wall-  Abd-  Br/ Gen/ Rectal- Not done, not indicated Extrem- cyanosis- none, clubbing, none, atrophy- none, strength- nl. -+cane. Support stockings Neuro- grossly intact to observation

## 2015-07-29 NOTE — Assessment & Plan Note (Signed)
No recent acute exacerbation. Plan-overnight oximetry on room air while wearing CPAP to make sure CPAP corrects his oxygen desaturation

## 2015-07-29 NOTE — Assessment & Plan Note (Signed)
Continuing occasional chest x-ray for surveillance

## 2015-07-29 NOTE — Telephone Encounter (Signed)
Rx request sent to pharmacy.  

## 2015-07-29 NOTE — Assessment & Plan Note (Signed)
He had disliked CPAP previously and was sleeping in a recliner chair with supplemental oxygen. He agrees to try CPAP again, this time using AutoPap Plan-AutoPap 5-15

## 2015-08-05 DIAGNOSIS — C61 Malignant neoplasm of prostate: Secondary | ICD-10-CM | POA: Diagnosis not present

## 2015-08-05 DIAGNOSIS — N5201 Erectile dysfunction due to arterial insufficiency: Secondary | ICD-10-CM | POA: Diagnosis not present

## 2015-08-05 DIAGNOSIS — N3943 Post-void dribbling: Secondary | ICD-10-CM | POA: Diagnosis not present

## 2015-08-05 DIAGNOSIS — R351 Nocturia: Secondary | ICD-10-CM | POA: Diagnosis not present

## 2015-08-13 ENCOUNTER — Encounter: Payer: Self-pay | Admitting: Cardiovascular Disease

## 2015-08-13 ENCOUNTER — Ambulatory Visit (INDEPENDENT_AMBULATORY_CARE_PROVIDER_SITE_OTHER): Payer: Medicare Other | Admitting: Cardiovascular Disease

## 2015-08-13 VITALS — BP 128/76 | HR 71 | Ht 70.0 in | Wt 202.0 lb

## 2015-08-13 DIAGNOSIS — G4733 Obstructive sleep apnea (adult) (pediatric): Secondary | ICD-10-CM | POA: Diagnosis not present

## 2015-08-13 DIAGNOSIS — I25810 Atherosclerosis of coronary artery bypass graft(s) without angina pectoris: Secondary | ICD-10-CM

## 2015-08-13 DIAGNOSIS — I2510 Atherosclerotic heart disease of native coronary artery without angina pectoris: Secondary | ICD-10-CM | POA: Diagnosis not present

## 2015-08-13 DIAGNOSIS — I2583 Coronary atherosclerosis due to lipid rich plaque: Secondary | ICD-10-CM

## 2015-08-13 DIAGNOSIS — E785 Hyperlipidemia, unspecified: Secondary | ICD-10-CM | POA: Diagnosis not present

## 2015-08-13 DIAGNOSIS — R9439 Abnormal result of other cardiovascular function study: Secondary | ICD-10-CM

## 2015-08-13 DIAGNOSIS — R931 Abnormal findings on diagnostic imaging of heart and coronary circulation: Secondary | ICD-10-CM

## 2015-08-13 NOTE — Patient Instructions (Signed)
Your physician wants you to follow-up in: 1 year or sooner if needed. You will receive a reminder letter in the mail two months in advance. If you don't receive a letter, please call our office to schedule the follow-up appointment.   If you need a refill on your cardiac medications before your next appointment, please call your pharmacy.   

## 2015-08-15 ENCOUNTER — Encounter: Payer: Self-pay | Admitting: Cardiovascular Disease

## 2015-08-15 NOTE — Progress Notes (Signed)
Patient ID: Daniel Singleton, male   DOB: 1941-08-04, 74 y.o.   MRN: [id number]    Primary M.D.: Dr. Jose Persia  HPI:  Daniel Singleton is a 74 year old gentleman who presents for a 4 month follow-up cardiology evaluation.  [name] has a history of hypertension, hyperlipidemia, COPD, prior asbestos exposure, diabetes mellitus , and  prostate cancer. In January 2006, he underwent CABG surgery x4  in [facility] by Dr. Zorita Pang. I do not have the specifics of his bypass grafts. He had stress test in [place] in 2013 after he had experienced some recurrent chest pain episodes and a cardiac catheterization which showed mild blockages.  He also has a history of obstructive sleep apnea on CPAP therapy with 3 L of oxygen and is followed by Dr. Keturah Barre.  Daniel Singleton admits to mild shortness of breath.  He has a history of hyperlipidemia.   In the past he had been on isosorbide mononitrate therapy but stopped taking this because of libido concerns and potential need for medications for erectile function.  An echo Doppler study on 04/03/2013 showed an ejection fraction of 55-60%; mild tricuspid regurgitation, and grade 1 diastolic dysfunction. When I saw him, I recommended that we perform advanced lipid testing with NMR lipoprotein of since he has had low HDL levels and her recent cholesterol was 185 triglycerides 153 HDL 31 and LDL 123. At that time, I also started him on Ranexa 500 mg twice a day since he had discontinued his isosorbide. I added Zetia 10 mg to his atorvastatin and recommended that he undergo a followup with NMR lipoprotein assessment.  His NMR study was markedly abnormal. Specifically, his total cholesterol was 187 triglycerides 348 HDL 32 LDL cholesterol 85. However, he had markedly increased goal LDL particles at 1739 status LDL particle number was markedly increased at 2463. The patient tells me he did develop some myalgias on Lipitor and therefore for approximately the  last 3-4 weeks completely discontinued a torus that in therapy.  He was  hospitalized on December 26 through 08/21/2013 with a partial small bowel obstruction. He had experienced nausea and vomiting with diarrhea for 4 days prior to his admission and was found to have a partial mid-to high grade small bowel obstruction with a transition point in the right lower quadrant. He was cleared for surgery and if surgery was to be done he was to stop his Plavix and continue aspirin. Fortunately, his bowel obstruction resolved with medical management and surgical intervention was not necessary.  I'd seen him in January 2015 at which time he was off Plavix therapy and was not having any anginal symptoms on his current medical regimen.  He was hospitalized on 03/19/2014 through 03/21/2014 with ataxia.  He had complaints of vertigo with nausea, vomiting, double vision, numbness, and weakness.  A CT of the brain did not show acute intracranial abnormalities, but did show chronic atrophy and small vessel ischemic changes.  A 2-D echo Doppler study showed an EF of 45-50% without cardiac source of emboli.  Carotid studies revealed mild bilateral plaque with narrowing of 1-39% range.  Antegrade flow is vertebral artery.  Doppler of the brachial artery demonstrate triphasic waveforms.  He was felt possibly to have a posterior circulation TIA.  His aspirin 81 mg was increased to 325 mg.  and I last saw him in August 2015 due to his recent TIA.  I recommended reinitiation of Plavix 75 mg daily and reduced his aspirin dose back  to 81 mg.  When I saw several months ago he denied any significant chest pain but had experienced one short episode in February that was nonexertional.  However, he had noticed increasing exertional shortness of breath with activity.  He had stopped using his CPAP therapy and complained of increased fatigue.  He had not seen Dr. Annamaria Boots in some time and I recommended that when he saw him in July that  consideration for follow-up sleep study.  He at home.  At that time, he was planning to undergo cataract surgery sometime this fall.  Due to his exertional shortness of breath and his established coronary artery disease.  I recommended a nuclear perfusion study.  This was done on 02/28/2015 and was intermediate risk.  He developed mild ST segment depression and had occasional PACs and PVCs.  Scintigraphic images revealed a medium defect of moderate severity in the RCA distribution.  He also had an abnormal blood pressure response with hypotension during stress.  When I last saw him he was remaining completely asymptomatic.  Since that time, I was able to obtain the records from [place] to see if he had any significant inferior defect on prior nuclear studies.  Review his records dating back to 2006 including catheterization studies and subsequent nuclear imaging studies.  On the majority of his nuclear studies dating from 2007.  He was found to have mild fixed basal posterior lateral defect and there was some concern of possible attenuation artifact.  He was not found to have significant ischemia.  A nuclear study in 2011 was interpreted as indeterminate for ischemia with motion artifact.  His last study in [place] was in 2013 which again showed a fixed inferior defect and was felt that he had a low probability of ischemia.  Daniel Singleton states that over the past 6 months.  He is taken one sublingual nitroglycerin for chest pain.  He denies recent exertional chest pain symptomatology.  He denies palpitations.  He denies PND, orthopnea.  He presents for evaluation.  Past Medical History  Diagnosis Date  . Hypertension   . Emphysema   . Diabetes ([place])   . Hypercholesteremia   . PONV (postoperative nausea and vomiting)   . Angina at rest [facility])   . Coronary artery disease     recently started seeing Dr. Claiborne Billings   . Arthritis   . GERD (gastroesophageal reflux disease)   . Elevated PSA   . Sleep apnea      c pap with oxygen at 2.5 L  . Prostate cancer ([place]) 05/23/13    gleason 3+4=7, volume 31 cc  . Asthma   . Depression   . Anxiety   . Hx of radiation therapy 09/20/13- 11/14/13    prostate 7800 cGy 40 sessions, seminal vesicles 5600 cGy in 40 sessions    Past Surgical History  Procedure Laterality Date  . Heart bypass  2006    Quad  . Degloving rt leg injury  2000    had skin grafts  . Hernia repair  2008    x 3 w/mesh, bilat inguinla, umbilical  . Back surgery  2006    discectomy lumbar  . Tonsillectomy      w/adenoidectomy  . Prostate biopsy N/A 05/23/2013    Procedure: BIOPSY TRANSRECTAL ULTRASONIC PROSTATE (TUBP);  Surgeon: Molli Hazard, MD;  Location: [facility];  Service: Urology;  Laterality: N/A;  prostate nerve block  . Back surgery    . Foot surgery    . Back  surgery      Allergies  Allergen Reactions  . Niacin And Related Itching    Current Outpatient Prescriptions  Medication Sig Dispense Refill  . albuterol (PROVENTIL HFA;VENTOLIN HFA) 108 (90 BASE) MCG/ACT inhaler Inhale 2 puffs into the lungs every 6 (six) hours as needed for wheezing. 1 Inhaler 11  . albuterol (PROVENTIL) (2.5 MG/3ML) 0.083% nebulizer solution Take 3 mLs (2.5 mg total) by nebulization every 6 (six) hours. As directed   DX: COPD emphysema 75 mL 11  . clopidogrel (PLAVIX) 75 MG tablet Take 1 tablet (75 mg total) by mouth daily. 90 tablet 3  . DULoxetine (CYMBALTA) 60 MG capsule Take 60 mg by mouth daily.    Marland Kitchen ezetimibe (ZETIA) 10 MG tablet Take 1 tablet (10 mg total) by mouth every morning. 90 tablet 3  . Fluticasone Furoate-Vilanterol 100-25 MCG/INH AEPB Inhale 100 mcg into the lungs daily. Rinse mouth after each use. (Patient taking differently: Inhale 100 mcg into the lungs as needed. Rinse mouth after each use.) 1 each 6  . Ibuprofen-Diphenhydramine Cit ([employer] IBUPROFEN PM) 200-38 MG TABS Take by mouth. As needed    . isosorbide mononitrate (IMDUR) 30 MG 24 hr tablet Take 1 tablet by  mouth  daily 90 tablet 0  . metoprolol succinate (TOPROL-XL) 50 MG 24 hr tablet Take 1 tablet (50 mg total) by mouth daily. Take with or immediately following a meal. 90 tablet 3  . niacin 500 MG tablet Take 500 mg by mouth daily. OTC    . nitroGLYCERIN (NITROSTAT) 0.3 MG SL tablet Place 0.3 mg under the tongue every 5 (five) minutes as needed for chest pain.    Marland Kitchen omega-3 acid ethyl esters (LOVAZA) 1 G capsule Take 1 capsule (1 g total) by mouth daily. 90 capsule 3  . ranolazine (RANEXA) 1000 MG SR tablet Take 1 tablet (1,000 mg total) by mouth 2 (two) times daily. 180 tablet 3  . rosuvastatin (CRESTOR) 20 MG tablet Take 1 tablet (20 mg total) by mouth daily. 90 tablet 3   No current facility-administered medications for this visit.    Social history is notable in that he is married to his 2 children ages 60 and 40. He smoked one pack of cigarettes per day until 2000. He does drink occasional alcohol. He does walk  Family History  Problem Relation Age of Onset  . Heart disease Mother   . Heart attack Mother   . Cancer Sister   . Depression Sister    ROS General: Negative; No fevers, chills, or night sweats;  HEENT: Negative; No changes in vision or hearing, sinus congestion, difficulty swallowing Pulmonary: Positive for asbestosis exposure No cough, wheezing, shortness of breath, hemoptysis Cardiovascular: Negative; No chest pain, presyncope, syncope, palpitations GI: Negative; No nausea, vomiting, diarrhea, or abdominal pain GU: Positive for prostate CA No dysuria, hematuria, or difficulty voiding Musculoskeletal: Remote degloving injury; no myalgias, joint pain, or weakness Hematologic/Oncology: Negative; no easy bruising, bleeding Endocrine: Negative; no heat/cold intolerance; no diabetes Neuro: See history of present illness, recent ataxia, probable posterior circulation TIA Skin: Negative; No rashes or skin lesions Psychiatric: Negative; No behavioral problems, depression Sleep:  Positive for obstructive sleep apnea with supplemental 3 L oxygen; he stopped using CPAP, apparently due to difficulties with his mask and potential mask leak; since he stopped using CPAP.  He notes more fatigue, daytime sleepiness,; no bruxism, restless legs, hypnogognic hallucinations, no cataplexy Other comprehensive 14 point system review is negative.   PE BP 128/76 mmHg  Pulse 71  Ht 5' 10"  (1.778 m)  Wt 202 lb (91.627 kg)  BMI 28.98 kg/m2   Wt Readings from Last 3 Encounters:  08/13/15 202 lb (91.627 kg)  07/29/15 206 lb (93.441 kg)  06/01/15 200 lb (90.719 kg)   General: Alert, oriented, no distress.  Skin: normal turgor, no rashes HEENT: Normocephalic, atraumatic. Pupils round and reactive; sclera anicteric; Fundi no hemorrhages or exudates Nose without nasal septal hypertrophy Mouth/Parynx benign; Mallinpatti scale 3 Neck: No JVD, no carotid bruits Lungs: clear to ausculatation and percussion; no wheezing or rales Chest wall: No tenderness to palpation Heart: RRR, s1 s2 normal 1/6 systolic murmur; no diastolic murmur.  No rubs, thrills or heaves Abdomen: Mild diastases recti; soft, nontender; no hepatosplenomehaly, BS+; abdominal aorta nontender and not dilated by palpation. Back: No CVA tenderness. Pulses 2+ Extremities: no clubbinbg cyanosis or edema, Homan's sign negative  Neurologic: grossly nonfocal Psychological: Normal affect and mood; normal cognition  ECG (independently read by me): Normal sinus rhythm at 71 bpm with  PACs.  QTc interval normal at 439.  June 2016 ECG (independently read by me): Sinus rhythm with sinus arrhythmia, heart rate averaging 68 bpm.  Intervals normal.  December 2015 ECG (independently read by me): Normal sinus rhythm with mild sinus arrhythmia and PACs with ventricular rate at 76 bpm.  QTc interval 427 ms.  No significant ST segment changes.  August 2015 ECG (independently read by me): Sinus rhythm with sinus arrhythmia.  Nonspecific T  changes.  QTc interval 421 ms.  08/30/2013 ECG independently read by me today shows normal sinus rhythm with sinus arrhythmia with a ventricular rate in the 80s. They're nonspecific ST-T changes.  Prior ECG in October 2014: Sinus rhythm with mild sinus arrhythmia at 60 beats per minute. PR interval 172 ms. QTC interval 384 ms.  LABS: BMP Latest Ref Rng 02/17/2015 03/20/2014 03/19/2014  Glucose 70 - 99 mg/dL 151(H) 152(H) 173(H)  BUN 6 - 23 mg/dL 16 11 12   Creatinine 0.50 - 1.35 mg/dL 1.03 0.90 1.01  Sodium 135 - 145 mEq/L 140 139 138  Potassium 3.5 - 5.3 mEq/L 4.4 4.1 4.2  Chloride 96 - 112 mEq/L 101 99 98  CO2 19 - 32 mEq/L 28 24 28   Calcium 8.4 - 10.5 mg/dL 9.3 8.9 8.9   Hepatic Function Latest Ref Rng 02/17/2015 03/20/2014 08/21/2013  Total Protein 6.0 - 8.3 g/dL 6.3 6.2 6.4  Albumin 3.5 - 5.2 g/dL 4.1 3.5 3.1(L)  AST 0 - 37 U/L 18 19 23   ALT 0 - 53 U/L 16 18 20   Alk Phosphatase 39 - 117 U/L 31(L) 46 35(L)  Total Bilirubin 0.2 - 1.2 mg/dL 0.6 0.3 0.6   CBC Latest Ref Rng 02/17/2015 03/20/2014 03/19/2014  WBC 4.0 - 10.5 K/uL 5.3 8.0 8.4  Hemoglobin 13.0 - 17.0 g/dL 14.1 13.2 13.1  Hematocrit 39.0 - 52.0 % 41.2 38.8(L) 37.5(L)  Platelets 150 - 400 K/uL 137(L) 130(L) PLATELET CLUMPS NOTED ON SMEAR, COUNT APPEARS DECREASED   Lab Results  Component Value Date   MCV 89.8 02/17/2015   MCV 88.2 03/20/2014   MCV 89.1 03/19/2014   Lab Results  Component Value Date   TSH 2.601 02/17/2015   Lab Results  Component Value Date   HGBA1C 6.5* 03/20/2014   Lipid Panel     Component Value Date/Time   CHOL 113 02/17/2015 0859   CHOL 118 08/01/2013 0952   TRIG 75 02/17/2015 0859   TRIG 96 08/01/2013 0952   HDL  36* 02/17/2015 0859   HDL 46 08/01/2013 0952   CHOLHDL 3.1 02/17/2015 0859   VLDL 15 02/17/2015 0859   LDLCALC 62 02/17/2015 0859   LDLCALC 53 08/01/2013 0952    I have reviewed recent blood work done by Dr. Ardeth Perfect at [facility] from 04/14/2015.  Renal function liver  function studies were normal.  Glucose was elevated at 164.  Lipid studies revealed a total cholesterol 121, triglycerides 80, HDL 36, LDL 73.  Hemoglobin A1c was 6.5.  TSH was normal at 2.88.  April lipoprotein B was 71.   RADIOLOGY: No results found.   ASSESSMENT AND PLAN: Mr. Aniceto Kyser is a 74 year old gentleman with a history of diabetes mellitus, hyperlipidemia, obstructive sleep apnea  with supplemental oxygen, as well as COPD. He has a prior tobacco history but he quit on 01/17/1999. He is status post CABG surgery x4 in January 2006 and in 2013 in [place] had abnormal nuclear study.  Repeat catheterization demonstrated some mild blockages.  His most recent nuclear study  raises the suspicion for ischemia in the RCA territory.  He had a mild hypotensive response to exercise and had mild ST changes.  He denies any chest pressure.  He had an abnormal stress test in 2013 which led to his repeat cardiac catheterization.  On prior nuclear studies in [place] he has had consistent inferior defect which oftentimes has been interpreted as diaphragmatic attenuation.  Over the past 6 months, he has experienced only one short episode of chest pain  on his current dose of Ranexa 1000 g twice a day, Toprol-XL 50 mg, isosorbide 30 mg, in addition to his aspirin, Plavix.  He has hyperlipidemia  on Crestor 20 mg in addition to Zetia 10 mg as well as Levoxyl therapy.  I did increase his beta blocker therapy at his last office visit.  Presently, I recommended he continue his current therapy as prescribed.  His ECG remained stable and his resting pulse is improved on 50 mg Toprol daily.  He continues to be on dual antiplatelet therapy denies bleeding.  He has obstructive sleep apnea and is on CPAP therapy followed by Dr. Annamaria Boots.  As long as she remains stable I will see him in one year for reevaluation.  Time spent: 25 minutes  Troy Sine, MD, [facility] 08/15/2015 1:17 PM

## 2015-09-02 ENCOUNTER — Telehealth: Payer: Self-pay | Admitting: Internal Medicine

## 2015-09-02 DIAGNOSIS — G4733 Obstructive sleep apnea (adult) (pediatric): Secondary | ICD-10-CM

## 2015-09-02 DIAGNOSIS — J449 Chronic obstructive pulmonary disease, unspecified: Secondary | ICD-10-CM

## 2015-09-02 NOTE — Telephone Encounter (Signed)
Dola Argyle, patient's wife returning call, CB [phone]

## 2015-09-02 NOTE — Telephone Encounter (Signed)
lmtcb X1 for pt  

## 2015-09-02 NOTE — Telephone Encounter (Signed)
Called spoke with patient's spouse Dola Argyle who is requesting the results on pt's ONO on CPAP done 12.29.16 Advised Suzie will call [facility] to get ONO results faxed Suzie is aware that CY is not in the office this afternoon  LMOM TCB x1 for dream team at [facility] to fax results Will hold in triage to ensure results are recieved

## 2015-09-03 NOTE — Telephone Encounter (Signed)
Results have still not been received. lmtcb x2 for [facility].

## 2015-09-04 ENCOUNTER — Other Ambulatory Visit: Payer: Self-pay | Admitting: Cardiovascular Disease

## 2015-09-04 MED ORDER — ISOSORBIDE MONONITRATE ER 30 MG PO TB24: 30.0000 mg | ORAL_TABLET | Freq: Every day | ORAL | Status: DC

## 2015-09-04 NOTE — Telephone Encounter (Signed)
Daniel Singleton has requested this message be routed back to her.  Will forward to Katie to follow up on at her request.

## 2015-09-04 NOTE — Telephone Encounter (Signed)
Rx request sent to pharmacy.  

## 2015-09-05 NOTE — Telephone Encounter (Signed)
Spoke with Joellen Jersey about this message, forwarding again to her at her request to handle this message today.

## 2015-09-05 NOTE — Telephone Encounter (Signed)
Pt wife returning call to speak to [facility] she can be reached @  [phone].[name]

## 2015-09-05 NOTE — Telephone Encounter (Signed)
Patient's wife is calling again, CB [phone].

## 2015-09-05 NOTE — Telephone Encounter (Signed)
Wife is aware that patient does not need O2; only CPAP. Daniel Singleton states that pt continues to have issues with his CPAP mask-makes blisters on his nose. No adjustments to CPAP were made. Pt wore CPAP on Friday night last week and pt's nose was a solid, large blister-therefore pt has not used CPAP since then. Daniel Singleton is aware that I have sent [name] with [facility] a staff message and spoken with CY about this matter. Will hold message in my basket until answer from [place] at [facility] next week; if needed we can send to daytime sleep lab to help with mask fit,etc.

## 2015-09-09 NOTE — Telephone Encounter (Signed)
Katie please advise when you've heard back from [place].  Thanks.

## 2015-09-10 NOTE — Telephone Encounter (Signed)
[name] Stenson  Lorane Gell, CMA           I see that he just saw one of our really good RT techs, Lytle Michaels, at our [employer] [facility] on 1/16. He changed her pressure, adjusted her mask - no leaks now. The pt advised that she was getting sores on the bridge of her nose so Lytle Michaels explained that the best way to remedy that would be to wear a pillow mask but the pt declined    Called patient and spoke with him about his mask fit; he requests his wife contact me tomorrow.

## 2015-09-12 ENCOUNTER — Other Ambulatory Visit: Payer: Self-pay | Admitting: Cardiovascular Disease

## 2015-09-12 NOTE — Telephone Encounter (Signed)
lmtcb X1 for pt  

## 2015-09-17 ENCOUNTER — Encounter: Payer: Self-pay | Admitting: Internal Medicine

## 2015-09-26 ENCOUNTER — Encounter: Payer: Self-pay | Admitting: Internal Medicine

## 2015-09-30 DIAGNOSIS — L0291 Cutaneous abscess, unspecified: Secondary | ICD-10-CM | POA: Diagnosis not present

## 2015-10-10 ENCOUNTER — Telehealth: Payer: Self-pay | Admitting: Internal Medicine

## 2015-10-10 MED ORDER — PREDNISONE 10 MG PO TABS: ORAL_TABLET | ORAL | Status: DC

## 2015-10-10 NOTE — Telephone Encounter (Signed)
Spoke with spouse. She reports pt has dry cough, chest tightness, increase SOB w/ exertion x yesterday. Wants something called in.  Denies any f/c/s/n/v. Please advise Dr. Annamaria Boots thanks  Allergies  Allergen Reactions  . Niacin And Related Itching     Current Outpatient Prescriptions on File Prior to Visit  Medication Sig Dispense Refill  . albuterol (PROVENTIL HFA;VENTOLIN HFA) 108 (90 BASE) MCG/ACT inhaler Inhale 2 puffs into the lungs every 6 (six) hours as needed for wheezing. 1 Inhaler 11  . albuterol (PROVENTIL) (2.5 MG/3ML) 0.083% nebulizer solution Take 3 mLs (2.5 mg total) by nebulization every 6 (six) hours. As directed   DX: COPD emphysema 75 mL 11  . clopidogrel (PLAVIX) 75 MG tablet Take 1 tablet (75 mg total) by mouth daily. 90 tablet 3  . DULoxetine (CYMBALTA) 60 MG capsule Take 60 mg by mouth daily.    . Fluticasone Furoate-Vilanterol 100-25 MCG/INH AEPB Inhale 100 mcg into the lungs daily. Rinse mouth after each use. (Patient taking differently: Inhale 100 mcg into the lungs as needed. Rinse mouth after each use.) 1 each 6  . Ibuprofen-Diphenhydramine Cit ([employer] IBUPROFEN PM) 200-38 MG TABS Take by mouth. As needed    . isosorbide mononitrate (IMDUR) 30 MG 24 hr tablet Take 1 tablet (30 mg total) by mouth daily. 90 tablet 3  . metoprolol succinate (TOPROL-XL) 50 MG 24 hr tablet Take 1 tablet (50 mg total) by mouth daily. Take with or immediately following a meal. 90 tablet 3  . niacin 500 MG tablet Take 500 mg by mouth daily. OTC    . nitroGLYCERIN (NITROSTAT) 0.3 MG SL tablet Place 0.3 mg under the tongue every 5 (five) minutes as needed for chest pain.    Marland Kitchen omega-3 acid ethyl esters (LOVAZA) 1 g capsule Take 1 capsule by mouth  daily 90 capsule 3  . ranolazine (RANEXA) 1000 MG SR tablet Take 1 tablet (1,000 mg total) by mouth 2 (two) times daily. 180 tablet 3  . rosuvastatin (CRESTOR) 20 MG tablet Take 1 tablet by mouth  daily 90 tablet 3  . ZETIA 10 MG tablet Take 1 tablet  by mouth  every morning 90 tablet 3   No current facility-administered medications on file prior to visit.

## 2015-10-10 NOTE — Telephone Encounter (Signed)
Spoke with spouse and is aware of recs. RX has been sent in. Nothing further needed

## 2015-10-10 NOTE — Telephone Encounter (Signed)
For the cough and chest tightness- continue home meds. Offer prednisone 10 mg, # 10, 2 daily x 3 days, then one daily. Ok to use otc Delsym or Mucinex-DM

## 2015-10-13 ENCOUNTER — Telehealth: Payer: Self-pay | Admitting: Internal Medicine

## 2015-10-13 NOTE — Telephone Encounter (Signed)
Spoke with spouse. Pt finished the prednisone called in for him on 2/17 and also finished the remaining doxy tabs that was prescribed for spouse [name] since she was unable to take this. Pt has a dry cough, nasal cong, PND, wheezing, chest tx. Pt has also had fever of 102 for the past 3-4 days but none since last night.  Please advise Dr. Annamaria Boots thanks Allergies  Allergen Reactions  . Niacin And Related Itching     Current Outpatient Prescriptions on File Prior to Visit  Medication Sig Dispense Refill  . albuterol (PROVENTIL HFA;VENTOLIN HFA) 108 (90 BASE) MCG/ACT inhaler Inhale 2 puffs into the lungs every 6 (six) hours as needed for wheezing. 1 Inhaler 11  . albuterol (PROVENTIL) (2.5 MG/3ML) 0.083% nebulizer solution Take 3 mLs (2.5 mg total) by nebulization every 6 (six) hours. As directed   DX: COPD emphysema 75 mL 11  . clopidogrel (PLAVIX) 75 MG tablet Take 1 tablet (75 mg total) by mouth daily. 90 tablet 3  . DULoxetine (CYMBALTA) 60 MG capsule Take 60 mg by mouth daily.    . Fluticasone Furoate-Vilanterol 100-25 MCG/INH AEPB Inhale 100 mcg into the lungs daily. Rinse mouth after each use. (Patient taking differently: Inhale 100 mcg into the lungs as needed. Rinse mouth after each use.) 1 each 6  . Ibuprofen-Diphenhydramine Cit ([employer]S IBUPROFEN PM) 200-38 MG TABS Take by mouth. As needed    . isosorbide mononitrate (IMDUR) 30 MG 24 hr tablet Take 1 tablet (30 mg total) by mouth daily. 90 tablet 3  . metoprolol succinate (TOPROL-XL) 50 MG 24 hr tablet Take 1 tablet (50 mg total) by mouth daily. Take with or immediately following a meal. 90 tablet 3  . niacin 500 MG tablet Take 500 mg by mouth daily. OTC    . nitroGLYCERIN (NITROSTAT) 0.3 MG SL tablet Place 0.3 mg under the tongue every 5 (five) minutes as needed for chest pain.    Marland Kitchen omega-3 acid ethyl esters (LOVAZA) 1 g capsule Take 1 capsule by mouth  daily 90 capsule 3  . predniSONE (DELTASONE) 10 MG tablet Take 2 tabs daily x 3 days  then 1 daily until gone 10 tablet 0  . ranolazine (RANEXA) 1000 MG SR tablet Take 1 tablet (1,000 mg total) by mouth 2 (two) times daily. 180 tablet 3  . rosuvastatin (CRESTOR) 20 MG tablet Take 1 tablet by mouth  daily 90 tablet 3  . ZETIA 10 MG tablet Take 1 tablet by mouth  every morning 90 tablet 3   No current facility-administered medications on file prior to visit.

## 2015-10-13 NOTE — Telephone Encounter (Signed)
We are seeing a lot of this viral bronchitis. It is very slow to go away, but more antibiotics are not likely to help Recommend continue to use the bronchodilator inhaler                        Stay well hydrated                         Offer benzonatate perles 200 mg    1 three times daily if needed for cough

## 2015-10-13 NOTE — Telephone Encounter (Signed)
Called spoke with spouse. Aware of recs below. She did not want the tess pearls called in. Will take the cough syrup they have at home. Nothing further needed

## 2015-10-22 DIAGNOSIS — F330 Major depressive disorder, recurrent, mild: Secondary | ICD-10-CM | POA: Diagnosis not present

## 2015-10-22 DIAGNOSIS — I10 Essential (primary) hypertension: Secondary | ICD-10-CM | POA: Diagnosis not present

## 2015-10-22 DIAGNOSIS — E1165 Type 2 diabetes mellitus with hyperglycemia: Secondary | ICD-10-CM | POA: Diagnosis not present

## 2015-10-22 DIAGNOSIS — Z6829 Body mass index (BMI) 29.0-29.9, adult: Secondary | ICD-10-CM | POA: Diagnosis not present

## 2015-10-22 DIAGNOSIS — I25729 Atherosclerosis of autologous artery coronary artery bypass graft(s) with unspecified angina pectoris: Secondary | ICD-10-CM | POA: Diagnosis not present

## 2015-10-22 DIAGNOSIS — E119 Type 2 diabetes mellitus without complications: Secondary | ICD-10-CM | POA: Diagnosis not present

## 2015-10-22 DIAGNOSIS — E1136 Type 2 diabetes mellitus with diabetic cataract: Secondary | ICD-10-CM | POA: Diagnosis not present

## 2015-11-07 ENCOUNTER — Encounter: Payer: Self-pay | Admitting: Internal Medicine

## 2015-11-07 ENCOUNTER — Other Ambulatory Visit: Payer: Self-pay | Admitting: Cardiovascular Disease

## 2015-11-07 ENCOUNTER — Ambulatory Visit (INDEPENDENT_AMBULATORY_CARE_PROVIDER_SITE_OTHER): Payer: Medicare Other | Admitting: Internal Medicine

## 2015-11-07 ENCOUNTER — Ambulatory Visit (INDEPENDENT_AMBULATORY_CARE_PROVIDER_SITE_OTHER)
Admission: RE | Admit: 2015-11-07 | Discharge: 2015-11-07 | Disposition: A | Discharge status: Home / Self Care | Payer: Medicare Other | Source: Ambulatory Visit | Attending: Internal Medicine | Admitting: Internal Medicine

## 2015-11-07 VITALS — BP 124/76 | HR 79 | Ht 70.0 in | Wt 197.6 lb

## 2015-11-07 DIAGNOSIS — G4733 Obstructive sleep apnea (adult) (pediatric): Secondary | ICD-10-CM | POA: Diagnosis not present

## 2015-11-07 DIAGNOSIS — Z77090 Contact with and (suspected) exposure to asbestos: Secondary | ICD-10-CM

## 2015-11-07 DIAGNOSIS — R0602 Shortness of breath: Secondary | ICD-10-CM | POA: Diagnosis not present

## 2015-11-07 DIAGNOSIS — J449 Chronic obstructive pulmonary disease, unspecified: Secondary | ICD-10-CM | POA: Diagnosis not present

## 2015-11-07 MED ORDER — FLUTICASONE FUROATE-VILANTEROL 100-25 MCG/INH IN AEPB: INHALATION_SPRAY | RESPIRATORY_TRACT | Status: DC

## 2015-11-07 MED ORDER — ALBUTEROL SULFATE HFA 108 (90 BASE) MCG/ACT IN AERS: 2.0000 | INHALATION_SPRAY | Freq: Four times a day (QID) | RESPIRATORY_TRACT | Status: AC | PRN

## 2015-11-07 NOTE — Patient Instructions (Addendum)
Refill scripts for Breo and albuterol sent to mail order  Order- CXR   Dx COPD mixed type, hx asbestos exposure

## 2015-11-07 NOTE — Assessment & Plan Note (Signed)
He feels he sleeps comfortably as long as she can elevate with recliner. He could not tolerate CPAP and has not chosen to seek oral appliance or other interventions.

## 2015-11-07 NOTE — Telephone Encounter (Signed)
Rx request sent to pharmacy.  

## 2015-11-07 NOTE — Assessment & Plan Note (Signed)
Slow to get over a bad viral syndrome chest cold/bronchitis this winter but nearing baseline now. We discussed medications and are refilling his Breo and albuterol rescue inhaler

## 2015-11-07 NOTE — Progress Notes (Signed)
55 yoM self referred for  COPD with emphysema diagnosed 3419, complicated by CAD/CABG 4V, DM.       Here with wife Smoked one pack per day until quitting in 2000. Retired from [employer] with associated dust. 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 He says this was associated with asbestos exposure from brake dust. Issues are in legal consideration and he notes that no diagnosis has been made. Has had pneumonia twice, and pneumonia vaccine. History of sleep apnea diagnosis but quit CPAP. Wife confirms he snores wearing a chin strap and sleeping in a recliner. Significant injury at work 01/17/1999, broken tibia and "degloving" of R  lower leg.  01/10/13- self referred for  COPD with emphysema diagnosed 3790, OSA, complicated by CAD/CABG 4V, DM, hx major trauma leg.       Here with wife Mowing the yard with mask in dusty but will still cough. Wife says if he lies down a recliner he holds his breath saturation will drop to 86%.he says he has been wearing his CPAP again/Advanced, but she describes apneas when he is sleeping without it.  PFT 01/01/2013: Minimal obstructive airways disease, air trapping with increased residual volume, mild response to bronchodilator, diffusion slightly reduced. FVC 4.11/96%, FEV1 2.59/82%, FEV1/FVC 0.63/85%, FEF 25-75% 1.63/68%. TLC 100%, DLCO 76%. CXR 11/23/12 IMPRESSION:  Underlying emphysema. No edema or consolidation.  Original Report Authenticated By: Daniel Singleton, M.D.  03/05/13- self referred for  COPD with emphysema diagnosed 2409, OSA, complicated by CAD/CABG 4V, DM, hx major trauma leg.       Here with wife FOLLOWS FOR: pt reports breathing is doing well-- states concentrator is making a "racket and is on its last leg"-- denies any other concerns at this time. CPAP Autopap/ Advanced Mask hurts bridge of nose if he stretches it on too tightly. Oxygen concentrator is getting loud. Compliance okay but inadequate control  based on download. Needs broader pressure range for autotitration.  09/04/13- 67 yoM former smoker self referred for  COPD with emphysema, hx asbestos exposure, OSA, complicated by CAD/CABG 4V, DM, hx major trauma leg.       Here with wife CPAP AutoPAP Advanced- not using-blows too hard. Does wearing oxygen 3 L/Advanced for sleep. Wife says she rarely notices apneas now when he is not wearing CPAP. FOLLOWS FOR: Pt states breathing is doing well since last visit; recent hospital for intestine blockage. Pending XRT for prostate cancer  11/20/13- 72 yoM former smoker self referred for  COPD with emphysema, hx asbestos exposure, OSA, complicated by CAD/CABG 4V, DM, hx major trauma leg, prostate Ca.       Here with wife FOLLOWS FOR: wears CPAPAuto or 10?/ Advanced every night for about 6 hours; Pt states [facility] did ONO since visit off CPAP- seeking report Pressure working well for patient; DME is [facility], Still using oxygen at 3 L. No recent nitroglycerin. Not using inhalers. Recent hospital for small bowel obstruction.   05/13/14- 86 yoM former smoker self referred for  COPD with emphysema, hx asbestos exposure, OSA, complicated by CAD/CABG 4V, DM, hx major trauma leg, prostate Ca.       Here with wife follows for: Pt states he was off CPAP auto/+ O2 3L Advanced for several months while getting XRT for prostate cancer, but started back x 2 weeks ago. He is wearing it every night for about 8 hours a night.  Sleeps in a recliner. COPD: Pt denies any increase in SOB,  cough at this time.  CXR 03/19/14 IMPRESSION:  No acute abnormalities.  Electronically Signed  By: Rozetta Nunnery M.D.  On: 03/19/2014 20:24  11/11/14-73 yoM former smoker self referred for  COPD with emphysema, hx asbestos exposure, OSA, complicated by CAD/CABG 4V, DM, hx major trauma leg, prostate Ca.       Here with wife FOLLOWS FOR: Has not worn CPAP in a while; pt states Memory [employer] is helping with breathing,  03/13/15- 41 yoM former smoker self referred  for  COPD with emphysema, hx asbestos exposure, OSA/ failed CPAP, complicated by CAD/CABG 4V, DM, hx major trauma leg, prostate Ca.       Here with wife FOLLOWS FOR: Pt states he is wearing 3 lpm/  nightly. Not wearing CPAP. Pt denies any current complaints.  FOLLOWS FOR: Pt not taking Breo daily, pt taking it as needed. Pt states he has noticed he is more SOB with the humid weather. Pt denies cough and CP/tightness. Pt had a recent stress test on 7.8.16 and is scheduled to see Dr. Claiborne Billings 8.3.16 He dislikes CPAP and stopped using it. Sleeps sitting up in a recliner and wearing oxygen. His cardiologist, Dr. Claiborne Billings, was concerned about his sleep apnea status and recommended a repeat sleep study. With discussion here and pressure from his wife, the patient reluctantly agrees.  07/29/2015-75 year old male former smoker followed for COPD/emphysema, asbestos exposure, OSA, complicated by CAD/CABG 4V, DM, history major trauma leg, prostate CA   Wife here NPSG 06/01/15- AHI 48.1/ hr, desat to 81%, CPAP to 13, body weight 220 pounds Since the sleep study we had intended change to auto 5-15 but that did not get done. CPAP is still on 10 and he is bleeding in home oxygen although oxygenation was corrected by CPAP during his sleep study. Reviewed sleep study results and discuss treatment implications. Has been using oxygen but not clear he will need it if CPAP control successful. He agrees to restart CPAP using AutoPap this time  11/07/2015-74 year old male former smoker followed for COPD/emphysema, asbestos exposure, OSA, complicated by CAD/CABG 4V, DM, history major trauma leg, prostate CA,       wife here CPAP auto 5-15/O2 2 L/Advanced--- both dc'd FOLLOWS FOR: Pt states he is no longer wearing CPAP or O2 through [facility]. ONO showed pt no longer needed O2.  Pt states he has been sick for 1 month now and can not get over the SOB and fatigue as well.  He and his wife both had significant flulike illness starting about  4 weeks ago. Temperature maximum was 102 with bad dry cough. Now almost cleared but still feels washed out. Continues to sleep in recliner, sleeping better and with less back pain sitting upright  ROS-see HPI Constitutional:   No-   weight loss, night sweats, fevers, chills, +fatigue, lassitude. HEENT:   No-  headaches, difficulty swallowing, tooth/dental problems, sore throat,       No-  sneezing, itching, ear ache, nasal congestion, post nasal drip,  CV:  No-   chest pain, orthopnea, PND, swelling in lower extremities, anasarca, dizziness, palpitations Resp: + shortness of breath with exertion or at rest.              No-   productive cough,  + non-productive cough,  No- coughing up of blood.              No-   change in color of mucus.  No- wheezing.   Skin: No-   rash or lesions. GI:  No-   heartburn, indigestion, abdominal pain, nausea, vomiting, GU:  MS:  No-   joint pain or swelling.  . Neuro-     nothing unusual Psych:  No- change in mood or affect. + depression or anxiety.  No memory loss.  OBJ- Physical Exam General- Alert, Oriented, Affect-appropriate, Distress- none acute. + Mild overweight Skin- rash-none, lesions- none, excoriation- none Lymphadenopathy- none Head- atraumatic            Eyes- Gross vision intact, PERRLA, conjunctivae and secretions clear            Ears- Hearing, canals-normal            Nose-  Clear, no-Septal dev, mucus, polyps, erosion, perforation             Throat- Mallampati II , mucosa clear , drainage- none, tonsils- atrophic Neck- flexible , trachea midline, no stridor , thyroid nl, carotid no bruit Chest - symmetrical excursion , unlabored           Heart/CV- RRR , no murmur , no gallop  , no rub, nl s1 s2                           - JVD- none , edema- none, stasis changes- none, varices- none           Lung- +diminished but clear to P&A, wheeze- none, cough-none , dullness-none, rub- none           Chest wall-  Abd-  Br/ Gen/ Rectal- Not  done, not indicated Extrem- cyanosis- none, clubbing, none, atrophy- none, strength- nl. -+cane. Support stockings Neuro- grossly intact to observation

## 2015-11-07 NOTE — Assessment & Plan Note (Signed)
Need to update chest x-ray in this former smoker with history of asbestos exposure

## 2015-11-10 DIAGNOSIS — C61 Malignant neoplasm of prostate: Secondary | ICD-10-CM | POA: Diagnosis not present

## 2015-11-12 ENCOUNTER — Other Ambulatory Visit: Payer: Self-pay | Admitting: *Deleted

## 2015-12-08 DIAGNOSIS — I10 Essential (primary) hypertension: Secondary | ICD-10-CM | POA: Diagnosis not present

## 2015-12-08 DIAGNOSIS — I8311 Varicose veins of right lower extremity with inflammation: Secondary | ICD-10-CM | POA: Diagnosis not present

## 2015-12-08 DIAGNOSIS — Z6829 Body mass index (BMI) 29.0-29.9, adult: Secondary | ICD-10-CM | POA: Diagnosis not present

## 2015-12-09 ENCOUNTER — Other Ambulatory Visit (HOSPITAL_COMMUNITY): Payer: Self-pay | Admitting: Internal Medicine

## 2015-12-09 ENCOUNTER — Ambulatory Visit (HOSPITAL_COMMUNITY)
Admission: RE | Admit: 2015-12-09 | Discharge: 2015-12-09 | Disposition: A | Discharge status: Home / Self Care | Payer: Medicare Other | Source: Ambulatory Visit | Attending: Surgery | Admitting: Surgery

## 2015-12-09 DIAGNOSIS — I8391 Asymptomatic varicose veins of right lower extremity: Secondary | ICD-10-CM | POA: Insufficient documentation

## 2015-12-09 DIAGNOSIS — E7800 Pure hypercholesterolemia, unspecified: Secondary | ICD-10-CM | POA: Insufficient documentation

## 2015-12-09 DIAGNOSIS — I10 Essential (primary) hypertension: Secondary | ICD-10-CM | POA: Insufficient documentation

## 2015-12-09 DIAGNOSIS — K219 Gastro-esophageal reflux disease without esophagitis: Secondary | ICD-10-CM | POA: Insufficient documentation

## 2015-12-09 DIAGNOSIS — I2510 Atherosclerotic heart disease of native coronary artery without angina pectoris: Secondary | ICD-10-CM | POA: Diagnosis not present

## 2015-12-09 DIAGNOSIS — M79604 Pain in right leg: Secondary | ICD-10-CM

## 2015-12-09 DIAGNOSIS — E119 Type 2 diabetes mellitus without complications: Secondary | ICD-10-CM | POA: Insufficient documentation

## 2015-12-09 DIAGNOSIS — I8311 Varicose veins of right lower extremity with inflammation: Secondary | ICD-10-CM

## 2015-12-16 DIAGNOSIS — G8921 Chronic pain due to trauma: Secondary | ICD-10-CM | POA: Diagnosis not present

## 2015-12-16 DIAGNOSIS — M545 Low back pain: Secondary | ICD-10-CM | POA: Diagnosis not present

## 2015-12-16 DIAGNOSIS — M6281 Muscle weakness (generalized): Secondary | ICD-10-CM | POA: Diagnosis not present

## 2015-12-23 DIAGNOSIS — M545 Low back pain: Secondary | ICD-10-CM | POA: Diagnosis not present

## 2015-12-23 DIAGNOSIS — M6281 Muscle weakness (generalized): Secondary | ICD-10-CM | POA: Diagnosis not present

## 2015-12-23 DIAGNOSIS — G8921 Chronic pain due to trauma: Secondary | ICD-10-CM | POA: Diagnosis not present

## 2015-12-25 DIAGNOSIS — M545 Low back pain: Secondary | ICD-10-CM | POA: Diagnosis not present

## 2015-12-25 DIAGNOSIS — G8921 Chronic pain due to trauma: Secondary | ICD-10-CM | POA: Diagnosis not present

## 2015-12-25 DIAGNOSIS — M6281 Muscle weakness (generalized): Secondary | ICD-10-CM | POA: Diagnosis not present

## 2015-12-30 DIAGNOSIS — M545 Low back pain: Secondary | ICD-10-CM | POA: Diagnosis not present

## 2015-12-30 DIAGNOSIS — M6281 Muscle weakness (generalized): Secondary | ICD-10-CM | POA: Diagnosis not present

## 2015-12-30 DIAGNOSIS — G8921 Chronic pain due to trauma: Secondary | ICD-10-CM | POA: Diagnosis not present

## 2016-01-01 DIAGNOSIS — M6281 Muscle weakness (generalized): Secondary | ICD-10-CM | POA: Diagnosis not present

## 2016-01-01 DIAGNOSIS — M545 Low back pain: Secondary | ICD-10-CM | POA: Diagnosis not present

## 2016-01-01 DIAGNOSIS — G8921 Chronic pain due to trauma: Secondary | ICD-10-CM | POA: Diagnosis not present

## 2016-01-06 DIAGNOSIS — M545 Low back pain: Secondary | ICD-10-CM | POA: Diagnosis not present

## 2016-01-06 DIAGNOSIS — M6281 Muscle weakness (generalized): Secondary | ICD-10-CM | POA: Diagnosis not present

## 2016-01-06 DIAGNOSIS — G8921 Chronic pain due to trauma: Secondary | ICD-10-CM | POA: Diagnosis not present

## 2016-01-08 DIAGNOSIS — M545 Low back pain: Secondary | ICD-10-CM | POA: Diagnosis not present

## 2016-01-08 DIAGNOSIS — G8921 Chronic pain due to trauma: Secondary | ICD-10-CM | POA: Diagnosis not present

## 2016-01-08 DIAGNOSIS — M6281 Muscle weakness (generalized): Secondary | ICD-10-CM | POA: Diagnosis not present

## 2016-01-15 DIAGNOSIS — M545 Low back pain: Secondary | ICD-10-CM | POA: Diagnosis not present

## 2016-01-15 DIAGNOSIS — M6281 Muscle weakness (generalized): Secondary | ICD-10-CM | POA: Diagnosis not present

## 2016-01-15 DIAGNOSIS — G8921 Chronic pain due to trauma: Secondary | ICD-10-CM | POA: Diagnosis not present

## 2016-01-20 DIAGNOSIS — I25729 Atherosclerosis of autologous artery coronary artery bypass graft(s) with unspecified angina pectoris: Secondary | ICD-10-CM | POA: Diagnosis not present

## 2016-01-20 DIAGNOSIS — E119 Type 2 diabetes mellitus without complications: Secondary | ICD-10-CM | POA: Diagnosis not present

## 2016-01-20 DIAGNOSIS — E1140 Type 2 diabetes mellitus with diabetic neuropathy, unspecified: Secondary | ICD-10-CM | POA: Diagnosis not present

## 2016-01-20 DIAGNOSIS — M6281 Muscle weakness (generalized): Secondary | ICD-10-CM | POA: Diagnosis not present

## 2016-01-20 DIAGNOSIS — I10 Essential (primary) hypertension: Secondary | ICD-10-CM | POA: Diagnosis not present

## 2016-01-20 DIAGNOSIS — Z6829 Body mass index (BMI) 29.0-29.9, adult: Secondary | ICD-10-CM | POA: Diagnosis not present

## 2016-01-20 DIAGNOSIS — M545 Low back pain: Secondary | ICD-10-CM | POA: Diagnosis not present

## 2016-01-20 DIAGNOSIS — E1165 Type 2 diabetes mellitus with hyperglycemia: Secondary | ICD-10-CM | POA: Diagnosis not present

## 2016-01-20 DIAGNOSIS — G8921 Chronic pain due to trauma: Secondary | ICD-10-CM | POA: Diagnosis not present

## 2016-02-02 DIAGNOSIS — E119 Type 2 diabetes mellitus without complications: Secondary | ICD-10-CM | POA: Diagnosis not present

## 2016-02-02 DIAGNOSIS — Z0100 Encounter for examination of eyes and vision without abnormal findings: Secondary | ICD-10-CM | POA: Diagnosis not present

## 2016-02-03 DIAGNOSIS — M6281 Muscle weakness (generalized): Secondary | ICD-10-CM | POA: Diagnosis not present

## 2016-02-03 DIAGNOSIS — M545 Low back pain: Secondary | ICD-10-CM | POA: Diagnosis not present

## 2016-02-03 DIAGNOSIS — G8921 Chronic pain due to trauma: Secondary | ICD-10-CM | POA: Diagnosis not present

## 2016-02-05 DIAGNOSIS — G8921 Chronic pain due to trauma: Secondary | ICD-10-CM | POA: Diagnosis not present

## 2016-02-05 DIAGNOSIS — M6281 Muscle weakness (generalized): Secondary | ICD-10-CM | POA: Diagnosis not present

## 2016-02-05 DIAGNOSIS — M545 Low back pain: Secondary | ICD-10-CM | POA: Diagnosis not present

## 2016-02-12 DIAGNOSIS — M6281 Muscle weakness (generalized): Secondary | ICD-10-CM | POA: Diagnosis not present

## 2016-02-12 DIAGNOSIS — G8921 Chronic pain due to trauma: Secondary | ICD-10-CM | POA: Diagnosis not present

## 2016-02-12 DIAGNOSIS — M545 Low back pain: Secondary | ICD-10-CM | POA: Diagnosis not present

## 2016-02-23 DIAGNOSIS — G8921 Chronic pain due to trauma: Secondary | ICD-10-CM | POA: Diagnosis not present

## 2016-02-23 DIAGNOSIS — M6281 Muscle weakness (generalized): Secondary | ICD-10-CM | POA: Diagnosis not present

## 2016-02-23 DIAGNOSIS — M545 Low back pain: Secondary | ICD-10-CM | POA: Diagnosis not present

## 2016-03-01 DIAGNOSIS — M6281 Muscle weakness (generalized): Secondary | ICD-10-CM | POA: Diagnosis not present

## 2016-03-01 DIAGNOSIS — G8921 Chronic pain due to trauma: Secondary | ICD-10-CM | POA: Diagnosis not present

## 2016-03-01 DIAGNOSIS — M545 Low back pain: Secondary | ICD-10-CM | POA: Diagnosis not present

## 2016-04-16 DIAGNOSIS — E784 Other hyperlipidemia: Secondary | ICD-10-CM | POA: Diagnosis not present

## 2016-04-16 DIAGNOSIS — I10 Essential (primary) hypertension: Secondary | ICD-10-CM | POA: Diagnosis not present

## 2016-04-16 DIAGNOSIS — E119 Type 2 diabetes mellitus without complications: Secondary | ICD-10-CM | POA: Diagnosis not present

## 2016-04-16 DIAGNOSIS — Z125 Encounter for screening for malignant neoplasm of prostate: Secondary | ICD-10-CM | POA: Diagnosis not present

## 2016-04-27 DIAGNOSIS — F330 Major depressive disorder, recurrent, mild: Secondary | ICD-10-CM | POA: Diagnosis not present

## 2016-04-27 DIAGNOSIS — E1136 Type 2 diabetes mellitus with diabetic cataract: Secondary | ICD-10-CM | POA: Diagnosis not present

## 2016-04-27 DIAGNOSIS — Z1389 Encounter for screening for other disorder: Secondary | ICD-10-CM | POA: Diagnosis not present

## 2016-04-27 DIAGNOSIS — G4733 Obstructive sleep apnea (adult) (pediatric): Secondary | ICD-10-CM | POA: Diagnosis not present

## 2016-04-27 DIAGNOSIS — E1140 Type 2 diabetes mellitus with diabetic neuropathy, unspecified: Secondary | ICD-10-CM | POA: Diagnosis not present

## 2016-04-27 DIAGNOSIS — Z6829 Body mass index (BMI) 29.0-29.9, adult: Secondary | ICD-10-CM | POA: Diagnosis not present

## 2016-04-27 DIAGNOSIS — I25729 Atherosclerosis of autologous artery coronary artery bypass graft(s) with unspecified angina pectoris: Secondary | ICD-10-CM | POA: Diagnosis not present

## 2016-04-27 DIAGNOSIS — E784 Other hyperlipidemia: Secondary | ICD-10-CM | POA: Diagnosis not present

## 2016-04-27 DIAGNOSIS — F418 Other specified anxiety disorders: Secondary | ICD-10-CM | POA: Diagnosis not present

## 2016-04-27 DIAGNOSIS — E119 Type 2 diabetes mellitus without complications: Secondary | ICD-10-CM | POA: Diagnosis not present

## 2016-04-27 DIAGNOSIS — I10 Essential (primary) hypertension: Secondary | ICD-10-CM | POA: Diagnosis not present

## 2016-04-27 DIAGNOSIS — Z0000 Encounter for general adult medical examination without abnormal findings: Secondary | ICD-10-CM | POA: Diagnosis not present

## 2016-05-11 ENCOUNTER — Ambulatory Visit (INDEPENDENT_AMBULATORY_CARE_PROVIDER_SITE_OTHER): Payer: Medicare Other | Admitting: Internal Medicine

## 2016-05-11 ENCOUNTER — Encounter: Payer: Self-pay | Admitting: Internal Medicine

## 2016-05-11 DIAGNOSIS — G4733 Obstructive sleep apnea (adult) (pediatric): Secondary | ICD-10-CM

## 2016-05-11 DIAGNOSIS — J449 Chronic obstructive pulmonary disease, unspecified: Secondary | ICD-10-CM

## 2016-05-11 NOTE — Progress Notes (Signed)
03/13/15- 20 yoM former smoker self referred for  COPD with emphysema, hx asbestos exposure, OSA/ failed CPAP, complicated by CAD/CABG 4V, DM, hx major trauma leg, prostate Ca.       Here with wife FOLLOWS FOR: Pt states he is wearing 3 lpm/  nightly. Not wearing CPAP. Pt denies any current complaints.  FOLLOWS FOR: Pt not taking Breo daily, pt taking it as needed. Pt states he has noticed he is more SOB with the humid weather. Pt denies cough and CP/tightness. Pt had a recent stress test on 7.8.16 and is scheduled to see Dr. Claiborne Billings 8.3.16 He dislikes CPAP and stopped using it. Sleeps sitting up in a recliner and wearing oxygen. His cardiologist, Dr. Claiborne Billings, was concerned about his sleep apnea status and recommended a repeat sleep study. With discussion here and pressure from his wife, the patient reluctantly agrees.  07/29/2015-75 year old male former smoker followed for COPD/emphysema, asbestos exposure, OSA, complicated by CAD/CABG 4V, DM, history major trauma leg, prostate CA   Wife here NPSG 06/01/15- AHI 48.1/ hr, desat to 81%, CPAP to 13, body weight 220 pounds Since the sleep study we had intended change to auto 5-15 but that did not get done. CPAP is still on 10 and he is bleeding in home oxygen although oxygenation was corrected by CPAP during his sleep study. Reviewed sleep study results and discuss treatment implications. Has been using oxygen but not clear he will need it if CPAP control successful. He agrees to restart CPAP using AutoPap this time  11/07/2015-75 year old male former smoker followed for COPD/emphysema, asbestos exposure, OSA, complicated by CAD/CABG 4V, DM, history major trauma leg, prostate CA,       wife here CPAP auto 5-15/O2 2 L/Advanced--- both dc'd FOLLOWS FOR: Pt states he is no longer wearing CPAP or O2 through [facility]. ONO showed pt no longer needed O2.  Pt states he has been sick for 1 month now and can not get over the SOB and fatigue as well.  He and his wife both  had significant flulike illness starting about 4 weeks ago. Temperature maximum was 102 with bad dry cough. Now almost cleared but still feels washed out. Continues to sleep in recliner, sleeping better and with less back pain sitting upright  05/11/2016-75 year old male former smoker followed for COPD/emphysema, asbestos exposure, OSA/ failed CPAP, complicated by CAD/CABG 4V, DM, history major trauma leg, prostate CVA FOLLOWS FOR: breathing is doing well, no new complaints.  has not been wearing CPAP x4 months d/t blister on his nose from the mask.   Wife here He has not been using CPAP and does not intend to resume. He is not interested in alternatives. Usually sleeps sitting up in a recliner. Feels very comfortable with his breathing without acute attacks routine cough or wheeze. He uses his inhalers "when I need to". CXR 11/07/15-no acute disease. Minor scarring. Old thoracic compression fracture.  ROS-see HPI Constitutional:   No-   weight loss, night sweats, fevers, chills, +fatigue, lassitude. HEENT:   No-  headaches, difficulty swallowing, tooth/dental problems, sore throat,       No-  sneezing, itching, ear ache, nasal congestion, post nasal drip,  CV:  No-   chest pain, orthopnea, PND, swelling in lower extremities, anasarca, dizziness, palpitations Resp: + shortness of breath with exertion or at rest.              No-   productive cough,  + non-productive cough,  No- coughing up of blood.  No-   change in color of mucus.  No- wheezing.   Skin: No-   rash or lesions. GI:  No-   heartburn, indigestion, abdominal pain, nausea, vomiting, GU:  MS:  No-   joint pain or swelling.  . Neuro-     nothing unusual Psych:  No- change in mood or affect. + depression or anxiety.  No memory loss.  OBJ- Physical Exam General- Alert, Oriented, Affect-appropriate, Distress- none acute. + Mild overweight, looks very comfortable Skin- rash-none, lesions- none, excoriation-  none Lymphadenopathy- none Head- atraumatic            Eyes- Gross vision intact, PERRLA, conjunctivae and secretions clear            Ears- Hearing, canals-normal            Nose-  Clear, no-Septal dev, mucus, polyps, erosion, perforation             Throat- Mallampati II , mucosa clear , drainage- none, tonsils- atrophic Neck- flexible , trachea midline, no stridor , thyroid nl, carotid no bruit Chest - symmetrical excursion , unlabored           Heart/CV- RRR , no murmur , no gallop  , no rub, nl s1 s2                           - JVD- none , edema- none, stasis changes- none, varices- none           Lung- +diminished but clear to P&A, wheeze- none, cough-none , dullness-none, rub- none           Chest wall-  Abd-  Br/ Gen/ Rectal- Not done, not indicated Extrem- cyanosis- none, clubbing, none, atrophy- none, strength- nl. -+cane. Support stockings Neuro- grossly intact to observation

## 2016-05-11 NOTE — Assessment & Plan Note (Signed)
He doesn't feel he needs to be taking anything right now. I discussed function of Breo as a maintenance controller needing a few days of regular use to reach maximum benefit-so don't wait too long if an exacerbation begins. We reviewed his vaccination history.

## 2016-05-11 NOTE — Patient Instructions (Signed)
Ok to continue current meds- your primary doctor could refill them for you if you wish  We will be happy to see you if you need [place].

## 2016-05-11 NOTE — Assessment & Plan Note (Signed)
He stopped using oxygen and CPAP in 2016. Sleeps in a recliner. Overnight oximetry had shown adequate oxygenation.

## 2016-05-12 ENCOUNTER — Other Ambulatory Visit: Payer: Self-pay | Admitting: Cardiovascular Disease

## 2016-05-12 NOTE — Telephone Encounter (Signed)
Rx(s) sent to pharmacy electronically.  

## 2016-05-24 DIAGNOSIS — Z23 Encounter for immunization: Secondary | ICD-10-CM | POA: Diagnosis not present

## 2016-07-26 DIAGNOSIS — E119 Type 2 diabetes mellitus without complications: Secondary | ICD-10-CM | POA: Diagnosis not present

## 2016-07-26 DIAGNOSIS — Z6829 Body mass index (BMI) 29.0-29.9, adult: Secondary | ICD-10-CM | POA: Diagnosis not present

## 2016-07-26 DIAGNOSIS — E1140 Type 2 diabetes mellitus with diabetic neuropathy, unspecified: Secondary | ICD-10-CM | POA: Diagnosis not present

## 2016-07-26 DIAGNOSIS — I10 Essential (primary) hypertension: Secondary | ICD-10-CM | POA: Diagnosis not present

## 2016-07-26 DIAGNOSIS — E784 Other hyperlipidemia: Secondary | ICD-10-CM | POA: Diagnosis not present

## 2016-08-06 ENCOUNTER — Other Ambulatory Visit: Payer: Self-pay | Admitting: Cardiovascular Disease

## 2016-08-06 DIAGNOSIS — R350 Frequency of micturition: Secondary | ICD-10-CM | POA: Diagnosis not present

## 2016-08-06 DIAGNOSIS — C61 Malignant neoplasm of prostate: Secondary | ICD-10-CM | POA: Diagnosis not present

## 2016-09-15 ENCOUNTER — Other Ambulatory Visit: Payer: Self-pay | Admitting: Cardiovascular Disease

## 2016-09-15 NOTE — Telephone Encounter (Signed)
°*  STAT* If patient is at the pharmacy, call can be transferred to refill team.   1. Which medications need to be refilled? (please list name of each medication and dose if known) Rosuvastatin, Isosorbide, Metoprolol, Ranexa,Cloidogrel,Ezetimibe and Duloxetine 2. Which pharmacy/location (including street and city if local pharmacy) is medication to be sent to?[facility] [phone]  3. Do they need a 30 day or 90 day supply? 90 and refills for each*

## 2016-09-16 NOTE — Telephone Encounter (Signed)
Rx(s) sent to pharmacy electronically.  

## 2016-10-12 ENCOUNTER — Telehealth: Payer: Self-pay | Admitting: Cardiovascular Disease

## 2016-10-12 NOTE — Telephone Encounter (Signed)
Phone rings w/ no answer or VM pickup. 

## 2016-10-12 NOTE — Telephone Encounter (Signed)
New Message  Pt wife call requesting to speak with RN. Pt wife states pt could not stand up at all on Sunday. Pt wife states after returning for her appt today pt had a dizzy spell. appt was make for pt on 2/21 at 9:15am with Dr. Claiborne Billings. Pt wife would like to discuss with RN. Please call back.

## 2016-10-13 ENCOUNTER — Encounter: Payer: Self-pay | Admitting: Cardiovascular Disease

## 2016-10-13 ENCOUNTER — Ambulatory Visit (INDEPENDENT_AMBULATORY_CARE_PROVIDER_SITE_OTHER): Payer: Medicare Other | Admitting: Cardiovascular Disease

## 2016-10-13 VITALS — BP 96/62 | HR 69 | Ht 70.0 in | Wt 196.6 lb

## 2016-10-13 DIAGNOSIS — I2510 Atherosclerotic heart disease of native coronary artery without angina pectoris: Secondary | ICD-10-CM

## 2016-10-13 DIAGNOSIS — Z79899 Other long term (current) drug therapy: Secondary | ICD-10-CM

## 2016-10-13 DIAGNOSIS — R42 Dizziness and giddiness: Secondary | ICD-10-CM

## 2016-10-13 DIAGNOSIS — G4733 Obstructive sleep apnea (adult) (pediatric): Secondary | ICD-10-CM | POA: Diagnosis not present

## 2016-10-13 DIAGNOSIS — E785 Hyperlipidemia, unspecified: Secondary | ICD-10-CM | POA: Diagnosis not present

## 2016-10-13 DIAGNOSIS — I2583 Coronary atherosclerosis due to lipid rich plaque: Secondary | ICD-10-CM | POA: Diagnosis not present

## 2016-10-13 MED ORDER — ASPIRIN EC 81 MG PO TBEC: 81.0000 mg | DELAYED_RELEASE_TABLET | Freq: Every day | ORAL | 3 refills | Status: AC

## 2016-10-13 NOTE — Telephone Encounter (Signed)
Pt seen by MD today, see OV notes.

## 2016-10-13 NOTE — Progress Notes (Signed)
Patient ID: Daniel Singleton, male   DOB: 04-May-1941, 76 y.o.   MRN: [id number]    Primary M.D.: Dr. Jose Persia  HPI:  Daniel Singleton is a 76 year old gentleman who presents for a 14 month follow-up cardiology evaluation.  Daniel Singleton has a history of hypertension, hyperlipidemia, COPD, prior asbestos exposure, diabetes mellitus , and  prostate cancer. In January 2006, he underwent CABG surgery x4  in [facility] by Dr. Zorita Pang. I do not have the specifics of his bypass grafts. He had stress test in [place] in 2013 after he had experienced some recurrent chest pain episodes and a cardiac catheterization which showed mild blockages.  He also has a history of obstructive sleep apnea on CPAP therapy with 3 L of oxygen and is followed by Dr. Keturah Barre.  Daniel Singleton admits to mild shortness of breath.  He has a history of hyperlipidemia.   In the past he had been on isosorbide mononitrate therapy but stopped taking this because of libido concerns and potential need for medications for erectile function.  An echo Doppler study on 04/03/2013 showed an ejection fraction of 55-60%; mild tricuspid regurgitation, and grade 1 diastolic dysfunction. When I saw him, I recommended that we perform advanced lipid testing with NMR lipoprotein of since he has had low HDL levels and her recent cholesterol was 185 triglycerides 153 HDL 31 and LDL 123. At that time, I also started him on Ranexa 500 mg twice a day since he had discontinued his isosorbide. I added Zetia 10 mg to his atorvastatin and recommended that he undergo a followup with NMR lipoprotein assessment.  His NMR study was markedly abnormal. Specifically, his total cholesterol was 187 triglycerides 348 HDL 32 LDL cholesterol 85. However, he had markedly increased goal LDL particles at 1739 status LDL particle number was markedly increased at 2463. The patient tells me he did develop some myalgias on Lipitor and therefore for approximately the  last 3-4 weeks completely discontinued a torus that in therapy.  He was  hospitalized on December 26 through 08/21/2013 with a partial small bowel obstruction. He had experienced nausea and vomiting with diarrhea for 4 days prior to his admission and was found to have a partial mid-to high grade small bowel obstruction with a transition point in the right lower quadrant. He was cleared for surgery and if surgery was to be done he was to stop his Plavix and continue aspirin. Fortunately, his bowel obstruction resolved with medical management and surgical intervention was not necessary.  I'd seen him in January 2015 at which time he was off Plavix therapy and was not having any anginal symptoms on his current medical regimen.  He was hospitalized on 03/19/2014 through 03/21/2014 with ataxia.  He had complaints of vertigo with nausea, vomiting, double vision, numbness, and weakness.  A CT of the brain did not show acute intracranial abnormalities, but did show chronic atrophy and small vessel ischemic changes.  A 2-D echo Doppler study showed an EF of 45-50% without cardiac source of emboli.  Carotid studies revealed mild bilateral plaque with narrowing of 1-39% range.  Antegrade flow is vertebral artery.  Doppler of the brachial artery demonstrate triphasic waveforms.  He was felt possibly to have a posterior circulation TIA.  His aspirin 81 mg was increased to 325 mg.  and I last saw him in August 2015 due to his recent TIA.  I recommended reinitiation of Plavix 75 mg daily and reduced his aspirin dose back  to 81 mg.  When I saw several months ago he denied any significant chest pain but had experienced one short episode in February that was nonexertional.  However, he had noticed increasing exertional shortness of breath with activity.  He had stopped using his CPAP therapy and complained of increased fatigue.  He had not seen Dr. Annamaria Boots in some time and I recommended that when he saw him in July that  consideration for follow-up sleep study.  He at home.  At that time, he was planning to undergo cataract surgery sometime this fall.  Due to his exertional shortness of breath and his established coronary artery disease.  I recommended a nuclear perfusion study.  This was done on 02/28/2015 and was intermediate risk.  He developed mild ST segment depression and had occasional PACs and PVCs.  Scintigraphic images revealed a medium defect of moderate severity in the RCA distribution.  He also had an abnormal blood pressure response with hypotension during stress.  When I last saw him he was remaining completely asymptomatic.  Since that time, I was able to obtain the records from [place] to see if he had any significant inferior defect on prior nuclear studies.  Review his records dating back to 2006 including catheterization studies and subsequent nuclear imaging studies.  On the majority of his nuclear studies dating from 2007.  He was found to have mild fixed basal posterior lateral defect and there was some concern of possible attenuation artifact.  He was not found to have significant ischemia.  A nuclear study in 2011 was interpreted as indeterminate for ischemia with motion artifact.  His last study in [place] was in 2013 which again showed a fixed inferior defect and was felt that he had a low probability of ischemia.  Daniel Singleton states that since I last saw him, he had notes where he was confused staggered and was concerned about similar neurologic symptoms that he had in the past.  He had seen a neurologist for evaluation.  He also had an episode of dizziness yesterday and when he checked his blood pressure.  This was stable at 118/67 with a heart rate at 74.  He denies any recent chest pain.  He is unaware of palpitations.  He presents for evaluation.  Past Medical History:  Diagnosis Date  . Angina at [facility])   . Anxiety   . Arthritis   . Asthma   . Coronary artery disease    recently  started seeing Dr. Claiborne Billings   . Depression   . Diabetes ([place])   . Elevated PSA   . Emphysema   . GERD (gastroesophageal reflux disease)   . Hx of radiation therapy 09/20/13- 11/14/13   prostate 7800 cGy 40 sessions, seminal vesicles 5600 cGy in 40 sessions  . Hypercholesteremia   . Hypertension   . PONV (postoperative nausea and vomiting)   . Prostate cancer ([place]) 05/23/13   gleason 3+4=7, volume 31 cc  . Sleep apnea    c pap with oxygen at 2.5 L    Past Surgical History:  Procedure Laterality Date  . BACK SURGERY  2006   discectomy lumbar  . BACK SURGERY    . BACK SURGERY    . degloving rt leg injury  2000   had skin grafts  . FOOT SURGERY    . heart bypass  2006   [place]  . HERNIA REPAIR  2008   x 3 w/mesh, bilat inguinla, umbilical  . PROSTATE BIOPSY N/A 05/23/2013  Procedure: BIOPSY TRANSRECTAL ULTRASONIC PROSTATE (TUBP);  Surgeon: Molli Hazard, MD;  Location: [facility];  Service: Urology;  Laterality: N/A;  prostate nerve block  . TONSILLECTOMY     w/adenoidectomy    Allergies  Allergen Reactions  . Niacin And Related Itching    Reaction to Rx strength only, OTC is okay    Current Outpatient Prescriptions  Medication Sig Dispense Refill  . albuterol (PROVENTIL HFA;VENTOLIN HFA) 108 (90 Base) MCG/ACT inhaler Inhale 2 puffs into the lungs every 6 (six) hours as needed for wheezing. 3 Inhaler 3  . albuterol (PROVENTIL) (2.5 MG/3ML) 0.083% nebulizer solution Take 3 mLs (2.5 mg total) by nebulization every 6 (six) hours. As directed   DX: COPD emphysema 75 mL 11  . clopidogrel (PLAVIX) 75 MG tablet TAKE 1 TABLET BY MOUTH  DAILY. 90 tablet 2  . DULoxetine (CYMBALTA) 60 MG capsule Take 60 mg by mouth daily.    Marland Kitchen ezetimibe (ZETIA) 10 MG tablet TAKE 1 TABLET BY MOUTH  EVERY MORNING 90 tablet 2  . fluticasone furoate-vilanterol (BREO ELLIPTA) 100-25 MCG/INH AEPB Inhale 1 puff then rinse mouth, once daily 3 each 3  . Ibuprofen-Diphenhydramine Cit ([employer] IBUPROFEN PM) 200-38  MG TABS Take by mouth. As needed    . isosorbide mononitrate (IMDUR) 30 MG 24 hr tablet TAKE 1 TABLET BY MOUTH  DAILY 90 tablet 2  . metoprolol succinate (TOPROL-XL) 25 MG 24 hr tablet TAKE 1 TABLET BY MOUTH  DAILY 90 tablet 2  . niacin 500 MG tablet Take 500 mg by mouth daily. OTC    . nitroGLYCERIN (NITROSTAT) 0.3 MG SL tablet Place 0.3 mg under the tongue every 5 (five) minutes as needed for chest pain.    . NON FORMULARY Take 2 capsules by mouth daily. Glucocil    . omega-3 acid ethyl esters (LOVAZA) 1 g capsule TAKE 1 CAPSULE BY MOUTH  DAILY 90 capsule 2  . RANEXA 1000 MG SR tablet TAKE 1 TABLET BY MOUTH TWO  TIMES DAILY 120 tablet 2  . rosuvastatin (CRESTOR) 20 MG tablet TAKE 1 TABLET BY MOUTH  DAILY 90 tablet 2  . aspirin EC 81 MG tablet Take 1 tablet (81 mg total) by mouth daily. 90 tablet 3   No current facility-administered medications for this visit.     Social history is notable in that he is married to his 2 children ages 73 and 20. He smoked one pack of cigarettes per day until 2000. He does drink occasional alcohol. He does walk  Family History  Problem Relation Age of Onset  . Heart disease Mother   . Heart attack Mother   . Cancer Sister   . Depression Sister    ROS General: Negative; No fevers, chills, or night sweats;  HEENT: Negative; No changes in vision or hearing, sinus congestion, difficulty swallowing Pulmonary: Positive for asbestosis exposure No cough, wheezing, shortness of breath, hemoptysis Cardiovascular: Negative; No chest pain, presyncope, syncope, palpitations GI: Negative; No nausea, vomiting, diarrhea, or abdominal pain GU: Positive for prostate CA No dysuria, hematuria, or difficulty voiding Musculoskeletal: Remote degloving injury; no myalgias, joint pain, or weakness Hematologic/Oncology: Negative; no easy bruising, bleeding Endocrine: Negative; no heat/cold intolerance; no diabetes Neuro: See history of present illness, recent ataxia, probable  posterior circulation TIA Skin: Negative; No rashes or skin lesions Psychiatric: Negative; No behavioral problems, depression Sleep: Positive for obstructive sleep apnea with supplemental 3 L oxygen; he stopped using CPAP, apparently due to difficulties with his mask and potential  mask leak; since he stopped using CPAP.  He notes more fatigue, daytime sleepiness,; no bruxism, restless legs, hypnogognic hallucinations, no cataplexy Other comprehensive 14 point system review is negative.   PE BP 96/62 (BP Location: Right Arm, Patient Position: Sitting, Cuff Size: Normal)   Pulse 69   Ht _0  (1.778 m)   Wt 196 lb 9.6 oz (89.2 kg)   BMI 28.21 kg/m    Repeat blood pressure by me was 122/72, supine and 100/70 standing.  Wt Readings from Last 3 Encounters:  10/13/16 196 lb 9.6 oz (89.2 kg)  05/11/16 201 lb (91.2 kg)  11/07/15 197 lb 9.6 oz (89.6 kg)   General: Alert, oriented, no distress.  Skin: normal turgor, no rashes HEENT: Normocephalic, atraumatic. Pupils round and reactive; sclera anicteric; Fundi no hemorrhages or exudates Nose without nasal septal hypertrophy Mouth/Parynx benign; Mallinpatti scale 3 Neck: No JVD, no carotid bruits Lungs: clear to ausculatation and percussion; no wheezing or rales Chest wall: No tenderness to palpation Heart: RRR, s1 s2 normal 1/6 systolic murmur; no diastolic murmur.  No rubs, thrills or heaves Abdomen: Mild diastases recti; soft, nontender; no hepatosplenomehaly, BS+; abdominal aorta nontender and not dilated by palpation. Back: No CVA tenderness. Pulses 2+ Extremities: no clubbinbg cyanosis or edema, Homan's sign negative  Neurologic: grossly nonfocal Psychological: Normal affect and mood; normal cognition  ECG (independently read by me): Normal sinus rhythm with mild sinus arrhythmia.  Heart rate 69.  PAC.  No significant ST changes.  Normal intervals.  December 2016 ECG (independently read by me): Normal sinus rhythm at 71 bpm with   PACs.  QTc interval normal at 439.  June 2016 ECG (independently read by me): Sinus rhythm with sinus arrhythmia, heart rate averaging 68 bpm.  Intervals normal.  December 2015 ECG (independently read by me): Normal sinus rhythm with mild sinus arrhythmia and PACs with ventricular rate at 76 bpm.  QTc interval 427 ms.  No significant ST segment changes.  August 2015 ECG (independently read by me): Sinus rhythm with sinus arrhythmia.  Nonspecific T changes.  QTc interval 421 ms.  08/30/2013 ECG independently read by me today shows normal sinus rhythm with sinus arrhythmia with a ventricular rate in the 80s. They're nonspecific ST-T changes.  Prior ECG in October 2014: Sinus rhythm with mild sinus arrhythmia at 60 beats per minute. PR interval 172 ms. QTC interval 384 ms.  LABS: BMP Latest Ref Rng & Units 02/17/2015 03/20/2014 03/19/2014  Glucose 70 - 99 mg/dL 151(H) 152(H) 173(H)  BUN 6 - 23 mg/dL _1 Creatinine 0.50 - 1.35 mg/dL 1.03 0.90 1.01  Sodium 135 - 145 mEq/L 140 139 138  Potassium 3.5 - 5.3 mEq/L 4.4 4.1 4.2  Chloride 96 - 112 mEq/L 101 99 98  CO2 19 - 32 mEq/L _2 Calcium 8.4 - 10.5 mg/dL 9.3 8.9 8.9   Hepatic Function Latest Ref Rng & Units 02/17/2015 03/20/2014 08/21/2013  Total Protein 6.0 - 8.3 g/dL 6.3 6.2 6.4  Albumin 3.5 - 5.2 g/dL 4.1 3.5 3.1(L)  AST 0 - 37 U/L _3 ALT 0 - 53 U/L _4 Alk Phosphatase 39 - 117 U/L 31(L) 46 35(L)  Total Bilirubin 0.2 - 1.2 mg/dL 0.6 0.3 0.6   CBC Latest Ref Rng & Units 02/17/2015 03/20/2014 03/19/2014  WBC 4.0 - 10.5 K/uL 5.3 8.0 8.4  Hemoglobin 13.0 - 17.0 g/dL 14.1 13.2 13.1  Hematocrit 39.0 - 52.0 % 41.2 38.8(L)  37.5(L)  Platelets 150 - 400 K/uL 137(L) 130(L) PLATELET CLUMPS NOTED ON SMEAR, COUNT APPEARS DECREASED   Lab Results  Component Value Date   MCV 89.8 02/17/2015   MCV 88.2 03/20/2014   MCV 89.1 03/19/2014   Lab Results  Component Value Date   TSH 2.601 02/17/2015   Lab Results  Component Value  Date   HGBA1C 6.5 (H) 03/20/2014   Lipid Panel     Component Value Date/Time   CHOL 113 02/17/2015 0859   CHOL 118 08/01/2013 0952   TRIG 75 02/17/2015 0859   TRIG 96 08/01/2013 0952   HDL 36 (L) 02/17/2015 0859   HDL 46 08/01/2013 0952   CHOLHDL 3.1 02/17/2015 0859   VLDL 15 02/17/2015 0859   LDLCALC 62 02/17/2015 0859   LDLCALC 53 08/01/2013 0952    Blood work done by Dr. Ardeth Perfect at [facility] from 04/14/2015.  Renal function liver function studies were normal.  Glucose was elevated at 164.  Lipid studies revealed a total cholesterol 121, triglycerides 80, HDL 36, LDL 73.  Hemoglobin A1c was 6.5.  TSH was normal at 2.88.  April lipoprotein B was 71.   RADIOLOGY: No results found.  IMPRESSION:   1. Coronary artery disease due to lipid rich plaque   2. Hyperlipidemia with target LDL less than 70   3. Obstructive sleep apnea   4. Drug therapy   5. Dizziness     ASSESSMENT AND PLAN: Daniel Singleton is a 76 year old gentleman with a history of diabetes mellitus, hyperlipidemia, obstructive sleep apnea  with supplemental oxygen, as well as COPD. He has a prior tobacco history but he quit on 01/17/1999. He is status post CABG surgery x4 in January 2006 and in 2013 in [place] had abnormal nuclear study.  Repeat catheterization demonstrated some mild blockages.  His most recent nuclear study  raised the suspicion for ischemia in the RCA territory.  He had a mild hypotensive response to exercise and had mild ST changes.  He denies any chest pressure.  He had an abnormal stress test in 2013 which led to his repeat cardiac catheterization.  On prior nuclear studies in [place] he has had consistent inferior defect which oftentimes has been interpreted as diaphragmatic attenuation.  Over the past year he has experienced only one short episode of chest pain  on his  dose of Ranexa 1000 g twice a day, Toprol-XL 50 mg, isosorbide 30 mg, in addition to his aspirin,/Plavix.  He has  hyperlipidemia  on Crestor 20 mg in addition to Zetia 10 mg as well as Levoxyl therapy.  On February 10, after waking up, he was confused staggered and was concerned of similar neurologic symptoms that he had experienced in the past.  Yesterday, he had an episode of dizziness, but his blood pressure was stable.  When he checked it.  On exam today he is mildly orthostatic with a 22 mm systolic blood pressure drop from supine to standing.  I am recommending complete set of fasting laboratory be obtained.  If his lipid studies are stable.  It may be possible to discontinue niacin.  I am recommending compression stockings with his orthostasis.  He has not been taking aspirin consistently and I have suggested he increase aspirin 81 mg 2 daily rather than 3 times per week.  His recent symptoms, I am recommending a 2-D echo Doppler study.  I will schedule him for a follow-up Lexiscan Myoview study and will also obtain carotid duplex imaging.  I  have suggested that he contact his neurologist for further evaluation.   He continues to use CPAP therapy for his obstructive sleep apnea.  I will see him back in the office in follow-up of the above studies.  Time spent: 25 minutes  Troy Sine, MD, [facility] 10/13/2016 7:22 PM

## 2016-10-13 NOTE — Patient Instructions (Addendum)
Medication Instructions:   Take the asiprin 81 mg daily.  Labwork:  Fasting  Lab slips have been given to you today.  Testing/Procedures: 1.) Your physician has requested that you have an echocardiogram. Echocardiography is a painless test that uses sound waves to create images of your heart. It provides your doctor with information about the size and shape of your heart and how well your heart's chambers and valves are working. This procedure takes approximately one hour. There are no restrictions for this procedure.  2.) Your physician has requested that you have a carotid duplex. This test is an ultrasound of the carotid arteries in your neck. It looks at blood flow through these arteries that supply the brain with blood. Allow one hour for this exam. There are no restrictions or special instructions.  3.) Your physician has requested that you have a lexiscan myoview. For further information please visit [email or web address]. Please follow instruction sheet, as given.    Follow-Up:  After tests  Dr Claiborne Billings has also recommended that you wear your compression hose.

## 2016-10-15 DIAGNOSIS — I2510 Atherosclerotic heart disease of native coronary artery without angina pectoris: Secondary | ICD-10-CM | POA: Diagnosis not present

## 2016-10-15 DIAGNOSIS — Z79899 Other long term (current) drug therapy: Secondary | ICD-10-CM | POA: Diagnosis not present

## 2016-10-15 DIAGNOSIS — I2583 Coronary atherosclerosis due to lipid rich plaque: Secondary | ICD-10-CM | POA: Diagnosis not present

## 2016-10-15 DIAGNOSIS — E785 Hyperlipidemia, unspecified: Secondary | ICD-10-CM | POA: Diagnosis not present

## 2016-10-15 LAB — COMPREHENSIVE METABOLIC PANEL
ALBUMIN: 4 g/dL (ref 3.6–5.1)
ALK PHOS: 29 U/L — AB (ref 40–115)
ALT: 19 U/L (ref 9–46)
AST: 19 U/L (ref 10–35)
BILIRUBIN TOTAL: 0.7 mg/dL (ref 0.2–1.2)
BUN: 14 mg/dL (ref 7–25)
CALCIUM: 9.1 mg/dL (ref 8.6–10.3)
CO2: 26 mmol/L (ref 20–31)
Chloride: 102 mmol/L (ref 98–110)
Creat: 1 mg/dL (ref 0.70–1.18)
Glucose, Bld: 147 mg/dL — ABNORMAL HIGH (ref 65–99)
Potassium: 4.3 mmol/L (ref 3.5–5.3)
Sodium: 137 mmol/L (ref 135–146)
TOTAL PROTEIN: 6.2 g/dL (ref 6.1–8.1)

## 2016-10-15 LAB — LIPID PANEL
CHOLESTEROL: 98 mg/dL (ref <200)
HDL: 38 mg/dL — AB (ref >40)
LDL Cholesterol: 47 mg/dL (ref <100)
TRIGLYCERIDES: 67 mg/dL (ref <150)
Total CHOL/HDL Ratio: 2.6 Ratio (ref <5.0)
VLDL: 13 mg/dL (ref <30)

## 2016-10-15 LAB — CBC
HEMATOCRIT: 41.7 % (ref 38.5–50.0)
HEMOGLOBIN: 14.1 g/dL (ref 13.2–17.1)
MCH: 31.1 pg (ref 27.0–33.0)
MCHC: 33.8 g/dL (ref 32.0–36.0)
MCV: 91.9 fL (ref 80.0–100.0)
MPV: 10.8 fL (ref 7.5–12.5)
Platelets: 125 10*3/uL — ABNORMAL LOW (ref 140–400)
RBC: 4.54 MIL/uL (ref 4.20–5.80)
RDW: 14.2 % (ref 11.0–15.0)
WBC: 6.3 10*3/uL (ref 3.8–10.8)

## 2016-10-15 LAB — TSH: TSH: 2.98 m[IU]/L (ref 0.40–4.50)

## 2016-10-25 ENCOUNTER — Ambulatory Visit: Payer: Medicare Other | Admitting: Cardiovascular Disease

## 2016-10-27 ENCOUNTER — Telehealth (HOSPITAL_COMMUNITY): Payer: Self-pay

## 2016-10-27 NOTE — Telephone Encounter (Signed)
Encounter complete. 

## 2016-10-28 ENCOUNTER — Other Ambulatory Visit: Payer: Self-pay

## 2016-10-28 ENCOUNTER — Ambulatory Visit (HOSPITAL_COMMUNITY): Payer: Medicare Other | Attending: Internal Medicine

## 2016-10-28 DIAGNOSIS — I2583 Coronary atherosclerosis due to lipid rich plaque: Secondary | ICD-10-CM | POA: Diagnosis not present

## 2016-10-28 DIAGNOSIS — I2510 Atherosclerotic heart disease of native coronary artery without angina pectoris: Secondary | ICD-10-CM | POA: Diagnosis not present

## 2016-10-29 ENCOUNTER — Ambulatory Visit (HOSPITAL_COMMUNITY)
Admission: RE | Admit: 2016-10-29 | Discharge: 2016-10-29 | Disposition: A | Discharge status: Home / Self Care | Payer: Medicare Other | Source: Ambulatory Visit | Attending: Cardiology | Admitting: Cardiology

## 2016-10-29 DIAGNOSIS — R42 Dizziness and giddiness: Secondary | ICD-10-CM | POA: Insufficient documentation

## 2016-10-29 DIAGNOSIS — I6523 Occlusion and stenosis of bilateral carotid arteries: Secondary | ICD-10-CM | POA: Diagnosis not present

## 2016-10-29 DIAGNOSIS — I2583 Coronary atherosclerosis due to lipid rich plaque: Secondary | ICD-10-CM | POA: Diagnosis not present

## 2016-10-29 DIAGNOSIS — I2510 Atherosclerotic heart disease of native coronary artery without angina pectoris: Secondary | ICD-10-CM | POA: Insufficient documentation

## 2016-10-29 LAB — MYOCARDIAL PERFUSION IMAGING
CHL CUP NUCLEAR SDS: 5
CHL CUP RESTING HR STRESS: 67 {beats}/min
CSEPPHR: 80 {beats}/min
LV dias vol: 90 mL (ref 62–150)
LV sys vol: 46 mL
NUC STRESS TID: 1.16
SRS: 2
SSS: 7

## 2016-10-29 MED ORDER — REGADENOSON 0.4 MG/5ML IV SOLN
0.4000 mg | Freq: Once | INTRAVENOUS | Status: AC
  Administered 2016-10-29: 0.4 mg via INTRAVENOUS

## 2016-10-29 MED ORDER — AMINOPHYLLINE 25 MG/ML IV SOLN
75.0000 mg | Freq: Once | INTRAVENOUS | Status: AC
  Administered 2016-10-29: 75 mg via INTRAVENOUS

## 2016-10-29 MED ORDER — TECHNETIUM TC 99M TETROFOSMIN IV KIT
10.4000 | PACK | Freq: Once | INTRAVENOUS | Status: AC | PRN
  Administered 2016-10-29: 10.4 via INTRAVENOUS
  Filled 2016-10-29: qty 11

## 2016-10-29 MED ORDER — TECHNETIUM TC 99M TETROFOSMIN IV KIT
31.7000 | PACK | Freq: Once | INTRAVENOUS | Status: AC | PRN
  Administered 2016-10-29: 31.7 via INTRAVENOUS
  Filled 2016-10-29: qty 32

## 2016-11-10 ENCOUNTER — Ambulatory Visit (INDEPENDENT_AMBULATORY_CARE_PROVIDER_SITE_OTHER): Payer: Medicare Other | Admitting: Cardiovascular Disease

## 2016-11-10 VITALS — BP 125/65 | HR 70 | Ht 70.0 in | Wt 197.8 lb

## 2016-11-10 DIAGNOSIS — J449 Chronic obstructive pulmonary disease, unspecified: Secondary | ICD-10-CM

## 2016-11-10 DIAGNOSIS — R42 Dizziness and giddiness: Secondary | ICD-10-CM | POA: Diagnosis not present

## 2016-11-10 DIAGNOSIS — I2510 Atherosclerotic heart disease of native coronary artery without angina pectoris: Secondary | ICD-10-CM | POA: Diagnosis not present

## 2016-11-10 DIAGNOSIS — E785 Hyperlipidemia, unspecified: Secondary | ICD-10-CM | POA: Diagnosis not present

## 2016-11-10 DIAGNOSIS — G4733 Obstructive sleep apnea (adult) (pediatric): Secondary | ICD-10-CM | POA: Diagnosis not present

## 2016-11-10 DIAGNOSIS — I951 Orthostatic hypotension: Secondary | ICD-10-CM

## 2016-11-10 DIAGNOSIS — I2583 Coronary atherosclerosis due to lipid rich plaque: Secondary | ICD-10-CM | POA: Diagnosis not present

## 2016-11-10 MED ORDER — OMEGA-3-ACID ETHYL ESTERS 1 G PO CAPS: 1.0000 | ORAL_CAPSULE | Freq: Every day | ORAL | 3 refills | Status: DC

## 2016-11-10 MED ORDER — MIDODRINE HCL 2.5 MG PO TABS: 2.5000 mg | ORAL_TABLET | Freq: Two times a day (BID) | ORAL | 0 refills | Status: DC

## 2016-11-10 MED ORDER — MIDODRINE HCL 2.5 MG PO TABS: 2.5000 mg | ORAL_TABLET | Freq: Two times a day (BID) | ORAL | 3 refills | Status: DC

## 2016-11-10 MED ORDER — CLOPIDOGREL BISULFATE 75 MG PO TABS: 75.0000 mg | ORAL_TABLET | Freq: Every day | ORAL | 3 refills | Status: DC

## 2016-11-10 MED ORDER — METOPROLOL SUCCINATE ER 25 MG PO TB24: 25.0000 mg | ORAL_TABLET | Freq: Every day | ORAL | 3 refills | Status: DC

## 2016-11-10 MED ORDER — EZETIMIBE 10 MG PO TABS: 10.0000 mg | ORAL_TABLET | Freq: Every morning | ORAL | 3 refills | Status: DC

## 2016-11-10 MED ORDER — ROSUVASTATIN CALCIUM 20 MG PO TABS: 20.0000 mg | ORAL_TABLET | Freq: Every day | ORAL | 3 refills | Status: DC

## 2016-11-10 MED ORDER — RANOLAZINE ER 1000 MG PO TB12: 1000.0000 mg | ORAL_TABLET | Freq: Two times a day (BID) | ORAL | 3 refills | Status: DC

## 2016-11-10 MED ORDER — ISOSORBIDE MONONITRATE ER 30 MG PO TB24: 15.0000 mg | ORAL_TABLET | Freq: Every day | ORAL | 2 refills | Status: DC

## 2016-11-10 NOTE — Patient Instructions (Signed)
Your physician has recommended you make the following change in your medication:   1.) start the new prescription sent in today for midodrine 2.5 mg as directed on the bottle.  2.) decrease the isosorbide from 1 tablet (30 mg ) to 15 mg (1/2) tablet daily for 1 week. If you notice no change in your chest discomfort then STOP.  Your physician recommends that you schedule a follow-up appointment in: 6 weeks with Dr Claiborne Billings.

## 2016-11-10 NOTE — Progress Notes (Signed)
Patient ID: Daniel Singleton, male   DOB: 1941-06-30, 76 y.o.   MRN: [id number]    Primary M.D.: Dr. Jose Persia  HPI:  Mr. Randy Castrejon is a 76 year old gentleman who presents for a one month follow-up cardiology evaluation.  Mr. Okray has a history of hypertension, hyperlipidemia, COPD, prior asbestos exposure, diabetes mellitus , and  prostate cancer. In January 2006, he underwent CABG surgery x4  in [facility] by Dr. Zorita Pang. I do not have the specifics of his bypass grafts. He had stress test in [place] in 2013 after he had experienced some recurrent chest pain episodes and a cardiac catheterization which showed mild blockages.  He also has a history of obstructive sleep apnea on CPAP therapy with 3 L of oxygen and is followed by Dr. Keturah Barre.  Mr. Barmore admits to mild shortness of breath.  He has a history of hyperlipidemia.   In the past he had been on isosorbide mononitrate therapy but stopped taking this because of libido concerns and potential need for medications for erectile function.  An echo Doppler study on 04/03/2013 showed an ejection fraction of 55-60%; mild tricuspid regurgitation, and grade 1 diastolic dysfunction. When I saw him, I recommended that we perform advanced lipid testing with NMR lipoprotein of since he has had low HDL levels and her recent cholesterol was 185 triglycerides 153 HDL 31 and LDL 123. At that time, I also started him on Ranexa 500 mg twice a day since he had discontinued his isosorbide. I added Zetia 10 mg to his atorvastatin and recommended that he undergo a followup with NMR lipoprotein assessment.  His NMR study was markedly abnormal. Specifically, his total cholesterol was 187 triglycerides 348 HDL 32 LDL cholesterol 85. However, he had markedly increased goal LDL particles at 1739 status LDL particle number was markedly increased at 2463. The patient tells me he did develop some myalgias on Lipitor and therefore for approximately the  last 3-4 weeks completely discontinued a torus that in therapy.  He was  hospitalized on December 26 through 08/21/2013 with a partial small bowel obstruction. He had experienced nausea and vomiting with diarrhea for 4 days prior to his admission and was found to have a partial mid-to high grade small bowel obstruction with a transition point in the right lower quadrant. He was cleared for surgery and if surgery was to be done he was to stop his Plavix and continue aspirin. Fortunately, his bowel obstruction resolved with medical management and surgical intervention was not necessary.  I'd seen him in January 2015 at which time he was off Plavix therapy and was not having any anginal symptoms on his current medical regimen.  He was hospitalized on 03/19/2014 through 03/21/2014 with ataxia.  He had complaints of vertigo with nausea, vomiting, double vision, numbness, and weakness.  A CT of the brain did not show acute intracranial abnormalities, but did show chronic atrophy and small vessel ischemic changes.  A 2-D echo Doppler study showed an EF of 45-50% without cardiac source of emboli.  Carotid studies revealed mild bilateral plaque with narrowing of 1-39% range.  Antegrade flow is vertebral artery.  Doppler of the brachial artery demonstrate triphasic waveforms.  He was felt possibly to have a posterior circulation TIA.  His aspirin 81 mg was increased to 325 mg.  and I last saw him in August 2015 due to his recent TIA.  I recommended reinitiation of Plavix 75 mg daily and reduced his aspirin dose back  to 81 mg.  When I saw several months ago he denied any significant chest pain but had experienced one short episode in February that was nonexertional.  However, he had noticed increasing exertional shortness of breath with activity.  He had stopped using his CPAP therapy and complained of increased fatigue.  He had not seen Dr. Annamaria Boots in some time and I recommended that when he saw him in July that  consideration for follow-up sleep study.  He at home.  At that time, he was planning to undergo cataract surgery sometime this fall.  Due to his exertional shortness of breath and his established coronary artery disease.  I recommended a nuclear perfusion study.  This was done on 02/28/2015 and was intermediate risk.  He developed mild ST segment depression and had occasional PACs and PVCs.  Scintigraphic images revealed a medium defect of moderate severity in the RCA distribution.  He also had an abnormal blood pressure response with hypotension during stress.  I was able to obtain the records from [place] to see if he had any significant inferior defect on prior nuclear studies.  Review his records dating back to 2006 including catheterization studies and subsequent nuclear imaging studies.  On the majority of his nuclear studies dating from 2007.  He was found to have mild fixed basal posterior lateral defect and there was some concern of possible attenuation artifact.  He was not found to have significant ischemia.  A nuclear study in 2011 was interpreted as indeterminate for ischemia with motion artifact.  His last study in [place] was in 2013 which again showed a fixed inferior defect and was felt that he had a low probability of ischemia.   He had seen a neurologist for evaluation of confusion, and staggering gait.  He also had an episode of dizziness yesterday and when he checked his blood pressure.  This was stable at 118/67 with a heart rate at 74.  Since I last saw him, he tells me he has not been using his CPAP for some time.  He dotes some  weakness when he stands abruptly.  He denies any recent chest pain.  He is unaware of palpitations.  He underwent an echo Doppler study on 10/28/2016 which showed an EF 55-60%.  There were no wall motion abnormalities.  There was grade 1 diastolic dysfunction.  PA pressure estimate was 35 mm.  On 10/29/2016, he underwent a nuclear stress test where his EF was  reported at 49%.  No ischemia was noted.  He also underwent carotid duplex imaging.  On 10/29/2016 which showed heterogeneous plaque bilaterally with 1-39%, mild bilateral carotid stenoses, not felt to be significant.  He had normal subclavian arteries, and patent vertebral arteries with antegrade flow.  He presents for evaluation.  Past Medical History:  Diagnosis Date  . Angina at rest [facility])   . Anxiety   . Arthritis   . Asthma   . Coronary artery disease    recently started seeing Dr. Claiborne Billings   . Depression   . Diabetes ([place])   . Elevated PSA   . Emphysema   . GERD (gastroesophageal reflux disease)   . Hx of radiation therapy 09/20/13- 11/14/13   prostate 7800 cGy 40 sessions, seminal vesicles 5600 cGy in 40 sessions  . Hypercholesteremia   . Hypertension   . PONV (postoperative nausea and vomiting)   . Prostate cancer ([place]) 05/23/13   gleason 3+4=7, volume 31 cc  . Sleep apnea    c pap  with oxygen at 2.5 L    Past Surgical History:  Procedure Laterality Date  . BACK SURGERY  2006   discectomy lumbar  . BACK SURGERY    . BACK SURGERY    . degloving rt leg injury  2000   had skin grafts  . FOOT SURGERY    . heart bypass  2006   [facility]  . HERNIA REPAIR  2008   x 3 w/mesh, bilat inguinla, umbilical  . PROSTATE BIOPSY N/A 05/23/2013   Procedure: BIOPSY TRANSRECTAL ULTRASONIC PROSTATE (TUBP);  Surgeon: Molli Hazard, MD;  Location: [facility];  Service: Urology;  Laterality: N/A;  prostate nerve block  . TONSILLECTOMY     w/adenoidectomy    Allergies  Allergen Reactions  . Niacin And Related Itching    Reaction to Rx strength only, OTC is okay    Current Outpatient Prescriptions  Medication Sig Dispense Refill  . albuterol (PROVENTIL HFA;VENTOLIN HFA) 108 (90 Base) MCG/ACT inhaler Inhale 2 puffs into the lungs every 6 (six) hours as needed for wheezing. 3 Inhaler 3  . aspirin EC 81 MG tablet Take 1 tablet (81 mg total) by mouth daily. 90 tablet 3  . clopidogrel  (PLAVIX) 75 MG tablet Take 1 tablet (75 mg total) by mouth daily. 90 tablet 3  . DULoxetine (CYMBALTA) 60 MG capsule Take 60 mg by mouth daily.    Marland Kitchen ezetimibe (ZETIA) 10 MG tablet Take 1 tablet (10 mg total) by mouth every morning. 90 tablet 3  . fluticasone furoate-vilanterol (BREO ELLIPTA) 100-25 MCG/INH AEPB Inhale 1 puff then rinse mouth, once daily 3 each 3  . Ibuprofen-Diphenhydramine Cit ([employer] IBUPROFEN PM) 200-38 MG TABS Take by mouth. As needed    . isosorbide mononitrate (IMDUR) 30 MG 24 hr tablet Take 0.5 tablets (15 mg total) by mouth daily. 90 tablet 2  . metoprolol succinate (TOPROL-XL) 25 MG 24 hr tablet Take 1 tablet (25 mg total) by mouth daily. 90 tablet 3  . niacin 500 MG tablet Take 500 mg by mouth daily. OTC    . nitroGLYCERIN (NITROSTAT) 0.3 MG SL tablet Place 0.3 mg under the tongue every 5 (five) minutes as needed for chest pain.    . NON FORMULARY Take 2 capsules by mouth daily. Glucocil    . omega-3 acid ethyl esters (LOVAZA) 1 g capsule Take 1 capsule (1 g total) by mouth daily. 90 capsule 3  . ranolazine (RANEXA) 1000 MG SR tablet Take 1 tablet (1,000 mg total) by mouth 2 (two) times daily. 180 tablet 3  . rosuvastatin (CRESTOR) 20 MG tablet Take 1 tablet (20 mg total) by mouth daily. 90 tablet 3  . midodrine (PROAMATINE) 2.5 MG tablet Take 1 tablet (2.5 mg total) by mouth 2 (two) times daily with a meal. 60 tablet 0   No current facility-administered medications for this visit.     Social history is notable in that he is married to his 2 children ages 61 and 22. He smoked one pack of cigarettes per day until 2000. He does drink occasional alcohol. He does walk  Family History  Problem Relation Age of Onset  . Heart disease Mother   . Heart attack Mother   . Cancer Sister   . Depression Sister    ROS General: Negative; No fevers, chills, or night sweats;  HEENT: Negative; No changes in vision or hearing, sinus congestion, difficulty swallowing Pulmonary:  Positive for asbestosis exposure No cough, wheezing, shortness of breath, hemoptysis Cardiovascular: Negative; No  chest pain, presyncope, syncope, palpitations GI: Negative; No nausea, vomiting, diarrhea, or abdominal pain GU: Positive for prostate CA No dysuria, hematuria, or difficulty voiding Musculoskeletal: Remote degloving injury; no myalgias, joint pain, or weakness Hematologic/Oncology: Negative; no easy bruising, bleeding Endocrine: Negative; no heat/cold intolerance; no diabetes Neuro: See history of present illness, recent ataxia, probable posterior circulation TIA Skin: Negative; No rashes or skin lesions Psychiatric: Negative; No behavioral problems, depression Sleep: Positive for obstructive sleep apnea with supplemental 3 L oxygen; he stopped using CPAP, apparently due to difficulties with his mask and potential mask leak; since he stopped using CPAP he notes more fatigue, daytime sleepiness,; no bruxism, restless legs, hypnogognic hallucinations, no cataplexy Other comprehensive 14 point system review is negative.   PE BP 125/65   Pulse 70   Ht 5' 10"  (1.778 m)   Wt 197 lb 12.8 oz (89.7 kg)   BMI 28.38 kg/m    Repeat blood pressure by me was 114/70, supine and 88/62 standing.  Wt Readings from Last 3 Encounters:  11/10/16 197 lb 12.8 oz (89.7 kg)  10/29/16 196 lb (88.9 kg)  10/13/16 196 lb 9.6 oz (89.2 kg)   General: Alert, oriented, no distress.  Skin: normal turgor, no rashes HEENT: Normocephalic, atraumatic. Pupils round and reactive; sclera anicteric; Fundi no hemorrhages or exudates Nose without nasal septal hypertrophy Mouth/Parynx benign; Mallinpatti scale 3 Neck: No JVD, no carotid bruits Lungs: clear to ausculatation and percussion; no wheezing or rales Chest wall: No tenderness to palpation Heart: RRR, s1 s2 normal 1/6 systolic murmur; no diastolic murmur.  No rubs, thrills or heaves Abdomen: Mild diastases recti; soft, nontender; no hepatosplenomehaly,  BS+; abdominal aorta nontender and not dilated by palpation. Back: No CVA tenderness. Pulses 2+ Extremities: no clubbinbg cyanosis or edema, Homan's sign negative  Neurologic: grossly nonfocal Psychological: Normal affect and mood; normal cognition  ECG (independently read by me): Normal sinus rhythm at 68 bpm, mild sinus arrhythmia.  Normal intervals.  No significant ST changes.  February 2018 ECG (independently read by me): Normal sinus rhythm with mild sinus arrhythmia.  Heart rate 69.  PAC.  No significant ST changes.  Normal intervals.  December 2016 ECG (independently read by me): Normal sinus rhythm at 71 bpm with  PACs.  QTc interval normal at 439.  June 2016 ECG (independently read by me): Sinus rhythm with sinus arrhythmia, heart rate averaging 68 bpm.  Intervals normal.  December 2015 ECG (independently read by me): Normal sinus rhythm with mild sinus arrhythmia and PACs with ventricular rate at 76 bpm.  QTc interval 427 ms.  No significant ST segment changes.  August 2015 ECG (independently read by me): Sinus rhythm with sinus arrhythmia.  Nonspecific T changes.  QTc interval 421 ms.  08/30/2013 ECG independently read by me today shows normal sinus rhythm with sinus arrhythmia with a ventricular rate in the 80s. They're nonspecific ST-T changes.  Prior ECG in October 2014: Sinus rhythm with mild sinus arrhythmia at 60 beats per minute. PR interval 172 ms. QTC interval 384 ms.  LABS: BMP Latest Ref Rng & Units 10/15/2016 02/17/2015 03/20/2014  Glucose 65 - 99 mg/dL 147(H) 151(H) 152(H)  BUN 7 - 25 mg/dL 14 16 11   Creatinine 0.70 - 1.18 mg/dL 1.00 1.03 0.90  Sodium 135 - 146 mmol/L 137 140 139  Potassium 3.5 - 5.3 mmol/L 4.3 4.4 4.1  Chloride 98 - 110 mmol/L 102 101 99  CO2 20 - 31 mmol/L 26 28 24   Calcium 8.6 -  10.3 mg/dL 9.1 9.3 8.9   Hepatic Function Latest Ref Rng & Units 10/15/2016 02/17/2015 03/20/2014  Total Protein 6.1 - 8.1 g/dL 6.2 6.3 6.2  Albumin 3.6 - 5.1 g/dL 4.0  4.1 3.5  AST 10 - 35 U/L 19 18 19   ALT 9 - 46 U/L 19 16 18   Alk Phosphatase 40 - 115 U/L 29(L) 31(L) 46  Total Bilirubin 0.2 - 1.2 mg/dL 0.7 0.6 0.3   CBC Latest Ref Rng & Units 10/15/2016 02/17/2015 03/20/2014  WBC 3.8 - 10.8 K/uL 6.3 5.3 8.0  Hemoglobin 13.2 - 17.1 g/dL 14.1 14.1 13.2  Hematocrit 38.5 - 50.0 % 41.7 41.2 38.8(L)  Platelets 140 - 400 K/uL 125(L) 137(L) 130(L)   Lab Results  Component Value Date   MCV 91.9 10/15/2016   MCV 89.8 02/17/2015   MCV 88.2 03/20/2014   Lab Results  Component Value Date   TSH 2.98 10/15/2016   Lab Results  Component Value Date   HGBA1C 6.5 (H) 03/20/2014   Lipid Panel     Component Value Date/Time   CHOL 98 10/15/2016 0858   CHOL 118 08/01/2013 0952   TRIG 67 10/15/2016 0858   TRIG 96 08/01/2013 0952   HDL 38 (L) 10/15/2016 0858   HDL 46 08/01/2013 0952   CHOLHDL 2.6 10/15/2016 0858   VLDL 13 10/15/2016 0858   LDLCALC 47 10/15/2016 0858   LDLCALC 53 08/01/2013 0952    Blood work done by Dr[facility] Ardeth Perfect at [facility] from 04/14/2015.  Renal function liver function studies were normal.  Glucose was elevated at 164.  Lipid studies revealed a total cholesterol 121, triglycerides 80, HDL 36, LDL 73.  Hemoglobin A1c was 6.5.  TSH was normal at 2.88.  April lipoprotein B was 71.   RADIOLOGY: No results found.  IMPRESSION:   1. Coronary artery disease due to lipid rich plaque   2. Hyperlipidemia with target LDL less than 70   3. Dizziness   4. Orthostatic hypotension   5. Obstructive sleep apnea   6. Chronic obstructive pulmonary disease, unspecified COPD type ([place])     ASSESSMENT AND PLAN: Mr. Mithcell Schumpert is a 76 year old gentleman with a history of diabetes mellitus, hyperlipidemia, obstructive sleep apnea  with supplemental oxygen, as well as COPD. He has a prior tobacco history but he quit on 01/17/1999. He is status post CABG surgery x4 in January 2006 and in 2013 in [place] had abnormal nuclear study.  Repeat  catheterization demonstrated some mild blockages.  A prior nuclear study  raised the suspicion for ischemia in the RCA territory.  He had a mild hypotensive response to exercise and had mild ST changes.  He denies any chest pressure.  He had an abnormal stress test in 2013 which led to his repeat cardiac catheterization.  On prior nuclear studies in [place] he has had consistent inferior defect which oftentimes has been interpreted as diaphragmatic attenuation.  Over the past year he has experienced only one short episode of chest pain  on his  dose of Ranexa 1039m twice a day, Toprol-XL 50 mg, isosorbide 30 mg, in addition to his aspirin,/Plavix.  He has hyperlipidemia  on Crestor 20 mg in addition to Zetia 10 mg as well as Levoxyl therapy.  He did recently developed neurologic symptoms with confusion and staggered gait.  He is orthostatic on exam today.  I reviewed his echo Doppler study, which shows normal systolic function.  His nuclear study showed reduced EF at 49% without ischemia.  His carotid duplex imaging did not reveal significant carotid stenosis and he had normal vertebral flow.  With his significant orthostatic hypotension, I am recommending reduction and ultimate discontinuance of isosorbide.  He will reduce this to 15 mg for one week and then discontinue, along is not having recurrent anginal symptomatology.  I'm electing to initiate Midrin at 2.5 mg initially to twice a day and depending upon tolerability.  This pain need to be increased to 3 times per day.  He will continue with Zetia and simvastatin force hyperlipidemia with target LDL less than 70.  He continues to be on aspirin and Plavix 81 mg.  There is no bleeding.  I will also recommended compression stockings to both legs with at least a 20-30 mm support.  I have strongly recommended he resume CPAP therapy and recommended he follow-up with Dr. Annamaria Boots who he thought had retired but has not actually retired.  I will see him in 6-8 weeks  for follow-up evaluation.  Time spent: 25 minutes  Troy Sine, MD, [facility] 11/16/2016 4:49 PM

## 2016-11-16 ENCOUNTER — Encounter: Payer: Self-pay | Admitting: Cardiovascular Disease

## 2016-12-06 DIAGNOSIS — I10 Essential (primary) hypertension: Secondary | ICD-10-CM | POA: Diagnosis not present

## 2016-12-06 DIAGNOSIS — Z6829 Body mass index (BMI) 29.0-29.9, adult: Secondary | ICD-10-CM | POA: Diagnosis not present

## 2016-12-06 DIAGNOSIS — I951 Orthostatic hypotension: Secondary | ICD-10-CM | POA: Diagnosis not present

## 2016-12-06 DIAGNOSIS — E1136 Type 2 diabetes mellitus with diabetic cataract: Secondary | ICD-10-CM | POA: Diagnosis not present

## 2016-12-06 DIAGNOSIS — E784 Other hyperlipidemia: Secondary | ICD-10-CM | POA: Diagnosis not present

## 2016-12-23 ENCOUNTER — Ambulatory Visit (INDEPENDENT_AMBULATORY_CARE_PROVIDER_SITE_OTHER): Payer: Medicare Other | Admitting: Cardiovascular Disease

## 2016-12-23 ENCOUNTER — Encounter: Payer: Self-pay | Admitting: Cardiovascular Disease

## 2016-12-23 VITALS — BP 134/70 | HR 69 | Ht 70.0 in | Wt 201.6 lb

## 2016-12-23 DIAGNOSIS — J449 Chronic obstructive pulmonary disease, unspecified: Secondary | ICD-10-CM | POA: Diagnosis not present

## 2016-12-23 DIAGNOSIS — I2583 Coronary atherosclerosis due to lipid rich plaque: Secondary | ICD-10-CM | POA: Diagnosis not present

## 2016-12-23 DIAGNOSIS — G4733 Obstructive sleep apnea (adult) (pediatric): Secondary | ICD-10-CM

## 2016-12-23 DIAGNOSIS — I951 Orthostatic hypotension: Secondary | ICD-10-CM

## 2016-12-23 DIAGNOSIS — I2510 Atherosclerotic heart disease of native coronary artery without angina pectoris: Secondary | ICD-10-CM

## 2016-12-23 DIAGNOSIS — E785 Hyperlipidemia, unspecified: Secondary | ICD-10-CM | POA: Diagnosis not present

## 2016-12-23 NOTE — Progress Notes (Signed)
Patient ID: JAYRO MCMATH, male   DOB: May 25, 1941, 76 y.o.   MRN: [id number]    Primary M.D.: Dr. Jose Persia  HPI:  Mr. Larron Armor is a 76 year old gentleman who presents for a 6 week follow-up cardiology evaluation.  Mr. Helfand has a history of hypertension, hyperlipidemia, COPD, prior asbestos exposure, diabetes mellitus , and  prostate cancer. In January 2006, he underwent CABG surgery x4  in [facility] by Dr. Zorita Pang. I do not have the specifics of his bypass grafts. He had stress test in [place] in 2013 after he had experienced some recurrent chest pain episodes and a cardiac catheterization which showed mild blockages.  He also has a history of obstructive sleep apnea on CPAP therapy with 3 L of oxygen and is followed by Dr. Keturah Barre.  Mr. Manninen admits to mild shortness of breath.  He has a history of hyperlipidemia.   In the past he had been on isosorbide mononitrate therapy but stopped taking this because of libido concerns and potential need for medications for erectile function.  An echo Doppler study on 04/03/2013 showed an ejection fraction of 55-60%; mild tricuspid regurgitation, and grade 1 diastolic dysfunction. When I saw him, I recommended that we perform advanced lipid testing with NMR lipoprotein of since he has had low HDL levels and her recent cholesterol was 185 triglycerides 153 HDL 31 and LDL 123. At that time, I also started him on Ranexa 500 mg twice a day since he had discontinued his isosorbide. I added Zetia 10 mg to his atorvastatin and recommended that he undergo a followup with NMR lipoprotein assessment.  His NMR study was markedly abnormal. Specifically, his total cholesterol was 187 triglycerides 348 HDL 32 LDL cholesterol 85. However, he had markedly increased goal LDL particles at 1739 status LDL particle number was markedly increased at 2463. The patient tells me he did develop some myalgias on Lipitor and therefore for approximately the last  3-4 weeks completely discontinued a torus that in therapy.  He was  hospitalized on December 26 through 08/21/2013 with a partial small bowel obstruction. He had experienced nausea and vomiting with diarrhea for 4 days prior to his admission and was found to have a partial mid-to high grade small bowel obstruction with a transition point in the right lower quadrant. He was cleared for surgery and if surgery was to be done he was to stop his Plavix and continue aspirin. Fortunately, his bowel obstruction resolved with medical management and surgical intervention was not necessary.  I'd seen him in January 2015 at which time he was off Plavix therapy and was not having any anginal symptoms on his current medical regimen.  He was hospitalized on 03/19/2014 through 03/21/2014 with ataxia.  He had complaints of vertigo with nausea, vomiting, double vision, numbness, and weakness.  A CT of the brain did not show acute intracranial abnormalities, but did show chronic atrophy and small vessel ischemic changes.  A 2-D echo Doppler study showed an EF of 45-50% without cardiac source of emboli.  Carotid studies revealed mild bilateral plaque with narrowing of 1-39% range.  Antegrade flow is vertebral artery.  Doppler of the brachial artery demonstrate triphasic waveforms.  He was felt possibly to have a posterior circulation TIA.  His aspirin 81 mg was increased to 325 mg.  and I last saw him in August 2015 due to his recent TIA.  I recommended reinitiation of Plavix 75 mg daily and reduced his aspirin dose back  to 81 mg.  When I saw several months ago he denied any significant chest pain but had experienced one short episode in February that was nonexertional.  However, he had noticed increasing exertional shortness of breath with activity.  He had stopped using his CPAP therapy and complained of increased fatigue.  He had not seen Dr. Annamaria Boots in some time and I recommended that when he saw him in July that consideration  for follow-up sleep study.  He at home.  At that time, he was planning to undergo cataract surgery sometime this fall.  Due to his exertional shortness of breath and his established coronary artery disease.  I recommended a nuclear perfusion study.  This was done on 02/28/2015 and was intermediate risk.  He developed mild ST segment depression and had occasional PACs and PVCs.  Scintigraphic images revealed a medium defect of moderate severity in the RCA distribution.  He also had an abnormal blood pressure response with hypotension during stress.  I was able to obtain the records from [place] to see if he had any significant inferior defect on prior nuclear studies.  Review his records dating back to 2006 including catheterization studies and subsequent nuclear imaging studies.  On the majority of his nuclear studies dating from 2007.  He was found to have mild fixed basal posterior lateral defect and there was some concern of possible attenuation artifact.  He was not found to have significant ischemia.  A nuclear study in 2011 was interpreted as indeterminate for ischemia with motion artifact.  His last study in [place] was in 2013 which again showed a fixed inferior defect and was felt that he had a low probability of ischemia.   He had seen a neurologist for evaluation of confusion, and staggering gait.  He also had an episode of dizziness yesterday and when he checked his blood pressure.  This was stable at 118/67 with a heart rate at 74.  Since I last saw him, he tells me he has not been using his CPAP for some time.  He dotes some  weakness when he stands abruptly.  He denies any recent chest pain.  He is unaware of palpitations.  He underwent an echo Doppler study on 10/28/2016 which showed an EF 55-60%.  There were no wall motion abnormalities.  There was grade 1 diastolic dysfunction.  PA pressure estimate was 35 mm.  On 10/29/2016, he underwent a nuclear stress test where his EF was reported at  49%.  No ischemia was noted.  He also underwent carotid duplex imaging.  On 10/29/2016 which showed heterogeneous plaque bilaterally with 1-39%, mild bilateral carotid stenoses, not felt to be significant.  He had normal subclavian arteries, and patent vertebral arteries with antegrade flow.    When I last saw him, he had significant orthostatic hypotension and his blood pressure dropped from 114/70 supine to 88/62 standing.  I reduced and ultimately discontinued isosorbide.  Off this therapy, he has not had any recurrent anginal symptomatology.  I started him on Midodrin at 2.5 mg initially twice a day.  I recommended compression stockings to both legs with at least 20-30 mm of support.  With this adjustment, he has felt well.  He is no longer dizzy.  His lower extremity edema has improved.  He presents for reevaluation.    Past Medical History:  Diagnosis Date  . Angina at rest [facility])   . Anxiety   . Arthritis   . Asthma   . Coronary artery disease  recently started seeing Dr. Claiborne Billings   . Depression   . Diabetes ([place])   . Elevated PSA   . Emphysema   . GERD (gastroesophageal reflux disease)   . Hx of radiation therapy 09/20/13- 11/14/13   prostate 7800 cGy 40 sessions, seminal vesicles 5600 cGy in 40 sessions  . Hypercholesteremia   . Hypertension   . PONV (postoperative nausea and vomiting)   . Prostate cancer ([place]) 05/23/13   gleason 3+4=7, volume 31 cc  . Sleep apnea    c pap with oxygen at 2.5 L    Past Surgical History:  Procedure Laterality Date  . BACK SURGERY  2006   discectomy lumbar  . BACK SURGERY    . BACK SURGERY    . degloving rt leg injury  2000   had skin grafts  . FOOT SURGERY    . heart bypass  2006   [facility]ad  . HERNIA REPAIR  2008   x 3 w/mesh, bilat inguinla, umbilical  . PROSTATE BIOPSY N/A 05/23/2013   Procedure: BIOPSY TRANSRECTAL ULTRASONIC PROSTATE (TUBP);  Surgeon: Molli Hazard, MD;  Location: [facility];  Service: Urology;  Laterality: N/A;   prostate nerve block  . TONSILLECTOMY     w/adenoidectomy    Allergies  Allergen Reactions  . Niacin And Related Itching    Reaction to Rx strength only, OTC is okay    Current Outpatient Prescriptions  Medication Sig Dispense Refill  . albuterol (PROVENTIL HFA;VENTOLIN HFA) 108 (90 Base) MCG/ACT inhaler Inhale 2 puffs into the lungs every 6 (six) hours as needed for wheezing. 3 Inhaler 3  . aspirin EC 81 MG tablet Take 1 tablet (81 mg total) by mouth daily. 90 tablet 3  . clopidogrel (PLAVIX) 75 MG tablet Take 1 tablet (75 mg total) by mouth daily. 90 tablet 3  . DULoxetine (CYMBALTA) 60 MG capsule Take 60 mg by mouth daily.    Marland Kitchen ezetimibe (ZETIA) 10 MG tablet Take 1 tablet (10 mg total) by mouth every morning. 90 tablet 3  . fluticasone furoate-vilanterol (BREO ELLIPTA) 100-25 MCG/INH AEPB Inhale 1 puff then rinse mouth, once daily 3 each 3  . Ibuprofen-Diphenhydramine Cit ([employer] IBUPROFEN PM) 200-38 MG TABS Take by mouth. As needed    . metoprolol succinate (TOPROL-XL) 25 MG 24 hr tablet Take 1 tablet (25 mg total) by mouth daily. 90 tablet 3  . midodrine (PROAMATINE) 2.5 MG tablet Take 1 tablet (2.5 mg total) by mouth 2 (two) times daily with a meal. 60 tablet 0  . niacin 500 MG tablet Take 500 mg by mouth daily. OTC    . nitroGLYCERIN (NITROSTAT) 0.3 MG SL tablet Place 0.3 mg under the tongue every 5 (five) minutes as needed for chest pain.    . NON FORMULARY Take 2 capsules by mouth daily. Glucocil    . omega-3 acid ethyl esters (LOVAZA) 1 g capsule Take 1 capsule (1 g total) by mouth daily. 90 capsule 3  . ranolazine (RANEXA) 1000 MG SR tablet Take 1 tablet (1,000 mg total) by mouth 2 (two) times daily. 180 tablet 3  . rosuvastatin (CRESTOR) 20 MG tablet Take 1 tablet (20 mg total) by mouth daily. 90 tablet 3   No current facility-administered medications for this visit.     Social history is notable in that he is married to his 2 children ages 67 and 24. He smoked one pack of  cigarettes per day until 2000. He does drink occasional alcohol. He does walk  Family History  Problem Relation Age of Onset  . Heart disease Mother   . Heart attack Mother   . Cancer Sister   . Depression Sister    ROS General: Negative; No fevers, chills, or night sweats;  HEENT: Negative; No changes in vision or hearing, sinus congestion, difficulty swallowing Pulmonary: Positive for asbestosis exposure No cough, wheezing, shortness of breath, hemoptysis Cardiovascular: Negative; No chest pain, presyncope, syncope, palpitations GI: Negative; No nausea, vomiting, diarrhea, or abdominal pain GU: Positive for prostate CA No dysuria, hematuria, or difficulty voiding Musculoskeletal: Remote degloving injury; no myalgias, joint pain, or weakness Hematologic/Oncology: Negative; no easy bruising, bleeding Endocrine: Negative; no heat/cold intolerance; no diabetes Neuro: See history of present illness, recent ataxia, probable posterior circulation TIA Skin: Negative; No rashes or skin lesions Psychiatric: Negative; No behavioral problems, depression Sleep: Positive for obstructive sleep apnea with supplemental 3 L oxygen; he stopped using CPAP, apparently due to difficulties with his mask and potential mask leak; since he stopped using CPAP he notes more fatigue, daytime sleepiness,; no bruxism, restless legs, hypnogognic hallucinations, no cataplexy Other comprehensive 14 point system review is negative.   PE BP 134/70   Pulse 69   Ht 5' 10"  (1.778 m)   Wt 201 lb 9.6 oz (91.4 kg)   BMI 28.93 kg/m    Repeat blood pressure by me was 122/74 supine and 120/74 standing  Wt Readings from Last 3 Encounters:  12/23/16 201 lb 9.6 oz (91.4 kg)  11/10/16 197 lb 12.8 oz (89.7 kg)  10/29/16 196 lb (88.9 kg)   General: Alert, oriented, no distress.  Skin: normal turgor, no rashes HEENT: Normocephalic, atraumatic. Pupils round and reactive; sclera anicteric; Fundi no hemorrhages or  exudates Nose without nasal septal hypertrophy Mouth/Parynx benign; Mallinpatti scale 3 Neck: No JVD, no carotid bruits Lungs: clear to ausculatation and percussion; no wheezing or rales Chest wall: No tenderness to palpation Heart: RRR, s1 s2 normal 1/6 systolic murmur; no diastolic murmur.  No rubs, thrills or heaves Abdomen: Mild diastases recti; soft, nontender; no hepatosplenomehaly, BS+; abdominal aorta nontender and not dilated by palpation. Back: No CVA tenderness. Pulses 2+ Extremities: no clubbinbg cyanosis or edema, Homan's sign negative .  He was wearing compression socks but today only had one sock on his right leg and did not put the other sock on his left leg Neurologic: grossly nonfocal Psychological: Normal affect and mood; normal cognition.  ECG (independently read by me): Sinus rhythm with occasional PVC.  PR interval 182 ms.  QTc interval 435 ms.  March 2018 ECG (independently read by me): Normal sinus rhythm at 68 bpm, mild sinus arrhythmia.  Normal intervals.  No significant ST changes.  February 2018 ECG (independently read by me): Normal sinus rhythm with mild sinus arrhythmia.  Heart rate 69.  PAC.  No significant ST changes.  Normal intervals.  December 2016 ECG (independently read by me): Normal sinus rhythm at 71 bpm with  PACs.  QTc interval normal at 439.  June 2016 ECG (independently read by me): Sinus rhythm with sinus arrhythmia, heart rate averaging 68 bpm.  Intervals normal.  December 2015 ECG (independently read by me): Normal sinus rhythm with mild sinus arrhythmia and PACs with ventricular rate at 76 bpm.  QTc interval 427 ms.  No significant ST segment changes.  August 2015 ECG (independently read by me): Sinus rhythm with sinus arrhythmia.  Nonspecific T changes.  QTc interval 421 ms.  08/30/2013 ECG independently read by me today shows normal  sinus rhythm with sinus arrhythmia with a ventricular rate in the 80s. They're nonspecific ST-T  changes.  Prior ECG in October 2014: Sinus rhythm with mild sinus arrhythmia at 60 beats per minute. PR interval 172 ms. QTC interval 384 ms.  LABS: BMP Latest Ref Rng & Units 10/15/2016 02/17/2015 03/20/2014  Glucose 65 - 99 mg/dL 147(H) 151(H) 152(H)  BUN 7 - 25 mg/dL 14 16 11   Creatinine 0.70 - 1.18 mg/dL 1.00 1.03 0.90  Sodium 135 - 146 mmol/L 137 140 139  Potassium 3.5 - 5.3 mmol/L 4.3 4.4 4.1  Chloride 98 - 110 mmol/L 102 101 99  CO2 20 - 31 mmol/L 26 28 24   Calcium 8.6 - 10.3 mg/dL 9.1 9.3 8.9   Hepatic Function Latest Ref Rng & Units 10/15/2016 02/17/2015 03/20/2014  Total Protein 6.1 - 8.1 g/dL 6.2 6.3 6.2  Albumin 3.6 - 5.1 g/dL 4.0 4.1 3.5  AST 10 - 35 U/L 19 18 19   ALT 9 - 46 U/L 19 16 18   Alk Phosphatase 40 - 115 U/L 29(L) 31(L) 46  Total Bilirubin 0.2 - 1.2 mg/dL 0.7 0.6 0.3   CBC Latest Ref Rng & Units 10/15/2016 02/17/2015 03/20/2014  WBC 3.8 - 10.8 K/uL 6.3 5.3 8.0  Hemoglobin 13.2 - 17.1 g/dL 14.1 14.1 13.2  Hematocrit 38.5 - 50.0 % 41.7 41.2 38.8(L)  Platelets 140 - 400 K/uL 125(L) 137(L) 130(L)   Lab Results  Component Value Date   MCV 91.9 10/15/2016   MCV 89.8 02/17/2015   MCV 88.2 03/20/2014   Lab Results  Component Value Date   TSH 2.98 10/15/2016   Lab Results  Component Value Date   HGBA1C 6.5 (H) 03/20/2014   Lipid Panel     Component Value Date/Time   CHOL 98 10/15/2016 0858   CHOL 118 08/01/2013 0952   TRIG 67 10/15/2016 0858   TRIG 96 08/01/2013 0952   HDL 38 (L) 10/15/2016 0858   HDL 46 08/01/2013 0952   CHOLHDL 2.6 10/15/2016 0858   VLDL 13 10/15/2016 0858   LDLCALC 47 10/15/2016 0858   LDLCALC 53 08/01/2013 0952    Blood work done by Dr. Ardeth Perfect at [facility] from 04/14/2015.  Renal function liver function studies were normal.  Glucose was elevated at 164.  Lipid studies revealed a total cholesterol 121, triglycerides 80, HDL 36, LDL 73.  Hemoglobin A1c was 6.5.  TSH was normal at 2.88.  Apo lipoprotein B was  71.   RADIOLOGY: No results found.  IMPRESSION:   1. Coronary artery disease due to lipid rich plaque   2. Hyperlipidemia with target LDL less than 70   3. Orthostatic hypotension   4. Obstructive sleep apnea   5. Chronic obstructive pulmonary disease, unspecified COPD type ([place])     ASSESSMENT AND PLAN: Mr. Tiras Bianchini is a 76 year old gentleman with a history of diabetes mellitus, hyperlipidemia, obstructive sleep apnea  with supplemental oxygen, as well as COPD. He has a prior tobacco history but he quit on 01/17/1999. He is status post CABG surgery x4 in January 2006 and in 2013 in [place] had abnormal nuclear study.  Repeat catheterization demonstrated some mild blockages.  A prior nuclear study  raised the suspicion for ischemia in the RCA territory.  He had a mild hypotensive response to exercise and had mild ST changes.  He denies any chest pressure.  He had an abnormal stress test in 2013 which led to his repeat cardiac catheterization.  On prior nuclear studies in  [place] he has had consistent inferior defect which oftentimes has been interpreted as diaphragmatic attenuation.  Over the past year he had experienced only one short episode of chest pain  on his  dose of Ranexa 1022m twice a day, Toprol-XL 50 mg, isosorbide 30 mg, in addition to his aspirin,/Plavix.  He has hyperlipidemia  on Crestor 20 mg in addition to Zetia 10 mg and Lovaza.  His most recent lipid studies were excellent with a total cholesterol 98, triglycerides 67, HDL 38, and LDL 47.  When I last saw him, he was significantly orthostatic.  I decreased and ultimately discontinued isosorbide.  He has not had any recurrent anginal symptomatology.  He is now been on Midodrin at only 2.5 mg twice a day, and on exam today he no longer is orthostatic.  His prior symptomatology has resolved.  There is no edema.  With his support socks but today he did not put his left stocking on and there was trace edema in the left ankle.   He will continue with his current regimen.  He has obstructive sleep apnea but is no longer using CPAP therapy.  He had been followed by Dr. YAnnamaria Boots  As long as he remains stable, I will see him in 4 months for reevaluation Time spent: 25 minutes  TTroy Sine MD, [facility]5/10/2016 7:36 PM

## 2016-12-23 NOTE — Patient Instructions (Signed)
Your physician recommends that you schedule a follow-up appointment in: 4 months with Dr Claiborne Billings.  Continue all medications as prescribed. No changes were made today.

## 2016-12-27 ENCOUNTER — Ambulatory Visit (INDEPENDENT_AMBULATORY_CARE_PROVIDER_SITE_OTHER): Payer: Medicare Other | Admitting: Internal Medicine

## 2016-12-27 ENCOUNTER — Ambulatory Visit (INDEPENDENT_AMBULATORY_CARE_PROVIDER_SITE_OTHER)
Admission: RE | Admit: 2016-12-27 | Discharge: 2016-12-27 | Disposition: A | Discharge status: Home / Self Care | Payer: Medicare Other | Source: Ambulatory Visit | Attending: Internal Medicine | Admitting: Internal Medicine

## 2016-12-27 ENCOUNTER — Encounter: Payer: Self-pay | Admitting: Internal Medicine

## 2016-12-27 VITALS — BP 122/64 | HR 70 | Resp 14 | Ht 70.0 in | Wt 197.0 lb

## 2016-12-27 DIAGNOSIS — I2510 Atherosclerotic heart disease of native coronary artery without angina pectoris: Secondary | ICD-10-CM | POA: Diagnosis not present

## 2016-12-27 DIAGNOSIS — J449 Chronic obstructive pulmonary disease, unspecified: Secondary | ICD-10-CM

## 2016-12-27 DIAGNOSIS — R0609 Other forms of dyspnea: Secondary | ICD-10-CM | POA: Diagnosis not present

## 2016-12-27 DIAGNOSIS — I2583 Coronary atherosclerosis due to lipid rich plaque: Secondary | ICD-10-CM

## 2016-12-27 DIAGNOSIS — G4733 Obstructive sleep apnea (adult) (pediatric): Secondary | ICD-10-CM

## 2016-12-27 DIAGNOSIS — R0600 Dyspnea, unspecified: Secondary | ICD-10-CM | POA: Diagnosis not present

## 2016-12-27 MED ORDER — FLUTICASONE-UMECLIDIN-VILANT 100-62.5-25 MCG/INH IN AEPB: 1.0000 | INHALATION_SPRAY | Freq: Once | RESPIRATORY_TRACT | 0 refills | Status: AC

## 2016-12-27 NOTE — Progress Notes (Signed)
HPI male former smoker followed for COPD/emphysema, asbestos exposure, OSA/ failed CPAP, complicated by CAD/CABG 4V, DM, history major trauma leg, prostate CVA NPSG 06/01/15- AHI 48.1/ hr, desat to 81%, CPAP to 13, body weight 220 pounds Noncompliant with CPAP and not willing to explore alternatives. PFT 01/01/13- minimal obstructive airways disease with air trapping, mild response to bronchodilator, diffusion slightly reduced. FVC 4.11/96%, FEV1 2.59/82%, ratio 0.63, FEV1 25-75 %-65/83%, TLC 100%, DLCO 76% Echocardiogram 10/28/16-EF 55-60 percent, grade 1 diastolic dysfunction, PA pressure 35 Walk Test on room air 12/27/16-94%/HR 72, 93%/HR 79, 93%/HR 81, 94%/HR 88. No desaturation or tachycardia with this exercise for 3180 feet. --------------------------------------------------------------------------------------------  05/11/2016-76 year old male former smoker followed for COPD/emphysema, asbestos exposure, OSA/ failed CPAP, complicated by CAD/CABG 4V, DM, history major trauma leg, prostate CVA FOLLOWS FOR: breathing is doing well, no new complaints.  has not been wearing CPAP x4 months d/t blister on his nose from the mask.   Wife here He has not been using CPAP and does not intend to resume. He is not interested in alternatives. Usually sleeps sitting up in a recliner. Feels very comfortable with his breathing without acute attacks routine cough or wheeze. He uses his inhalers "when I need to". CXR 11/07/15-no acute disease. Minor scarring. Old thoracic compression fracture.  12/27/16- 76 year old male former smoker followed for COPD/emphysema, asbestos exposure, OSA/ failed CPAP, complicated by CAD/CABG 4V, DM, history major trauma leg, prostate CVA FOLLOW UP FOR Dr. Claiborne Billings wanted a follow up for him because he is so sob. Pt does not use his cpap machine at all.  Describes dyspnea on exertion with hills and inclines, stopping 2 catch his breath. Comfortable at rest and supine. Denies cough or wheeze.  Says he walks his dog "a mile a day" Sleeping now in an adjustable bed but he prefers his recliner chair which is more comfortable. Breo 100 daily, rare need rescue inhaler. PFT 01/01/13 had shown minimal obstructive airways disease with air trapping, mild response to bronchodilator, diffusion is slightly reduced. FEV1/FVC 0.63, TLC 100%, DLCO 76% Walk Test on room air 12/27/16-94%/HR 72, 93%/HR 79, 93%/HR 81, 94%/HR 88. No desaturation or tachycardia with this exercise for 3180 feet.  ROS-see HPI Constitutional:   No-   weight loss, night sweats, fevers, chills, +fatigue, lassitude. HEENT:   No-  headaches, difficulty swallowing, tooth/dental problems, sore throat,       No-  sneezing, itching, ear ache, nasal congestion, post nasal drip,  CV:  No-   chest pain, orthopnea, PND, swelling in lower extremities, anasarca, dizziness, palpitations Resp: + shortness of breath with exertion or at rest.              No-   productive cough,  + non-productive cough,  No- coughing up of blood.              No-   change in color of mucus.  No- wheezing.   Skin: No-   rash or lesions. GI:  No-   heartburn, indigestion, abdominal pain, nausea, vomiting, GU:  MS:  No-   joint pain or swelling.  . Neuro-     nothing unusual Psych:  No- change in mood or affect. + depression or anxiety.  No memory loss.  OBJ- Physical Exam General- Alert, Oriented, Affect-appropriate, Distress- none acute. + Mild overweight, looks very comfortable Skin- rash-none, lesions- none, excoriation- none Lymphadenopathy- none Head- atraumatic            Eyes- Gross vision intact, PERRLA, conjunctivae and  secretions clear            Ears- Hearing, canals-normal            Nose-  Clear, no-Septal dev, mucus, polyps, erosion, perforation             Throat- Mallampati II , mucosa clear , drainage- none, tonsils- atrophic Neck- flexible , trachea midline, no stridor , thyroid nl, carotid no bruit Chest - symmetrical excursion ,  unlabored           Heart/CV- RRR , no murmur , no gallop  , no rub, nl s1 s2                           - JVD- none , edema- none, stasis changes- none, varices- none           Lung-  clear to P&A, wheeze- none, cough-none , dullness-none, rub- none           Chest wall-  Abd-  Br/ Gen/ Rectal- Not done, not indicated Extrem- cyanosis- none, clubbing, none, atrophy- none, strength- nl. -+cane. Support stockings Neuro- grossly intact to observation

## 2016-12-27 NOTE — Patient Instructions (Addendum)
Order- walk test on room air     Dx dyspnea on exertion  Order- CXR  Order schedule Pft  Sample  Trelegy Ellipta inhaler    Inhale 1 puff, once daily, then rinse mouth     Try this instead of Breo until the sample runs out, then go back to [facility] for comparison  Please call as needed

## 2016-12-28 NOTE — Assessment & Plan Note (Addendum)
Pulmonary exam is unimpressive. I suspect he is mostly deconditioned and not motivated to exercise. Plan-walk test on room air, schedule repeat PFT for comparison, sample Trelegy Ellipta for trial, CXR

## 2016-12-28 NOTE — Progress Notes (Signed)
Spoke with patient informed him of results. Pt did not have any questions and verbalized understanding. Nothing further is needed.

## 2016-12-28 NOTE — Assessment & Plan Note (Signed)
He is resistant to any treatment effort. Sleeps propped up either in recliner, which he prefers, or in new adjustable bed

## 2016-12-28 NOTE — Assessment & Plan Note (Signed)
Discussion as above. Walk test shows no tachycardia or desaturation

## 2017-02-04 DIAGNOSIS — C61 Malignant neoplasm of prostate: Secondary | ICD-10-CM | POA: Diagnosis not present

## 2017-02-10 ENCOUNTER — Telehealth: Payer: Self-pay | Admitting: Internal Medicine

## 2017-02-10 NOTE — Telephone Encounter (Signed)
Spoke with pt's spouse, Daine Floras, who is requesting Rx for Trelegy to be sent to [facility]. Pt was given a sample of Trelegy at his last OV with CY on 12/27/16. pt was instructed to try Trelegy for comparison to [facility]. Pt feels that Trelegy is more effective and wished to continue on this medication.  CY please advise. Thanks.   Current Outpatient Prescriptions on File Prior to Visit  Medication Sig Dispense Refill  . albuterol (PROVENTIL HFA;VENTOLIN HFA) 108 (90 Base) MCG/ACT inhaler Inhale 2 puffs into the lungs every 6 (six) hours as needed for wheezing. 3 Inhaler 3  . aspirin EC 81 MG tablet Take 1 tablet (81 mg total) by mouth daily. 90 tablet 3  . clopidogrel (PLAVIX) 75 MG tablet Take 1 tablet (75 mg total) by mouth daily. 90 tablet 3  . DULoxetine (CYMBALTA) 60 MG capsule Take 60 mg by mouth daily.    Marland Kitchen ezetimibe (ZETIA) 10 MG tablet Take 1 tablet (10 mg total) by mouth every morning. 90 tablet 3  . fluticasone furoate-vilanterol (BREO ELLIPTA) 100-25 MCG/INH AEPB Inhale 1 puff then rinse mouth, once daily 3 each 3  . Ibuprofen-Diphenhydramine Cit ([facility] IBUPROFEN PM) 200-38 MG TABS Take by mouth. As needed    . metoprolol succinate (TOPROL-XL) 25 MG 24 hr tablet Take 1 tablet (25 mg total) by mouth daily. 90 tablet 3  . midodrine (PROAMATINE) 2.5 MG tablet Take 1 tablet (2.5 mg total) by mouth 2 (two) times daily with a meal. 60 tablet 0  . niacin 500 MG tablet Take 500 mg by mouth daily. OTC    . nitroGLYCERIN (NITROSTAT) 0.3 MG SL tablet Place 0.3 mg under the tongue every 5 (five) minutes as needed for chest pain.    . NON FORMULARY Take 2 capsules by mouth daily. Glucocil    . omega-3 acid ethyl esters (LOVAZA) 1 g capsule Take 1 capsule (1 g total) by mouth daily. 90 capsule 3  . ranolazine (RANEXA) 1000 MG SR tablet Take 1 tablet (1,000 mg total) by mouth 2 (two) times daily. 180 tablet 3  . rosuvastatin (CRESTOR) 20 MG tablet Take 1 tablet (20 mg total) by mouth daily. 90 tablet 3   No  current facility-administered medications on file prior to visit.     Allergies  Allergen Reactions  . Niacin And Related Itching    Reaction to Rx strength only, OTC is okay

## 2017-02-11 DIAGNOSIS — R350 Frequency of micturition: Secondary | ICD-10-CM | POA: Diagnosis not present

## 2017-02-11 DIAGNOSIS — N5201 Erectile dysfunction due to arterial insufficiency: Secondary | ICD-10-CM | POA: Diagnosis not present

## 2017-02-11 DIAGNOSIS — C61 Malignant neoplasm of prostate: Secondary | ICD-10-CM | POA: Diagnosis not present

## 2017-02-11 MED ORDER — FLUTICASONE-UMECLIDIN-VILANT 100-62.5-25 MCG/INH IN AEPB: 1.0000 | INHALATION_SPRAY | Freq: Every day | RESPIRATORY_TRACT | 3 refills | Status: DC

## 2017-02-11 NOTE — Telephone Encounter (Signed)
Spoke with pt. He is aware that we will send in this prescription. Rx has been sent to [facility] per his request. Nothing further was needed.

## 2017-02-11 NOTE — Telephone Encounter (Signed)
Ok to Rx Trelegy Ellipta    # 1 inhaler,  Inhale 1 puff, then rinse mouth, once daily, refill x 12 I can't promise his insurance will cover it.

## 2017-03-11 ENCOUNTER — Ambulatory Visit (INDEPENDENT_AMBULATORY_CARE_PROVIDER_SITE_OTHER): Payer: Medicare Other | Admitting: Internal Medicine

## 2017-03-11 ENCOUNTER — Encounter: Payer: Self-pay | Admitting: Internal Medicine

## 2017-03-11 DIAGNOSIS — J449 Chronic obstructive pulmonary disease, unspecified: Secondary | ICD-10-CM

## 2017-03-11 DIAGNOSIS — I2583 Coronary atherosclerosis due to lipid rich plaque: Secondary | ICD-10-CM | POA: Diagnosis not present

## 2017-03-11 DIAGNOSIS — R0609 Other forms of dyspnea: Secondary | ICD-10-CM | POA: Diagnosis not present

## 2017-03-11 DIAGNOSIS — I2510 Atherosclerotic heart disease of native coronary artery without angina pectoris: Secondary | ICD-10-CM

## 2017-03-11 LAB — PULMONARY FUNCTION TEST
DL/VA % pred: 72 %
DL/VA: 3.27 ml/min/mmHg/L
DLCO COR % PRED: 64 %
DLCO cor: 20.13 ml/min/mmHg
DLCO unc % pred: 60 %
DLCO unc: 18.9 ml/min/mmHg
FEF 25-75 POST: 1.2 L/s
FEF 25-75 Pre: 0.68 L/sec
FEF2575-%Change-Post: 75 %
FEF2575-%PRED-POST: 55 %
FEF2575-%Pred-Pre: 31 %
FEV1-%CHANGE-POST: 10 %
FEV1-%PRED-POST: 78 %
FEV1-%Pred-Pre: 71 %
FEV1-Post: 2.33 L
FEV1-Pre: 2.11 L
FEV1FVC-%Change-Post: 4 %
FEV1FVC-%PRED-PRE: 82 %
FEV6-%Change-Post: 9 %
FEV6-%Pred-Post: 94 %
FEV6-%Pred-Pre: 86 %
FEV6-Post: 3.63 L
FEV6-Pre: 3.33 L
FEV6FVC-%Change-Post: 3 %
FEV6FVC-%Pred-Post: 103 %
FEV6FVC-%Pred-Pre: 100 %
FVC-%CHANGE-POST: 5 %
FVC-%PRED-POST: 90 %
FVC-%PRED-PRE: 86 %
FVC-Post: 3.73 L
FVC-Pre: 3.53 L
PRE FEV6/FVC RATIO: 94 %
Post FEV1/FVC ratio: 62 %
Post FEV6/FVC ratio: 97 %
Pre FEV1/FVC ratio: 60 %
RV % pred: 131 %
RV: 3.27 L
TLC % pred: 106 %
TLC: 7.27 L

## 2017-03-11 NOTE — Patient Instructions (Signed)
We can continue current meds   I encourage you to walk regularly and try to get regular exercise to keep your stamina up  We will watch for opportunities to help with the sleep apnea, as discussed.

## 2017-03-11 NOTE — Progress Notes (Signed)
PFT done today. 

## 2017-03-11 NOTE — Progress Notes (Signed)
HPI male former smoker followed for COPD/emphysema, asbestos exposure, OSA/ failed CPAP, complicated by CAD/CABG 4V, DM, history major trauma leg, prostate CA NPSG 06/01/15- AHI 48.1/ hr, desat to 81%, CPAP to 13, body weight 220 pounds Noncompliant with CPAP and not willing to explore alternatives. PFT 01/01/13- minimal obstructive airways disease with air trapping, mild response to bronchodilator, diffusion slightly reduced. FVC 4.11/96%, FEV1 2.59/82%, ratio 0.63, FEV1 25-75 %-65/83%, TLC 100%, DLCO 76% Echocardiogram 10/28/16-EF 55-60 percent, grade 1 diastolic dysfunction, PA pressure 35 Walk Test on room air 12/27/16-94%/HR 72, 93%/HR 79, 93%/HR 81, 94%/HR 88. No desaturation or tachycardia with this exercise for 3180 feet. PFT: 03/11/17-mild obstruction, mild diffusion defect. No response to dilator. FVC 3.73/90%, FEV1 2.33/78%, ratio 0.62, TLC 106%, DLCO 60%. --------------------------------------------------------------------------------------------  12/27/16- 76 year old male former smoker followed for COPD/emphysema, asbestos exposure, OSA/ failed CPAP, complicated by CAD/CABG 4V, DM, history major trauma leg, prostate CA FOLLOW UP FOR Dr. Claiborne Billings wanted a follow up for him because he is so sob. Pt does not use his cpap machine at all.  Describes dyspnea on exertion with hills and inclines, stopping 2 catch his breath. Comfortable at rest and supine. Denies cough or wheeze. Says he walks his dog "a mile a day" Sleeping now in an adjustable bed but he prefers his recliner chair which is more comfortable. Breo 100 daily, rare need rescue inhaler. PFT 01/01/13 had shown minimal obstructive airways disease with air trapping, mild response to bronchodilator, diffusion is slightly reduced. FEV1/FVC 0.63, TLC 100%, DLCO 76% Walk Test on room air 12/27/16-94%/HR 72, 93%/HR 79, 93%/HR 81, 94%/HR 88. No desaturation or tachycardia with this exercise for 3180 feet.  03/11/17-76 year old male former smoker  followed for COPD/emphysema, asbestos exposure, OSA/ failed CPAP, complicated by CAD/CABG 4V, DM, history major trauma leg, prostate CA FOLLOWS FOR: Pt had PFT today and states his breathing has been doing well since last OV. Trelegy is the past inhaler he has worked with. Little wheeze or cough. Adjustable bed helps. He still refuses any treatment for OSA. PFT: 03/11/17-mild obstruction, mild diffusion defect. No response to dilator. FVC 3.73/90%, FEV1 2.33/78%, ratio 0.62, TLC 106%, DLCO 60%.  ROS-see HPI Constitutional:   No-   weight loss, night sweats, fevers, chills, +fatigue, lassitude. HEENT:   No-  headaches, difficulty swallowing, tooth/dental problems, sore throat,       No-  sneezing, itching, ear ache, nasal congestion, post nasal drip,  CV:  No-   chest pain, orthopnea, PND, swelling in lower extremities, anasarca, dizziness, palpitations Resp: + shortness of breath with exertion or at rest.              No-   productive cough,  + non-productive cough,  No- coughing up of blood.              No-   change in color of mucus.  No- wheezing.   Skin: No-   rash or lesions. GI:  No-   heartburn, indigestion, abdominal pain, nausea, vomiting, GU:  MS:  No-   joint pain or swelling.  . Neuro-     nothing unusual Psych:  No- change in mood or affect. + depression or anxiety.  No memory loss.  OBJ- Physical Exam  stable baseline exam General- Alert, Oriented, Affect-appropriate, Distress- none acute. + Mild overweight, looks very comfortable Skin- rash-none, lesions- none, excoriation- none Lymphadenopathy- none Head- atraumatic            Eyes- Gross vision intact, PERRLA, conjunctivae and secretions  clear            Ears- Hearing, canals-normal            Nose-  Clear, no-Septal dev, mucus, polyps, erosion, perforation             Throat- Mallampati II , mucosa clear , drainage- none, tonsils- atrophic Neck- flexible , trachea midline, no stridor , thyroid nl, carotid no  bruit Chest - symmetrical excursion , unlabored           Heart/CV- RRR , no murmur , no gallop  , no rub, nl s1 s2                           - JVD- none , edema- none, stasis changes- none, varices- none           Lung-  clear to P&A, wheeze- none, cough-none , dullness-none, rub- none           Chest wall-  Abd-  Br/ Gen/ Rectal- Not done, not indicated Extrem- cyanosis- none, clubbing, none, atrophy- none, strength- nl. -+cane. Support stockings Neuro- grossly intact to observation

## 2017-03-12 NOTE — Assessment & Plan Note (Signed)
He feels well controlled. He is willing to continue Trelegy. Plan-emphasize aerobic exercise for endurance.

## 2017-03-12 NOTE — Assessment & Plan Note (Signed)
Recognize deconditioning and cardiac limitations are likely more important restrictions then his pulmonary status. Plan-aerobic exercise as tolerated.

## 2017-03-15 DIAGNOSIS — Z961 Presence of intraocular lens: Secondary | ICD-10-CM | POA: Diagnosis not present

## 2017-03-15 DIAGNOSIS — H43813 Vitreous degeneration, bilateral: Secondary | ICD-10-CM | POA: Diagnosis not present

## 2017-03-15 DIAGNOSIS — H26491 Other secondary cataract, right eye: Secondary | ICD-10-CM | POA: Diagnosis not present

## 2017-03-15 DIAGNOSIS — E119 Type 2 diabetes mellitus without complications: Secondary | ICD-10-CM | POA: Diagnosis not present

## 2017-04-22 DIAGNOSIS — E1140 Type 2 diabetes mellitus with diabetic neuropathy, unspecified: Secondary | ICD-10-CM | POA: Diagnosis not present

## 2017-04-22 DIAGNOSIS — I10 Essential (primary) hypertension: Secondary | ICD-10-CM | POA: Diagnosis not present

## 2017-04-22 DIAGNOSIS — E784 Other hyperlipidemia: Secondary | ICD-10-CM | POA: Diagnosis not present

## 2017-04-22 DIAGNOSIS — Z125 Encounter for screening for malignant neoplasm of prostate: Secondary | ICD-10-CM | POA: Diagnosis not present

## 2017-04-26 ENCOUNTER — Ambulatory Visit (INDEPENDENT_AMBULATORY_CARE_PROVIDER_SITE_OTHER): Payer: Medicare Other | Admitting: Cardiovascular Disease

## 2017-04-26 ENCOUNTER — Encounter: Payer: Self-pay | Admitting: Cardiovascular Disease

## 2017-04-26 VITALS — BP 133/73 | HR 65 | Ht 70.0 in | Wt 199.2 lb

## 2017-04-26 DIAGNOSIS — G4733 Obstructive sleep apnea (adult) (pediatric): Secondary | ICD-10-CM | POA: Diagnosis not present

## 2017-04-26 DIAGNOSIS — I2583 Coronary atherosclerosis due to lipid rich plaque: Secondary | ICD-10-CM | POA: Diagnosis not present

## 2017-04-26 DIAGNOSIS — J449 Chronic obstructive pulmonary disease, unspecified: Secondary | ICD-10-CM

## 2017-04-26 DIAGNOSIS — I2510 Atherosclerotic heart disease of native coronary artery without angina pectoris: Secondary | ICD-10-CM

## 2017-04-26 DIAGNOSIS — I951 Orthostatic hypotension: Secondary | ICD-10-CM

## 2017-04-26 DIAGNOSIS — E785 Hyperlipidemia, unspecified: Secondary | ICD-10-CM | POA: Diagnosis not present

## 2017-04-26 NOTE — Patient Instructions (Signed)
Medication Instructions:  Ok to try to decrease Ranexa to 500 (1/2 tablet) two times daily.  Follow-Up: Your physician wants you to follow-up in: 6 MONTHS with Dr. Claiborne Billings.  You will receive a reminder letter in the mail two months in advance. If you don't receive a letter, please call our office to schedule the follow-up appointment.   If you need a refill on your cardiac medications before your next appointment, please call your pharmacy.

## 2017-04-26 NOTE — Progress Notes (Signed)
Patient ID: TIWAN SCHNITKER, male   DOB: [birth year], 76 y.o.   MRN: [id number]    Primary M.D.: Dr. Jose Persia  HPI:  Mr. Simone Tuckey is a 76 year old gentleman who presents for a 4 month follow-up cardiology evaluation.  Mr. Galik has a history of hypertension, hyperlipidemia, COPD, prior asbestos exposure, diabetes mellitus , and  prostate cancer. In January 2006, he underwent CABG surgery x4  in [facility] by Dr. Zorita Pang. I do not have the specifics of his bypass grafts. He had stress test in [place] in 2013 after he had experienced some recurrent chest pain episodes and a cardiac catheterization which showed mild blockages.  He also has a history of obstructive sleep apnea on CPAP therapy with 3 L of oxygen and is followed by Dr. Keturah Barre.  Mr. Fermin admits to mild shortness of breath.  He has a history of hyperlipidemia.   In the past he had been on isosorbide mononitrate therapy but stopped taking this because of libido concerns and potential need for medications for erectile function.  An echo Doppler study on 04/03/2013 showed an ejection fraction of 55-60%; mild tricuspid regurgitation, and grade 1 diastolic dysfunction. When I saw him, I recommended that we perform advanced lipid testing with NMR lipoprotein of since he has had low HDL levels and her recent cholesterol was 185 triglycerides 153 HDL 31 and LDL 123. At that time, I also started him on Ranexa 500 mg twice a day since he had discontinued his isosorbide. I added Zetia 10 mg to his atorvastatin and recommended that he undergo a followup with NMR lipoprotein assessment.  His NMR study was markedly abnormal. Specifically, his total cholesterol was 187 triglycerides 348 HDL 32 LDL cholesterol 85. However, he had markedly increased goal LDL particles at 1739 status LDL particle number was markedly increased at 2463. The patient tells me he did develop some myalgias on Lipitor and therefore for approximately the  last 3-4 weeks completely discontinued a torus that in therapy.  He was  hospitalized on December 26 through 08/21/2013 with a partial small bowel obstruction. He had experienced nausea and vomiting with diarrhea for 4 days prior to his admission and was found to have a partial mid-to high grade small bowel obstruction with a transition point in the right lower quadrant. He was cleared for surgery and if surgery was to be done he was to stop his Plavix and continue aspirin. Fortunately, his bowel obstruction resolved with medical management and surgical intervention was not necessary.  I'd seen him in January 2015 at which time he was off Plavix therapy and was not having any anginal symptoms on his current medical regimen.  He was hospitalized on 03/19/2014 through 03/21/2014 with ataxia.  He had complaints of vertigo with nausea, vomiting, double vision, numbness, and weakness.  A CT of the brain did not show acute intracranial abnormalities, but did show chronic atrophy and small vessel ischemic changes.  A 2-D echo Doppler study showed an EF of 45-50% without cardiac source of emboli.  Carotid studies revealed mild bilateral plaque with narrowing of 1-39% range.  Antegrade flow is vertebral artery.  Doppler of the brachial artery demonstrate triphasic waveforms.  He was felt possibly to have a posterior circulation TIA.  His aspirin 81 mg was increased to 325 mg.  and I last saw him in August 2015 due to his recent TIA.  I recommended reinitiation of Plavix 75 mg daily and reduced his aspirin dose back  to 81 mg.  When I saw several months ago he denied any significant chest pain but had experienced one short episode in February that was nonexertional.  However, he had noticed increasing exertional shortness of breath with activity.  He had stopped using his CPAP therapy and complained of increased fatigue.  He had not seen Dr. Maple Hudson in some time and I recommended that when he saw him in July that  consideration for follow-up sleep study.  He at home.  At that time, he was planning to undergo cataract surgery sometime this fall.  Due to his exertional shortness of breath and his established coronary artery disease.  I recommended a nuclear perfusion study.  This was done on 02/28/2015 and was intermediate risk.  He developed mild ST segment depression and had occasional PACs and PVCs.  Scintigraphic images revealed a medium defect of moderate severity in the RCA distribution.  He also had an abnormal blood pressure response with hypotension during stress.  I was able to obtain the records from [place] to see if he had any significant inferior defect on prior nuclear studies.  Review his records dating back to 2006 including catheterization studies and subsequent nuclear imaging studies.  On the majority of his nuclear studies dating from 2007.  He was found to have mild fixed basal posterior lateral defect and there was some concern of possible attenuation artifact.  He was not found to have significant ischemia.  A nuclear study in 2011 was interpreted as indeterminate for ischemia with motion artifact.  His last study in [place] was in 2013 which again showed a fixed inferior defect and was felt that he had a low probability of ischemia.   He had seen a neurologist for evaluation of confusion, and staggering gait.  He also had an episode of dizziness yesterday and when he checked his blood pressure.  This was stable at 118/67 with a heart rate at 74.  Since I last saw him, he tells me he has not been using his CPAP for some time.  He dotes some  weakness when he stands abruptly.  He denies any recent chest pain.  He is unaware of palpitations.  He underwent an echo Doppler study on 10/28/2016 which showed an EF 55-60%.  There were no wall motion abnormalities.  There was grade 1 diastolic dysfunction.  PA pressure estimate was 35 mm.  On 10/29/2016, he underwent a nuclear stress test where his EF was  reported at 49%.  No ischemia was noted.  He also underwent carotid duplex imaging.  On 10/29/2016 which showed heterogeneous plaque bilaterally with 1-39%, mild bilateral carotid stenoses, not felt to be significant.  He had normal subclavian arteries, and patent vertebral arteries with antegrade flow.    When I last saw him in March 2018, he had significant orthostatic hypotension and his blood pressure dropped from 114/70 supine to 88/62 standing.  I reduced and ultimately discontinued isosorbide.  Off this therapy, he has not had any recurrent anginal symptomatology.  I started him on Midodrin at 2.5 mg initially twice a day.  I recommended compression stockings to both legs with at least 20-30 mm of support.  With this adjustment, he felt significantly improved.  At his follow-up office visit in May 2018.  He was no longer orthostatic and had resolution of prior symptoms.  Over the past several months he has felt well.  He denies chest pain.  He continues to use support stockings.  He does have daytime  sleepiness, and his sleep apnea remains untreated.  He was wondering about the need to continue ranolazine.  He presents for evaluation.  Past Medical History:  Diagnosis Date  . Angina at [facility])   . Anxiety   . Arthritis   . Asthma   . Coronary artery disease    recently started seeing Dr. Claiborne Billings   . Depression   . Diabetes ([place])   . Elevated PSA   . Emphysema   . GERD (gastroesophageal reflux disease)   . Hx of radiation therapy 09/20/13- 11/14/13   prostate 7800 cGy 40 sessions, seminal vesicles 5600 cGy in 40 sessions  . Hypercholesteremia   . Hypertension   . PONV (postoperative nausea and vomiting)   . Prostate cancer ([place]) 05/23/13   gleason 3+4=7, volume 31 cc  . Sleep apnea    c pap with oxygen at 2.5 L    Past Surgical History:  Procedure Laterality Date  . BACK SURGERY  2006   discectomy lumbar  . BACK SURGERY    . BACK SURGERY    . degloving rt leg injury  2000   had  skin grafts  . FOOT SURGERY    . heart bypass  2006   [facility]  . HERNIA REPAIR  2008   x 3 w/mesh, bilat inguinla, umbilical  . PROSTATE BIOPSY N/A 05/23/2013   Procedure: BIOPSY TRANSRECTAL ULTRASONIC PROSTATE (TUBP);  Surgeon: Molli Hazard, MD;  Location: [facility];  Service: Urology;  Laterality: N/A;  prostate nerve block  . TONSILLECTOMY     w/adenoidectomy    Allergies  Allergen Reactions  . Niacin And Related Itching    Reaction to Rx strength only, OTC is okay    Current Outpatient Prescriptions  Medication Sig Dispense Refill  . albuterol (PROVENTIL HFA;VENTOLIN HFA) 108 (90 Base) MCG/ACT inhaler Inhale 2 puffs into the lungs every 6 (six) hours as needed for wheezing. 3 Inhaler 3  . aspirin EC 81 MG tablet Take 1 tablet (81 mg total) by mouth daily. 90 tablet 3  . clopidogrel (PLAVIX) 75 MG tablet Take 1 tablet (75 mg total) by mouth daily. 90 tablet 3  . ezetimibe (ZETIA) 10 MG tablet Take 1 tablet (10 mg total) by mouth every morning. 90 tablet 3  . Fluticasone-Umeclidin-Vilant (TRELEGY ELLIPTA) 100-62.5-25 MCG/INH AEPB Inhale 1 puff into the lungs daily. 180 each 3  . Ibuprofen-Diphenhydramine Cit ([employer] IBUPROFEN PM) 200-38 MG TABS Take by mouth. As needed    . metoprolol succinate (TOPROL-XL) 25 MG 24 hr tablet Take 1 tablet (25 mg total) by mouth daily. 90 tablet 3  . midodrine (PROAMATINE) 2.5 MG tablet Take 1 tablet (2.5 mg total) by mouth 2 (two) times daily with a meal. 60 tablet 0  . niacin 500 MG tablet Take 500 mg by mouth daily. OTC    . nitroGLYCERIN (NITROSTAT) 0.3 MG SL tablet Place 0.3 mg under the tongue every 5 (five) minutes as needed for chest pain.    . NON FORMULARY Take 2 capsules by mouth daily. Glucocil    . omega-3 acid ethyl esters (LOVAZA) 1 g capsule Take 1 capsule (1 g total) by mouth daily. 90 capsule 3  . ranolazine (RANEXA) 1000 MG SR tablet Take 1 tablet (1,000 mg total) by mouth 2 (two) times daily. 180 tablet 3  . rosuvastatin (CRESTOR)  20 MG tablet Take 1 tablet (20 mg total) by mouth daily. 90 tablet 3   No current facility-administered medications for this visit.  Social history is notable in that he is married to his 2 children ages 47 and 29. He smoked one pack of cigarettes per day until 2000. He does drink occasional alcohol. He does walk  Family History  Problem Relation Age of Onset  . Heart disease Mother   . Heart attack Mother   . Cancer Sister   . Depression Sister    ROS General: Negative; No fevers, chills, or night sweats;  HEENT: Negative; No changes in vision or hearing, sinus congestion, difficulty swallowing Pulmonary: Positive for asbestosis exposure No cough, wheezing, shortness of breath, hemoptysis Cardiovascular: Negative; No chest pain, presyncope, syncope, palpitations GI: Negative; No nausea, vomiting, diarrhea, or abdominal pain GU: Positive for prostate CA No dysuria, hematuria, or difficulty voiding Musculoskeletal: Remote degloving injury; no myalgias, joint pain, or weakness Hematologic/Oncology: Negative; no easy bruising, bleeding Endocrine: Negative; no heat/cold intolerance; no diabetes Neuro: See history of present illness, recent ataxia, probable posterior circulation TIA Skin: Negative; No rashes or skin lesions Psychiatric: Negative; No behavioral problems, depression Sleep: Positive for obstructive sleep apnea with supplemental 3 L oxygen; he stopped using CPAP, apparently due to difficulties with his mask and potential mask leak; since he stopped using CPAP he notes more fatigue, daytime sleepiness,; no bruxism, restless legs, hypnogognic hallucinations, no cataplexy Other comprehensive 14 point system review is negative.   PE BP 133/73   Pulse 65   Ht '5\' 10"'$  (1.778 m)   Wt 199 lb 3.2 oz (90.4 kg)   BMI 28.58 kg/m    Repeat blood pressure by me was 122/70 supine and 112/68 standing  Wt Readings from Last 3 Encounters:  04/26/17 199 lb 3.2 oz (90.4 kg)  03/11/17  201 lb (91.2 kg)  12/27/16 197 lb (89.4 kg)   General: Alert, oriented, no distress.  Skin: normal turgor, no rashes, warm and dry HEENT: Normocephalic, atraumatic. Pupils equal round and reactive to light; sclera anicteric; extraocular muscles intact;  Nose without nasal septal hypertrophy Mouth/Parynx benign; Mallinpatti scale 3 Neck: No JVD, no carotid bruits; normal carotid upstroke Lungs: clear to ausculatation and percussion; no wheezing or rales Chest wall: without tenderness to palpitation Heart: PMI not displaced, RRR, s1 s2 normal, 1/6 systolic murmur, no diastolic murmur, no rubs, gallops, thrills, or heaves Abdomen: Diastases recti soft, nontender; no hepatosplenomehaly, BS+; abdominal aorta nontender and not dilated by palpation. Back: no CVA tenderness Pulses 2+ Musculoskeletal: full range of motion, normal strength, no joint deformities Extremities: No significant edema; no clubbing cyanosis, Homan's sign negative  Neurologic: grossly nonfocal; Cranial nerves grossly wnl Psychologic: Normal mood and affect   ECG (independently read by me): Normal sinus rhythm at 65 bpm with sinus arrhythmia.  QTc interval 416 ms.  PR interval 170 ms.  May 2018 ECG (independently read by me): Sinus rhythm with occasional PVC.  PR interval 182 ms.  QTc interval 435 ms.  March 2018 ECG (independently read by me): Normal sinus rhythm at 68 bpm, mild sinus arrhythmia.  Normal intervals.  No significant ST changes.  February 2018 ECG (independently read by me): Normal sinus rhythm with mild sinus arrhythmia.  Heart rate 69.  PAC.  No significant ST changes.  Normal intervals.  December 2016 ECG (independently read by me): Normal sinus rhythm at 71 bpm with  PACs.  QTc interval normal at 439.  June 2016 ECG (independently read by me): Sinus rhythm with sinus arrhythmia, heart rate averaging 68 bpm.  Intervals normal.  December 2015 ECG (independently read by me):  Normal sinus rhythm with mild  sinus arrhythmia and PACs with ventricular rate at 76 bpm.  QTc interval 427 ms.  No significant ST segment changes.  August 2015 ECG (independently read by me): Sinus rhythm with sinus arrhythmia.  Nonspecific T changes.  QTc interval 421 ms.  08/30/2013 ECG independently read by me today shows normal sinus rhythm with sinus arrhythmia with a ventricular rate in the 80s. They're nonspecific ST-T changes.  Prior ECG in October 2014: Sinus rhythm with mild sinus arrhythmia at 60 beats per minute. PR interval 172 ms. QTC interval 384 ms.  LABS: BMP Latest Ref Rng & Units 10/15/2016 02/17/2015 03/20/2014  Glucose 65 - 99 mg/dL 147(H) 151(H) 152(H)  BUN 7 - 25 mg/dL '14 16 11  '$ Creatinine 0.70 - 1.18 mg/dL 1.00 1.03 0.90  Sodium 135 - 146 mmol/L 137 140 139  Potassium 3.5 - 5.3 mmol/L 4.3 4.4 4.1  Chloride 98 - 110 mmol/L 102 101 99  CO2 20 - 31 mmol/L '26 28 24  '$ Calcium 8.6 - 10.3 mg/dL 9.1 9.3 8.9   Hepatic Function Latest Ref Rng & Units 10/15/2016 02/17/2015 03/20/2014  Total Protein 6.1 - 8.1 g/dL 6.2 6.3 6.2  Albumin 3.6 - 5.1 g/dL 4.0 4.1 3.5  AST 10 - 35 U/L '19 18 19  '$ ALT 9 - 46 U/L '19 16 18  '$ Alk Phosphatase 40 - 115 U/L 29(L) 31(L) 46  Total Bilirubin 0.2 - 1.2 mg/dL 0.7 0.6 0.3   CBC Latest Ref Rng & Units 10/15/2016 02/17/2015 03/20/2014  WBC 3.8 - 10.8 K/uL 6.3 5.3 8.0  Hemoglobin 13.2 - 17.1 g/dL 14.1 14.1 13.2  Hematocrit 38.5 - 50.0 % 41.7 41.2 38.8(L)  Platelets 140 - 400 K/uL 125(L) 137(L) 130(L)   Lab Results  Component Value Date   MCV 91.9 10/15/2016   MCV 89.8 02/17/2015   MCV 88.2 03/20/2014   Lab Results  Component Value Date   TSH 2.98 10/15/2016   Lab Results  Component Value Date   HGBA1C 6.5 (H) 03/20/2014   Lipid Panel     Component Value Date/Time   CHOL 98 10/15/2016 0858   CHOL 118 08/01/2013 0952   TRIG 67 10/15/2016 0858   TRIG 96 08/01/2013 0952   HDL 38 (L) 10/15/2016 0858   HDL 46 08/01/2013 0952   CHOLHDL 2.6 10/15/2016 0858   VLDL 13  10/15/2016 0858   LDLCALC 47 10/15/2016 0858   LDLCALC 53 08/01/2013 0952    Blood work done by Dr. Ardeth Perfect at [facility] from 04/14/2015.  Renal function liver function studies were normal.  Glucose was elevated at 164.  Lipid studies revealed a total cholesterol 121, triglycerides 80, HDL 36, LDL 73.  Hemoglobin A1c was 6.5.  TSH was normal at 2.88.  Apo lipoprotein B was 71.   RADIOLOGY: No results found.  IMPRESSION:   1. Coronary artery disease due to lipid rich plaque   2. Orthostatic hypotension   3. Obstructive sleep apnea   4. Hyperlipidemia with target LDL less than 70   5. Chronic obstructive pulmonary disease, unspecified COPD type ([place])     ASSESSMENT AND PLAN: Mr. Gregorey Nabor is a 76 year old gentleman who has a history of CAD, hyperlipidemia, diabetes mellitus, COPD, and obstructive sleep apnea. He has a prior tobacco history but he quit on 01/17/1999. He is status post CABG surgery x4 in January 2006 and in 2013 in [place] had abnormal nuclear study.  Repeat catheterization demonstrated some mild blockages.  A prior  nuclear study  raised the suspicion for ischemia in the RCA territory.  He had a mild hypotensive response to exercise and had mild ST changes.  He denies any chest pressure.  He had an abnormal stress test in 2013 which led to his repeat cardiac catheterization.  On prior nuclear studies in [place] he has had consistent inferior defect which oftentimes has been interpreted as diaphragmatic attenuation.  Over the past several years he had experienced only one short episode of chest pain  on his  dose of Ranexa '1000mg'$  twice a day, Toprol-XL 50 mg, isosorbide 30 mg, in addition to his aspirin,/Plavix.  He has hyperlipidemia  on Crestor 20 mg in addition to Zetia 10 mg and Lovaza.  His most recent lipid studies were excellent with a total cholesterol 98, triglycerides 67, HDL 38, and LDL 47.  Presently, he is no longer orthostatic and feels well.  He is  taking Midodrin 1 tablet by mouth 2 times a day.Marland Kitchen  He is no longer on nitrate therapy.  Since he has not experienced any anginal symptomatology and due to the cost of Ranexa I will try to see if he can do well on 500 mg bid but if he develops recurrent anginal symptomatology he will increase his dose back to the 1000 twice a day regimen.  He is no longer using CPAP therapy and has daytime sleepiness.  He has been followed in the past by Dr. Keturah Barre for his sleep apnea.  I have suggested a strong consideration for resumption of CPAP treatment.   Time spent: 25 minutes  Troy Sine, MD, [facility] 04/28/2017 9:52 PM

## 2017-04-29 ENCOUNTER — Other Ambulatory Visit: Payer: Self-pay | Admitting: Internal Medicine

## 2017-04-29 DIAGNOSIS — Z1389 Encounter for screening for other disorder: Secondary | ICD-10-CM | POA: Diagnosis not present

## 2017-04-29 DIAGNOSIS — I10 Essential (primary) hypertension: Secondary | ICD-10-CM | POA: Diagnosis not present

## 2017-04-29 DIAGNOSIS — J189 Pneumonia, unspecified organism: Secondary | ICD-10-CM | POA: Diagnosis not present

## 2017-04-29 DIAGNOSIS — Z6829 Body mass index (BMI) 29.0-29.9, adult: Secondary | ICD-10-CM | POA: Diagnosis not present

## 2017-04-29 DIAGNOSIS — R55 Syncope and collapse: Secondary | ICD-10-CM

## 2017-04-29 DIAGNOSIS — E1140 Type 2 diabetes mellitus with diabetic neuropathy, unspecified: Secondary | ICD-10-CM | POA: Diagnosis not present

## 2017-04-29 DIAGNOSIS — I25729 Atherosclerosis of autologous artery coronary artery bypass graft(s) with unspecified angina pectoris: Secondary | ICD-10-CM | POA: Diagnosis not present

## 2017-04-29 DIAGNOSIS — R0609 Other forms of dyspnea: Secondary | ICD-10-CM | POA: Diagnosis not present

## 2017-04-29 DIAGNOSIS — E1136 Type 2 diabetes mellitus with diabetic cataract: Secondary | ICD-10-CM | POA: Diagnosis not present

## 2017-04-29 DIAGNOSIS — Z8673 Personal history of transient ischemic attack (TIA), and cerebral infarction without residual deficits: Secondary | ICD-10-CM

## 2017-04-29 DIAGNOSIS — R5381 Other malaise: Secondary | ICD-10-CM

## 2017-04-29 DIAGNOSIS — R42 Dizziness and giddiness: Secondary | ICD-10-CM

## 2017-04-29 DIAGNOSIS — Z0000 Encounter for general adult medical examination without abnormal findings: Secondary | ICD-10-CM | POA: Diagnosis not present

## 2017-04-29 DIAGNOSIS — E784 Other hyperlipidemia: Secondary | ICD-10-CM | POA: Diagnosis not present

## 2017-04-29 DIAGNOSIS — G4733 Obstructive sleep apnea (adult) (pediatric): Secondary | ICD-10-CM | POA: Diagnosis not present

## 2017-05-12 ENCOUNTER — Ambulatory Visit
Admission: RE | Admit: 2017-05-12 | Discharge: 2017-05-12 | Disposition: A | Discharge status: Home / Self Care | Payer: Medicare Other | Source: Ambulatory Visit | Attending: Internal Medicine | Admitting: Internal Medicine

## 2017-05-12 DIAGNOSIS — Z8673 Personal history of transient ischemic attack (TIA), and cerebral infarction without residual deficits: Secondary | ICD-10-CM

## 2017-05-12 DIAGNOSIS — R55 Syncope and collapse: Secondary | ICD-10-CM

## 2017-05-12 DIAGNOSIS — R42 Dizziness and giddiness: Secondary | ICD-10-CM

## 2017-05-12 DIAGNOSIS — R5381 Other malaise: Secondary | ICD-10-CM

## 2017-06-06 DIAGNOSIS — Z23 Encounter for immunization: Secondary | ICD-10-CM | POA: Diagnosis not present

## 2017-06-23 IMAGING — NM NM MISC PROCEDURE
4 series · 24 of 24 positions shown · non-contrast
Comparison: none

[Series 1: wbr rest · 6.40mm/px · 6 of 63 frames shown]
[frame 6/63]
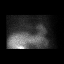
[frame 16/63]
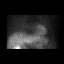
[frame 27/63]
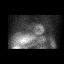
[frame 37/63]
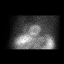
[frame 48/63]
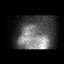
[frame 58/63]
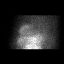

[Series 1: wbr_r-proj_st wbr rest · 6.40mm/px · 6 of 64 frames shown]
[frame 6/64]
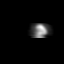
[frame 16/64]
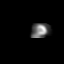
[frame 27/64]
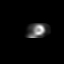
[frame 38/64]
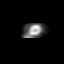
[frame 48/64]
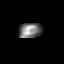
[frame 59/64]
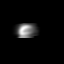

[Series 2: wbr stress ng · 6.40mm/px · 6 of 64 frames shown]
[frame 6/64]
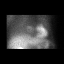
[frame 16/64]
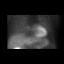
[frame 27/64]
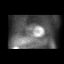
[frame 38/64]
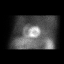
[frame 48/64]
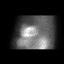
[frame 59/64]
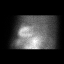

[Series 2: wbr_s-proj_st wbr stress ng · 6.40mm/px · 6 of 64 frames shown]
[frame 6/64]
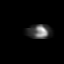
[frame 16/64]
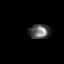
[frame 27/64]
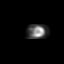
[frame 38/64]
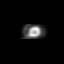
[frame 48/64]
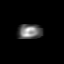
[frame 59/64]
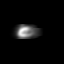

[24 of 24 positions shown; findings below may reference images not displayed]

Canned report from images found in remote index.

Refer to host system for actual result text.

## 2017-07-18 ENCOUNTER — Telehealth: Payer: Self-pay | Admitting: Internal Medicine

## 2017-07-18 NOTE — Telephone Encounter (Signed)
CPAP machines and supplies have been collected. Patient stated that he spoke with CY back during the summer and CY advised patient to bring machines and supplies to office.   Machines and supplies are at [employer] and she is aware.

## 2017-07-27 DIAGNOSIS — J449 Chronic obstructive pulmonary disease, unspecified: Secondary | ICD-10-CM | POA: Diagnosis not present

## 2017-07-27 DIAGNOSIS — E7849 Other hyperlipidemia: Secondary | ICD-10-CM | POA: Diagnosis not present

## 2017-07-27 DIAGNOSIS — I10 Essential (primary) hypertension: Secondary | ICD-10-CM | POA: Diagnosis not present

## 2017-07-27 DIAGNOSIS — E1140 Type 2 diabetes mellitus with diabetic neuropathy, unspecified: Secondary | ICD-10-CM | POA: Diagnosis not present

## 2017-07-27 DIAGNOSIS — F330 Major depressive disorder, recurrent, mild: Secondary | ICD-10-CM | POA: Diagnosis not present

## 2017-07-27 DIAGNOSIS — Z8546 Personal history of malignant neoplasm of prostate: Secondary | ICD-10-CM | POA: Diagnosis not present

## 2017-07-27 DIAGNOSIS — Z6829 Body mass index (BMI) 29.0-29.9, adult: Secondary | ICD-10-CM | POA: Diagnosis not present

## 2017-09-12 ENCOUNTER — Ambulatory Visit: Payer: Medicare Other | Admitting: Internal Medicine

## 2017-09-19 DIAGNOSIS — Z961 Presence of intraocular lens: Secondary | ICD-10-CM | POA: Diagnosis not present

## 2017-10-05 ENCOUNTER — Other Ambulatory Visit: Payer: Self-pay | Admitting: Cardiovascular Disease

## 2017-11-16 ENCOUNTER — Other Ambulatory Visit: Payer: Self-pay | Admitting: Cardiovascular Disease

## 2017-11-23 ENCOUNTER — Ambulatory Visit (INDEPENDENT_AMBULATORY_CARE_PROVIDER_SITE_OTHER): Payer: Medicare Other | Admitting: Cardiovascular Disease

## 2017-11-23 ENCOUNTER — Encounter: Payer: Self-pay | Admitting: Cardiovascular Disease

## 2017-11-23 VITALS — BP 132/74 | HR 68 | Ht 70.0 in | Wt 199.6 lb

## 2017-11-23 DIAGNOSIS — G4733 Obstructive sleep apnea (adult) (pediatric): Secondary | ICD-10-CM | POA: Diagnosis not present

## 2017-11-23 DIAGNOSIS — I2510 Atherosclerotic heart disease of native coronary artery without angina pectoris: Secondary | ICD-10-CM

## 2017-11-23 DIAGNOSIS — I2583 Coronary atherosclerosis due to lipid rich plaque: Secondary | ICD-10-CM

## 2017-11-23 DIAGNOSIS — I25119 Atherosclerotic heart disease of native coronary artery with unspecified angina pectoris: Secondary | ICD-10-CM

## 2017-11-23 DIAGNOSIS — E785 Hyperlipidemia, unspecified: Secondary | ICD-10-CM

## 2017-11-23 DIAGNOSIS — I951 Orthostatic hypotension: Secondary | ICD-10-CM

## 2017-11-23 MED ORDER — METOPROLOL SUCCINATE ER 25 MG PO TB24: ORAL_TABLET | ORAL | 3 refills | Status: DC

## 2017-11-23 NOTE — Patient Instructions (Signed)
Medication Instructions:   INCREASE metoprolol succinate (Toprol XL) to 37.5 mg daily  Follow-Up: Your physician wants you to follow-up in: 6 months with Dr. Claiborne Billings.  You will receive a reminder letter in the mail two months in advance. If you don't receive a letter, please call our office to schedule the follow-up appointment.   Any Other Special Instructions Will Be Listed Below (If Applicable).     If you need a refill on your cardiac medications before your next appointment, please call your pharmacy.

## 2017-11-24 ENCOUNTER — Encounter: Payer: Self-pay | Admitting: Cardiovascular Disease

## 2017-11-24 NOTE — Progress Notes (Signed)
Patient ID: Daniel Singleton, male   DOB: [birth year], 77 y.o.   MRN: [id number]    Primary M.D.: Dr. Jose Persia  HPI:  [name] is a 77 year old gentleman who presents for a 4 month follow-up cardiology evaluation.  [name] has a history of hypertension, hyperlipidemia, COPD, prior asbestos exposure, diabetes mellitus , and  prostate cancer. In January 2006, he underwent CABG surgery x4  in [facility] by Dr. Zorita Pang. I do not have the specifics of his bypass grafts. He had stress test in [place] in 2013 after he had experienced some recurrent chest pain episodes and a cardiac catheterization which showed mild blockages.  He also has a history of obstructive sleep apnea on CPAP therapy with 3 L of oxygen and is followed by Dr. Keturah Barre.  Daniel Singleton admits to mild shortness of breath.  He has a history of hyperlipidemia.   In the past he had been on isosorbide mononitrate therapy but stopped taking this because of libido concerns and potential need for medications for erectile function.  An echo Doppler study on 04/03/2013 showed an ejection fraction of 55-60%; mild tricuspid regurgitation, and grade 1 diastolic dysfunction. When I saw him, I recommended that we perform advanced lipid testing with NMR lipoprotein of since he has had low HDL levels and her recent cholesterol was 185 triglycerides 153 HDL 31 and LDL 123. At that time, I also started him on Ranexa 500 mg twice a day since he had discontinued his isosorbide. I added Zetia 10 mg to his atorvastatin and recommended that he undergo a followup with NMR lipoprotein assessment.  His NMR study was markedly abnormal. Specifically, his total cholesterol was 187 triglycerides 348 HDL 32 LDL cholesterol 85. However, he had markedly increased goal LDL particles at 1739 status LDL particle number was markedly increased at 2463. The patient tells me he did develop some myalgias on Lipitor and therefore for approximately the  last 3-4 weeks completely discontinued a torus that in therapy.  He was  hospitalized on December 26 through 08/21/2013 with a partial small bowel obstruction. He had experienced nausea and vomiting with diarrhea for 4 days prior to his admission and was found to have a partial mid-to high grade small bowel obstruction with a transition point in the right lower quadrant. He was cleared for surgery and if surgery was to be done he was to stop his Plavix and continue aspirin. Fortunately, his bowel obstruction resolved with medical management and surgical intervention was not necessary.  I'd seen him in January 2015 at which time he was off Plavix therapy and was not having any anginal symptoms on his current medical regimen.  He was hospitalized on 03/19/2014 through 03/21/2014 with ataxia.  He had complaints of vertigo with nausea, vomiting, double vision, numbness, and weakness.  A CT of the brain did not show acute intracranial abnormalities, but did show chronic atrophy and small vessel ischemic changes.  A 2-D echo Doppler study showed an EF of 45-50% without cardiac source of emboli.  Carotid studies revealed mild bilateral plaque with narrowing of 1-39% range.  Antegrade flow is vertebral artery.  Doppler of the brachial artery demonstrate triphasic waveforms.  He was felt possibly to have a posterior circulation TIA.  His aspirin 81 mg was increased to 325 mg.  and I last saw him in August 2015 due to his recent TIA.  I recommended reinitiation of Plavix 75 mg daily and reduced his aspirin dose back  to 81 mg.  When I saw several months ago he denied any significant chest pain but had experienced one short episode in February that was nonexertional.  However, he had noticed increasing exertional shortness of breath with activity.  He had stopped using his CPAP therapy and complained of increased fatigue.  He had not seen Dr. Annamaria Boots in some time and I recommended that when he saw him in July that  consideration for follow-up sleep study.  He at home.  At that time, he was planning to undergo cataract surgery sometime this fall.  Due to his exertional shortness of breath and his established coronary artery disease.  I recommended a nuclear perfusion study.  This was done on 02/28/2015 and was intermediate risk.  He developed mild ST segment depression and had occasional PACs and PVCs.  Scintigraphic images revealed a medium defect of moderate severity in the RCA distribution.  He also had an abnormal blood pressure response with hypotension during stress.  I was able to obtain the records from [place] to see if he had any significant inferior defect on prior nuclear studies.  Review his records dating back to 2006 including catheterization studies and subsequent nuclear imaging studies.  On the majority of his nuclear studies dating from 2007.  He was found to have mild fixed basal posterior lateral defect and there was some concern of possible attenuation artifact.  He was not found to have significant ischemia.  A nuclear study in 2011 was interpreted as indeterminate for ischemia with motion artifact.  His last study in [place] was in 2013 which again showed a fixed inferior defect and was felt that he had a low probability of ischemia.   He had seen a neurologist for evaluation of confusion, and staggering gait.  He also had an episode of dizziness yesterday and when he checked his blood pressure.  This was stable at 118/67 with a heart rate at 74.  Since I last saw him, he tells me he has not been using his CPAP for some time.  He dotes some  weakness when he stands abruptly.  He denies any recent chest pain.  He is unaware of palpitations.  He underwent an echo Doppler study on 10/28/2016 which showed an EF 55-60%.  There were no wall motion abnormalities.  There was grade 1 diastolic dysfunction.  PA pressure estimate was 35 mm.  On 10/29/2016, he underwent a nuclear stress test where his EF was  reported at 49%.  No ischemia was noted.  He also underwent carotid duplex imaging.  On 10/29/2016 which showed heterogeneous plaque bilaterally with 1-39%, mild bilateral carotid stenoses, not felt to be significant.  He had normal subclavian arteries, and patent vertebral arteries with antegrade flow.    When I  saw him in March 2018, he had significant orthostatic hypotension and his blood pressure dropped from 114/70 supine to 88/62 standing.  I reduced and ultimately discontinued isosorbide.  Off this therapy, he has not had any recurrent anginal symptomatology.  I started him on Midodrin at 2.5 mg initially twice a day.  I recommended compression stockings to both legs with at least 20-30 mm of support.  With this adjustment, he felt significantly improved.  At his follow-up office visit in May 2018 he was no longer orthostatic and had resolution of prior symptoms.    When I last saw him in September 2018 he was doing well and was without recurrent chest pain.  At that time, he was wondering  about trying to wean and possibly get off her nose seen.  He was concerned about the continued high cost.  As result he reduced renal dosing from 1000 mg twice a day to 500 mg twice a day.  However, on the reduced dose he noticed recurrent anginal symptomatology and as result is now back up to 1000 mill grams twice a day with complete resolution of prior symptoms.  He has not been using his CPAP therapy.  He has been sleeping in recliner is states he sleeps well.  He presents for follow-up evaluation.    Past Medical History:  Diagnosis Date  . Angina at rest [facility])   . Anxiety   . Arthritis   . Asthma   . Coronary artery disease    recently started seeing Dr. Claiborne Billings   . Depression   . Diabetes ([place])   . Elevated PSA   . Emphysema   . GERD (gastroesophageal reflux disease)   . Hx of radiation therapy 09/20/13- 11/14/13   prostate 7800 cGy 40 sessions, seminal vesicles 5600 cGy in 40 sessions  .  Hypercholesteremia   . Hypertension   . PONV (postoperative nausea and vomiting)   . Prostate cancer ([place]) 05/23/13   gleason 3+4=7, volume 31 cc  . Sleep apnea    c pap with oxygen at 2.5 L    Past Surgical History:  Procedure Laterality Date  . BACK SURGERY  2006   discectomy lumbar  . BACK SURGERY    . BACK SURGERY    . degloving rt leg injury  2000   had skin grafts  . FOOT SURGERY    . heart bypass  2006   [facility]  . HERNIA REPAIR  2008   x 3 w/mesh, bilat inguinla, umbilical  . PROSTATE BIOPSY N/A 05/23/2013   Procedure: BIOPSY TRANSRECTAL ULTRASONIC PROSTATE (TUBP);  Surgeon: Molli Hazard, MD;  Location: [facility];  Service: Urology;  Laterality: N/A;  prostate nerve block  . TONSILLECTOMY     w/adenoidectomy    Allergies  Allergen Reactions  . Niacin And Related Itching    Reaction to Rx strength only, OTC is okay    Current Outpatient Medications  Medication Sig Dispense Refill  . albuterol (PROVENTIL HFA;VENTOLIN HFA) 108 (90 Base) MCG/ACT inhaler Inhale 2 puffs into the lungs every 6 (six) hours as needed for wheezing. 3 Inhaler 3  . aspirin EC 81 MG tablet Take 1 tablet (81 mg total) by mouth daily. 90 tablet 3  . clopidogrel (PLAVIX) 75 MG tablet TAKE 1 TABLET BY MOUTH  DAILY 90 tablet 3  . ezetimibe (ZETIA) 10 MG tablet TAKE 1 TABLET BY MOUTH  EVERY MORNING 90 tablet 3  . Fluticasone-Umeclidin-Vilant (TRELEGY ELLIPTA) 100-62.5-25 MCG/INH AEPB Inhale 1 puff into the lungs daily. 180 each 3  . Ibuprofen-Diphenhydramine Cit ([employer] IBUPROFEN PM) 200-38 MG TABS Take by mouth. As needed    . metoprolol succinate (TOPROL-XL) 25 MG 24 hr tablet Take 37.5 mg (1.5 tablet) daily 135 tablet 3  . midodrine (PROAMATINE) 2.5 MG tablet TAKE 1 TABLET BY MOUTH TWO  TIMES DAILY WITH A MEAL 180 tablet 3  . niacin 500 MG tablet Take 500 mg by mouth daily. OTC    . nitroGLYCERIN (NITROSTAT) 0.3 MG SL tablet Place 0.3 mg under the tongue every 5 (five) minutes as needed for chest  pain.    . NON FORMULARY Take 2 capsules by mouth daily. Glucocil    . omega-3 acid ethyl esters (LOVAZA)  1 g capsule TAKE 1 CAPSULE BY MOUTH  DAILY 90 capsule 3  . RANEXA 1000 MG SR tablet TAKE 1 TABLET BY MOUTH TWO  TIMES DAILY 180 tablet 3  . rosuvastatin (CRESTOR) 20 MG tablet TAKE 1 TABLET BY MOUTH  DAILY 90 tablet 3   No current facility-administered medications for this visit.     Social history is notable in that he is married to his 2 children ages 60 and 76. He smoked one pack of cigarettes per day until 2000. He does drink occasional alcohol. He does walk  Family History  Problem Relation Age of Onset  . Heart disease Mother   . Heart attack Mother   . Cancer Sister   . Depression Sister    ROS General: Negative; No fevers, chills, or night sweats;  HEENT: Negative; No changes in vision or hearing, sinus congestion, difficulty swallowing Pulmonary: Positive for asbestosis exposure No cough, wheezing, shortness of breath, hemoptysis Cardiovascular: Negative; No chest pain, presyncope, syncope, palpitations GI: Negative; No nausea, vomiting, diarrhea, or abdominal pain GU: Positive for prostate CA No dysuria, hematuria, or difficulty voiding Musculoskeletal: Remote degloving injury; no myalgias, joint pain, or weakness Hematologic/Oncology: Negative; no easy bruising, bleeding Endocrine: Negative; no heat/cold intolerance; no diabetes Neuro: See history of present illness, recent ataxia, probable posterior circulation TIA Skin: Negative; No rashes or skin lesions Psychiatric: Negative; No behavioral problems, depression Sleep: Positive for obstructive sleep apnea with supplemental 3 L oxygen; he stopped using CPAP, apparently due to difficulties with his mask and potential mask leak; since he stopped using CPAP he notes more fatigue, daytime sleepiness,; no bruxism, restless legs, hypnogognic hallucinations, no cataplexy Other comprehensive 14 point system review is  negative.   PE BP 132/74   Pulse 68   Ht 5' 10"  (1.778 m)   Wt 199 lb 9.6 oz (90.5 kg)   BMI 28.64 kg/m    Repeat blood pressure by me was 136/76.  Wt Readings from Last 3 Encounters:  11/23/17 199 lb 9.6 oz (90.5 kg)  04/26/17 199 lb 3.2 oz (90.4 kg)  03/11/17 201 lb (91.2 kg)   General: Alert, oriented, no distress.  Skin: normal turgor, no rashes, warm and dry HEENT: Normocephalic, atraumatic. Pupils equal round and reactive to light; sclera anicteric; extraocular muscles intact;  Nose without nasal septal hypertrophy Mouth/Parynx benign; Mallinpatti scale 3 Neck: No JVD, no carotid bruits; normal carotid upstroke Lungs: clear to ausculatation and percussion; no wheezing or rales Chest wall: without tenderness to palpitation Heart: PMI not displaced, RRR, s1 s2 normal, 1/6 systolic murmur, no diastolic murmur, no rubs, gallops, thrills, or heaves Abdomen: soft, nontender; no hepatosplenomehaly, BS+; abdominal aorta nontender and not dilated by palpation. Back: no CVA tenderness Pulses 2+ Musculoskeletal: full range of motion, normal strength, no joint deformities Extremities: no clubbing cyanosis or edema, Homan's sign negative  Neurologic: grossly nonfocal; Cranial nerves grossly wnl Psychologic: Normal mood and affect   ECG (independently read by me): Normal sinus rhythm with mild sinus arrhythmia.  Heart rate 70 bpm.  Normal intervals.  September 2018 ECG (independently read by me): Normal sinus rhythm at 65 bpm with sinus arrhythmia.  QTc interval 416 ms.  PR interval 170 ms.  May 2018 ECG (independently read by me): Sinus rhythm with occasional PVC.  PR interval 182 ms.  QTc interval 435 ms.  March 2018 ECG (independently read by me): Normal sinus rhythm at 68 bpm, mild sinus arrhythmia.  Normal intervals.  No significant ST changes.  February 2018 ECG (independently read by me): Normal sinus rhythm with mild sinus arrhythmia.  Heart rate 69.  PAC.  No significant  ST changes.  Normal intervals.  December 2016 ECG (independently read by me): Normal sinus rhythm at 71 bpm with  PACs.  QTc interval normal at 439.  June 2016 ECG (independently read by me): Sinus rhythm with sinus arrhythmia, heart rate averaging 68 bpm.  Intervals normal.  December 2015 ECG (independently read by me): Normal sinus rhythm with mild sinus arrhythmia and PACs with ventricular rate at 76 bpm.  QTc interval 427 ms.  No significant ST segment changes.  August 2015 ECG (independently read by me): Sinus rhythm with sinus arrhythmia.  Nonspecific T changes.  QTc interval 421 ms.  08/30/2013 ECG independently read by me today shows normal sinus rhythm with sinus arrhythmia with a ventricular rate in the 80s. They're nonspecific ST-T changes.  Prior ECG in October 2014: Sinus rhythm with mild sinus arrhythmia at 60 beats per minute. PR interval 172 ms. QTC interval 384 ms.  LABS: BMP Latest Ref Rng & Units 10/15/2016 02/17/2015 03/20/2014  Glucose 65 - 99 mg/dL 147(H) 151(H) 152(H)  BUN 7 - 25 mg/dL 14 16 11   Creatinine 0.70 - 1.18 mg/dL 1.00 1.03 0.90  Sodium 135 - 146 mmol/L 137 140 139  Potassium 3.5 - 5.3 mmol/L 4.3 4.4 4.1  Chloride 98 - 110 mmol/L 102 101 99  CO2 20 - 31 mmol/L 26 28 24   Calcium 8.6 - 10.3 mg/dL 9.1 9.3 8.9   Hepatic Function Latest Ref Rng & Units 10/15/2016 02/17/2015 03/20/2014  Total Protein 6.1 - 8.1 g/dL 6.2 6.3 6.2  Albumin 3.6 - 5.1 g/dL 4.0 4.1 3.5  AST 10 - 35 U/L 19 18 19   ALT 9 - 46 U/L 19 16 18   Alk Phosphatase 40 - 115 U/L 29(L) 31(L) 46  Total Bilirubin 0.2 - 1.2 mg/dL 0.7 0.6 0.3   CBC Latest Ref Rng & Units 10/15/2016 02/17/2015 03/20/2014  WBC 3.8 - 10.8 K/uL 6.3 5.3 8.0  Hemoglobin 13.2 - 17.1 g/dL 14.1 14.1 13.2  Hematocrit 38.5 - 50.0 % 41.7 41.2 38.8(L)  Platelets 140 - 400 K/uL 125(L) 137(L) 130(L)   Lab Results  Component Value Date   MCV 91.9 10/15/2016   MCV 89.8 02/17/2015   MCV 88.2 03/20/2014   Lab Results  Component  Value Date   TSH 2.98 10/15/2016   Lab Results  Component Value Date   HGBA1C 6.5 (H) 03/20/2014   Lipid Panel     Component Value Date/Time   CHOL 98 10/15/2016 0858   CHOL 118 08/01/2013 0952   TRIG 67 10/15/2016 0858   TRIG 96 08/01/2013 0952   HDL 38 (L) 10/15/2016 0858   HDL 46 08/01/2013 0952   CHOLHDL 2.6 10/15/2016 0858   VLDL 13 10/15/2016 0858   LDLCALC 47 10/15/2016 0858   LDLCALC 53 08/01/2013 0952    Blood work done by Dr[facility] Ardeth Perfect at [facility] from 04/14/2015.  Renal function liver function studies were normal.  Glucose was elevated at 164.  Lipid studies revealed a total cholesterol 121, triglycerides 80, HDL 36, LDL 73.  Hemoglobin A1c was 6.5.  TSH was normal at 2.88.  Apo lipoprotein B was 71.   RADIOLOGY: No results found.  IMPRESSION:   1. Coronary artery disease involving native coronary artery of native heart with angina pectoris ([place])   2. Coronary artery disease due to lipid rich plaque   3.  Hyperlipidemia with target LDL less than 70   4. Orthostatic hypotension   5. Obstructive sleep apnea     ASSESSMENT AND PLAN: [name] is a 78 year old gentleman who has a history of CAD, hyperlipidemia, diabetes mellitus, COPD, and obstructive sleep apnea. He has a prior tobacco history but he quit on 01/17/1999. He is status post CABG surgery x4 in January 2006 and in 2013 in [place] had abnormal nuclear study.  Repeat catheterization demonstrated some mild blockages.  A prior nuclear study  raised the suspicion for ischemia in the RCA territory.  He had a mild hypotensive response to exercise and had mild ST changes.  He had an abnormal stress test in 2013 which led to his repeat cardiac catheterization.  On prior nuclear studies in [place] he has had consistent inferior defect which oftentimes has been interpreted as diaphragmatic attenuation.  I last saw him he was doing well and since he had not had hardly any episodes of chest pain over  several years try to reduce Ranexa due to cost issues.  However on the reduced dose he again experienced recurrent anginal type symptomatology which has completely resolved with further titration again back to 1000 mill grams twice a day.  He continues to be on  Toprol-XL 88m in addition to his aspirin,/Plavix.  He is no longer on isosorbide due to his prior orthostasis.  His blood pressure today is mildly increased.  In addition with his recent rest pain I have suggested a slight titration of Toprol back to 37.5 mg daily.  He is on Crestor 20 mg and Zetia 10 mg for hyperlipidemia with target LDL less than 70.  He continues to be on low vasa and I have suggested he can discontinue niacin.  He has seen CBaird Lyonswho has been treating his sleep apnea.  He essentially has stopped using this therapy and is now back sleeping in a recliner.  I again discussed with him the importance of resumption of CPAP use at present does not seem inclined to do so.  I will see him in 6 months for reevaluation  Time spent: 25 minutes  TTroy Sine MD, FPuyall[facility] Center4/11/2017 6:14 PM

## 2017-12-23 DIAGNOSIS — Z6829 Body mass index (BMI) 29.0-29.9, adult: Secondary | ICD-10-CM | POA: Diagnosis not present

## 2017-12-23 DIAGNOSIS — I10 Essential (primary) hypertension: Secondary | ICD-10-CM | POA: Diagnosis not present

## 2017-12-23 DIAGNOSIS — E119 Type 2 diabetes mellitus without complications: Secondary | ICD-10-CM | POA: Diagnosis not present

## 2018-02-06 DIAGNOSIS — C61 Malignant neoplasm of prostate: Secondary | ICD-10-CM | POA: Diagnosis not present

## 2018-02-13 DIAGNOSIS — L0292 Furuncle, unspecified: Secondary | ICD-10-CM | POA: Diagnosis not present

## 2018-02-13 DIAGNOSIS — N5201 Erectile dysfunction due to arterial insufficiency: Secondary | ICD-10-CM | POA: Diagnosis not present

## 2018-02-13 DIAGNOSIS — C61 Malignant neoplasm of prostate: Secondary | ICD-10-CM | POA: Diagnosis not present

## 2018-02-17 DIAGNOSIS — R739 Hyperglycemia, unspecified: Secondary | ICD-10-CM | POA: Diagnosis not present

## 2018-02-17 DIAGNOSIS — R5383 Other fatigue: Secondary | ICD-10-CM | POA: Diagnosis not present

## 2018-02-17 DIAGNOSIS — M129 Arthropathy, unspecified: Secondary | ICD-10-CM | POA: Diagnosis not present

## 2018-02-17 DIAGNOSIS — Z79899 Other long term (current) drug therapy: Secondary | ICD-10-CM | POA: Diagnosis not present

## 2018-02-17 DIAGNOSIS — M1380 Other specified arthritis, unspecified site: Secondary | ICD-10-CM | POA: Diagnosis not present

## 2018-02-20 DIAGNOSIS — L0292 Furuncle, unspecified: Secondary | ICD-10-CM | POA: Diagnosis not present

## 2018-03-03 DIAGNOSIS — I10 Essential (primary) hypertension: Secondary | ICD-10-CM | POA: Diagnosis not present

## 2018-03-03 DIAGNOSIS — E1165 Type 2 diabetes mellitus with hyperglycemia: Secondary | ICD-10-CM | POA: Diagnosis not present

## 2018-03-03 DIAGNOSIS — Z951 Presence of aortocoronary bypass graft: Secondary | ICD-10-CM | POA: Diagnosis not present

## 2018-03-03 DIAGNOSIS — E7800 Pure hypercholesterolemia, unspecified: Secondary | ICD-10-CM | POA: Diagnosis not present

## 2018-03-21 DIAGNOSIS — E119 Type 2 diabetes mellitus without complications: Secondary | ICD-10-CM | POA: Diagnosis not present

## 2018-03-21 DIAGNOSIS — Z8673 Personal history of transient ischemic attack (TIA), and cerebral infarction without residual deficits: Secondary | ICD-10-CM | POA: Diagnosis not present

## 2018-03-21 DIAGNOSIS — Z961 Presence of intraocular lens: Secondary | ICD-10-CM | POA: Diagnosis not present

## 2018-03-21 DIAGNOSIS — H3554 Dystrophies primarily involving the retinal pigment epithelium: Secondary | ICD-10-CM | POA: Diagnosis not present

## 2018-03-23 DIAGNOSIS — H43813 Vitreous degeneration, bilateral: Secondary | ICD-10-CM | POA: Diagnosis not present

## 2018-03-23 DIAGNOSIS — H3554 Dystrophies primarily involving the retinal pigment epithelium: Secondary | ICD-10-CM | POA: Diagnosis not present

## 2018-03-23 DIAGNOSIS — H35423 Microcystoid degeneration of retina, bilateral: Secondary | ICD-10-CM | POA: Diagnosis not present

## 2018-03-23 DIAGNOSIS — H2513 Age-related nuclear cataract, bilateral: Secondary | ICD-10-CM | POA: Diagnosis not present

## 2018-04-03 DIAGNOSIS — E1165 Type 2 diabetes mellitus with hyperglycemia: Secondary | ICD-10-CM | POA: Diagnosis not present

## 2018-04-03 DIAGNOSIS — E7800 Pure hypercholesterolemia, unspecified: Secondary | ICD-10-CM | POA: Diagnosis not present

## 2018-04-03 DIAGNOSIS — I10 Essential (primary) hypertension: Secondary | ICD-10-CM | POA: Diagnosis not present

## 2018-05-15 DIAGNOSIS — H353132 Nonexudative age-related macular degeneration, bilateral, intermediate dry stage: Secondary | ICD-10-CM | POA: Diagnosis not present

## 2018-05-15 DIAGNOSIS — H3540 Unspecified peripheral retinal degeneration: Secondary | ICD-10-CM | POA: Diagnosis not present

## 2018-05-15 DIAGNOSIS — H3554 Dystrophies primarily involving the retinal pigment epithelium: Secondary | ICD-10-CM | POA: Diagnosis not present

## 2018-05-15 DIAGNOSIS — Z961 Presence of intraocular lens: Secondary | ICD-10-CM | POA: Diagnosis not present

## 2018-05-30 DIAGNOSIS — Z23 Encounter for immunization: Secondary | ICD-10-CM | POA: Diagnosis not present

## 2018-06-06 ENCOUNTER — Telehealth: Payer: Self-pay | Admitting: Cardiovascular Disease

## 2018-06-06 NOTE — Telephone Encounter (Signed)
Spoke with pt's wife. DPR on file. Pt has been having increased sob over the last couple of weeks. One day 2 weeks ago the pt was having chest pain and took Nitro x3. Pain was releived with the 3rd Nitro. Pt sis not call 911 or go to the ED. Pt reports a second episode of chest pain while out shopping. Pt describes discomfort as a tightening in his chest. Pain was relieved with one Nitro. Pt sts that he has not had any reoccurrence of chest pain, but he just doesn't feel well. Offered to schedule the pt with an APP today. Pt refused and only wants to see Dr.Kelly.  Spoke with Dr.Kelly's nurse Hayley. Per Hayley ok to schedule pt on 10/21 @ 10am. Pt aware of appt and appreciative. Adv pt to call 911 if chest pain reoccurs that requires Nitro x3. He should call the office to give an update if he is having to use Nitro frequently. Pt agreeable with plan and verbalized understanding.

## 2018-06-06 NOTE — Telephone Encounter (Signed)
New Message   Pt c/o of Chest Pain: STAT if CP now or developed within 24 hours  1. Are you having CP right now? No  2. Are you experiencing any other symptoms (ex. SOB, nausea, vomiting, sweating)? SOB  3. How long have you been experiencing CP? Since 9/30  4. Is your CP continuous or coming and going? continuous   5. Have you taken Nitroglycerin? yes ?

## 2018-06-12 ENCOUNTER — Encounter: Payer: Self-pay | Admitting: Cardiovascular Disease

## 2018-06-12 ENCOUNTER — Ambulatory Visit (INDEPENDENT_AMBULATORY_CARE_PROVIDER_SITE_OTHER): Payer: Medicare Other | Admitting: Cardiovascular Disease

## 2018-06-12 VITALS — BP 113/64 | HR 66 | Ht 70.0 in | Wt 199.6 lb

## 2018-06-12 DIAGNOSIS — I2583 Coronary atherosclerosis due to lipid rich plaque: Secondary | ICD-10-CM

## 2018-06-12 DIAGNOSIS — Z951 Presence of aortocoronary bypass graft: Secondary | ICD-10-CM

## 2018-06-12 DIAGNOSIS — I951 Orthostatic hypotension: Secondary | ICD-10-CM

## 2018-06-12 DIAGNOSIS — E785 Hyperlipidemia, unspecified: Secondary | ICD-10-CM | POA: Diagnosis not present

## 2018-06-12 DIAGNOSIS — I2510 Atherosclerotic heart disease of native coronary artery without angina pectoris: Secondary | ICD-10-CM

## 2018-06-12 DIAGNOSIS — G4733 Obstructive sleep apnea (adult) (pediatric): Secondary | ICD-10-CM

## 2018-06-12 MED ORDER — NITROGLYCERIN 0.3 MG SL SUBL: 0.3000 mg | SUBLINGUAL_TABLET | SUBLINGUAL | 3 refills | Status: DC | PRN

## 2018-06-12 MED ORDER — METOPROLOL SUCCINATE ER 25 MG PO TB24: ORAL_TABLET | ORAL | 3 refills | Status: DC

## 2018-06-12 NOTE — Progress Notes (Signed)
Patient ID: Daniel Singleton, male   DOB: 05-Feb-1941, 77 y.o.   MRN: [id number]    Primary M.D.: Dr. Jose Persia  HPI:  Daniel Singleton is a 77 year old gentleman who presents for a 6 month follow-up cardiology evaluation.  Mr. Gorter has a history of hypertension, hyperlipidemia, COPD, prior asbestos exposure, diabetes mellitus , and  prostate cancer. In January 2006, he underwent CABG surgery x4  in [facility] by Dr. Zorita Pang. I do not have the specifics of his bypass grafts. He had stress test in [place] in 2013 after he had experienced some recurrent chest pain episodes and a cardiac catheterization which showed mild blockages.  He also has a history of obstructive sleep apnea on CPAP therapy with 3 L of oxygen and is followed by Dr. Keturah Barre.  Mr. Aloi admits to mild shortness of breath.  He has a history of hyperlipidemia.   In the past he had been on isosorbide mononitrate therapy but stopped taking this because of libido concerns and potential need for medications for erectile function.  An echo Doppler study on 04/03/2013 showed an ejection fraction of 55-60%; mild tricuspid regurgitation, and grade 1 diastolic dysfunction. When I saw him, I recommended that we perform advanced lipid testing with NMR lipoprotein of since he has had low HDL levels and her recent cholesterol was 185 triglycerides 153 HDL 31 and LDL 123. At that time, I also started him on Ranexa 500 mg twice a day since he had discontinued his isosorbide. I added Zetia 10 mg to his atorvastatin and recommended that he undergo a followup with NMR lipoprotein assessment.  His NMR study was markedly abnormal. Specifically, his total cholesterol was 187 triglycerides 348 HDL 32 LDL cholesterol 85. However, he had markedly increased goal LDL particles at 1739 status LDL particle number was markedly increased at 2463. The patient tells me he did develop some myalgias on Lipitor and therefore for approximately the  last 3-4 weeks completely discontinued a torus that in therapy.  He was  hospitalized on December 26 through 08/21/2013 with a partial small bowel obstruction. He had experienced nausea and vomiting with diarrhea for 4 days prior to his admission and was found to have a partial mid-to high grade small bowel obstruction with a transition point in the right lower quadrant. He was cleared for surgery and if surgery was to be done he was to stop his Plavix and continue aspirin. Fortunately, his bowel obstruction resolved with medical management and surgical intervention was not necessary.  I'd seen him in January 2015 at which time he was off Plavix therapy and was not having any anginal symptoms on his current medical regimen.  He was hospitalized on 03/19/2014 through 03/21/2014 with ataxia.  He had complaints of vertigo with nausea, vomiting, double vision, numbness, and weakness.  A CT of the brain did not show acute intracranial abnormalities, but did show chronic atrophy and small vessel ischemic changes.  A 2-D echo Doppler study showed an EF of 45-50% without cardiac source of emboli.  Carotid studies revealed mild bilateral plaque with narrowing of 1-39% range.  Antegrade flow is vertebral artery.  Doppler of the brachial artery demonstrate triphasic waveforms.  He was felt possibly to have a posterior circulation TIA.  His aspirin 81 mg was increased to 325 mg.  and I last saw him in August 2015 due to his recent TIA.  I recommended reinitiation of Plavix 75 mg daily and reduced his aspirin dose back  to 81 mg.  When I saw several months ago he denied any significant chest pain but had experienced one short episode in February that was nonexertional.  However, he had noticed increasing exertional shortness of breath with activity.  He had stopped using his CPAP therapy and complained of increased fatigue.  He had not seen Dr. Annamaria Boots in some time and I recommended that when he saw him in July that  consideration for follow-up sleep study.  He at home.  At that time, he was planning to undergo cataract surgery sometime this fall.  Due to his exertional shortness of breath and his established coronary artery disease.  I recommended a nuclear perfusion study.  This was done on 02/28/2015 and was intermediate risk.  He developed mild ST segment depression and had occasional PACs and PVCs.  Scintigraphic images revealed a medium defect of moderate severity in the RCA distribution.  He also had an abnormal blood pressure response with hypotension during stress.  I was able to obtain the records from [place] to see if he had any significant inferior defect on prior nuclear studies.  Review his records dating back to 2006 including catheterization studies and subsequent nuclear imaging studies.  On the majority of his nuclear studies dating from 2007.  He was found to have mild fixed basal posterior lateral defect and there was some concern of possible attenuation artifact.  He was not found to have significant ischemia.  A nuclear study in 2011 was interpreted as indeterminate for ischemia with motion artifact.  His last study in [place] was in 2013 which again showed a fixed inferior defect and was felt that he had a low probability of ischemia.   He had seen a neurologist for evaluation of confusion, and staggering gait.  He also had an episode of dizziness yesterday and when he checked his blood pressure.  This was stable at 118/67 with a heart rate at 74.  Since I last saw him, he tells me he has not been using his CPAP for some time.  He dotes some  weakness when he stands abruptly.  He denies any recent chest pain.  He is unaware of palpitations.  He underwent an echo Doppler study on 10/28/2016 which showed an EF 55-60%.  There were no wall motion abnormalities.  There was grade 1 diastolic dysfunction.  PA pressure estimate was 35 mm.  On 10/29/2016, he underwent a nuclear stress test where his EF was  reported at 49%.  No ischemia was noted.  He also underwent carotid duplex imaging.  On 10/29/2016 which showed heterogeneous plaque bilaterally with 1-39%, mild bilateral carotid stenoses, not felt to be significant.  He had normal subclavian arteries, and patent vertebral arteries with antegrade flow.    When I  saw him in March 2018, he had significant orthostatic hypotension and his blood pressure dropped from 114/70 supine to 88/62 standing.  I reduced and ultimately discontinued isosorbide.  Off this therapy, he has not had any recurrent anginal symptomatology.  I started him on Midodrin at 2.5 mg initially twice a day.  I recommended compression stockings to both legs with at least 20-30 mm of support.  With this adjustment, he felt significantly improved.  At his follow-up office visit in May 2018 he was no longer orthostatic and had resolution of prior symptoms.    When I saw him in September 2018 he was doing well and was without recurrent chest pain.  At that time, he was wondering about  trying to wean and possibly get off ranolazine.  He was concerned about the continued high cost.  As result he reduced renal dosing from 1000 mg twice a day to 500 mg twice a day.  However, on the reduced dose he noticed recurrent anginal symptomatology and as result is now back up to 1000 mg twice a day with complete resolution of prior symptoms.  He was not been using his CPAP therapy and was been sleeping in recliner.  I last saw him in April 2019 at which time I stressed the importance of trying to resume CPAP therapy.   His blood pressure was increased and I recommended slight titration of Toprol back to 37.5 mg daily.  He has been on Crestor 20 and Zetia 10 mg for hyperlipidemia.  Suggested he discontinue niacin but continue lovaza.  He has not used CPAP therapy and has no intention at present to reconsider this option.  He admits to some shortness of breath with activity.  He has only rare episodes of chest  pain which is responsive to nitrate when taken.  He continues to sleep in a recliner.  He does snore.  He states he has not used CPAP in at least 4 years.  He admits to fatigue.  He presents for evaluation.  Past Medical History:  Diagnosis Date  . [facility])   . Anxiety   . Arthritis   . Asthma   . Coronary artery disease    recently started seeing Dr. Claiborne Billings   . Depression   . Diabetes ([place])   . Elevated PSA   . Emphysema   . GERD (gastroesophageal reflux disease)   . Hx of radiation therapy 09/20/13- 11/14/13   prostate 7800 cGy 40 sessions, seminal vesicles 5600 cGy in 40 sessions  . Hypercholesteremia   . Hypertension   . PONV (postoperative nausea and vomiting)   . Prostate cancer ([place]) 05/23/13   gleason 3+4=7, volume 31 cc  . Sleep apnea    c pap with oxygen at 2.5 L    Past Surgical History:  Procedure Laterality Date  . BACK SURGERY  2006   discectomy lumbar  . BACK SURGERY    . BACK SURGERY    . degloving rt leg injury  2000   had skin grafts  . FOOT SURGERY    . heart bypass  2006   [place]  . HERNIA REPAIR  2008   x 3 w/mesh, bilat inguinla, umbilical  . PROSTATE BIOPSY N/A 05/23/2013   Procedure: BIOPSY TRANSRECTAL ULTRASONIC PROSTATE (TUBP);  Surgeon: Molli Hazard, MD;  Location: [facility];  Service: Urology;  Laterality: N/A;  prostate nerve block  . TONSILLECTOMY     w/adenoidectomy    Allergies  Allergen Reactions  . Niacin And Related Itching    Reaction to Rx strength only, OTC is okay    Current Outpatient Medications  Medication Sig Dispense Refill  . albuterol (PROVENTIL HFA;VENTOLIN HFA) 108 (90 Base) MCG/ACT inhaler Inhale 2 puffs into the lungs every 6 (six) hours as needed for wheezing. 3 Inhaler 3  . aspirin EC 81 MG tablet Take 1 tablet (81 mg total) by mouth daily. 90 tablet 3  . budesonide-formoterol (SYMBICORT) 160-4.5 MCG/ACT inhaler Inhale 2 puffs into the lungs 2 (two) times daily.    . clopidogrel (PLAVIX) 75 MG  tablet TAKE 1 TABLET BY MOUTH  DAILY 90 tablet 3  . ezetimibe (ZETIA) 10 MG tablet TAKE 1 TABLET BY MOUTH  EVERY  MORNING 90 tablet 3  . Ibuprofen-Diphenhydramine Cit ([employer] IBUPROFEN PM) 200-38 MG TABS Take by mouth. As needed    . metFORMIN (GLUCOPHAGE) 500 MG tablet Take 500 mg by mouth 2 (two) times daily.  2  . metoprolol succinate (TOPROL-XL) 25 MG 24 hr tablet Take 12.5 mg in the AM and 25 mg in the PM (total 37.5 mg daily) 135 tablet 3  . midodrine (PROAMATINE) 2.5 MG tablet TAKE 1 TABLET BY MOUTH TWO  TIMES DAILY WITH A MEAL 180 tablet 3  . Multiple Vitamins-Minerals (PRESERVISION AREDS 2+MULTI VIT PO) Take by mouth.    . nitroGLYCERIN (NITROSTAT) 0.3 MG SL tablet Place 1 tablet (0.3 mg total) under the tongue every 5 (five) minutes as needed for chest pain. 25 tablet 3  . omega-3 acid ethyl esters (LOVAZA) 1 g capsule TAKE 1 CAPSULE BY MOUTH  DAILY 90 capsule 3  . RANEXA 1000 MG SR tablet TAKE 1 TABLET BY MOUTH TWO  TIMES DAILY 180 tablet 3  . rosuvastatin (CRESTOR) 20 MG tablet TAKE 1 TABLET BY MOUTH  DAILY 90 tablet 3   No current facility-administered medications for this visit.     Social history is notable in that he is married to his 2 children ages 16 and 6. He smoked one pack of cigarettes per day until 2000. He does drink occasional alcohol. He does walk  Family History  Problem Relation Age of Onset  . Heart disease Mother   . Heart attack Mother   . Cancer Sister   . Depression Sister    ROS General: Negative; No fevers, chills, or night sweats;  HEENT: Negative; No changes in vision or hearing, sinus congestion, difficulty swallowing Pulmonary: Positive for asbestosis exposure No cough, wheezing, shortness of breath, hemoptysis Cardiovascular: Negative; No chest pain, presyncope, syncope, palpitations GI: Negative; No nausea, vomiting, diarrhea, or abdominal pain GU: Positive for prostate CA No dysuria, hematuria, or difficulty voiding Musculoskeletal: Remote  degloving injury; no myalgias, joint pain, or weakness Hematologic/Oncology: Negative; no easy bruising, bleeding Endocrine: Negative; no heat/cold intolerance; no diabetes Neuro: See history of present illness, recent ataxia, probable posterior circulation TIA Skin: Negative; No rashes or skin lesions Psychiatric: Negative; No behavioral problems, depression Sleep: Positive for obstructive sleep apnea with supplemental 3 L oxygen; he stopped using CPAP, apparently due to difficulties with his mask and potential mask leak; since he stopped using CPAP he notes more fatigue, daytime sleepiness,; no bruxism, restless legs, hypnogognic hallucinations, no cataplexy Other comprehensive 14 point system review is negative.   PE BP 113/64   Pulse 66   Ht _0  (1.778 m)   Wt 199 lb 9.6 oz (90.5 kg)   BMI 28.64 kg/m    Repeat blood pressure by me was 12/68 supine and this dropped to 94/60 standing.  Wt Readings from Last 3 Encounters:  06/12/18 199 lb 9.6 oz (90.5 kg)  11/23/17 199 lb 9.6 oz (90.5 kg)  04/26/17 199 lb 3.2 oz (90.4 kg)   General: Alert, oriented, no distress.  Skin: normal turgor, no rashes, warm and dry HEENT: Normocephalic, atraumatic. Pupils equal round and reactive to light; sclera anicteric; extraocular muscles intact;  Nose without nasal septal hypertrophy Mouth/Parynx benign; Mallinpatti scale 3 Neck: No JVD, no carotid bruits; normal carotid upstroke Lungs: clear to ausculatation and percussion; no wheezing or rales Chest wall: without tenderness to palpitation Heart: PMI not displaced, RRR, s1 s2 normal, 1/6 systolic murmur, no diastolic murmur, no rubs, gallops, thrills, or heaves Abdomen:  soft, nontender; no hepatosplenomehaly, BS+; abdominal aorta nontender and not dilated by palpation. Back: no CVA tenderness Pulses 2+ Musculoskeletal: full range of motion, normal strength, no joint deformities Extremities: no clubbing cyanosis or edema, Homan's sign negative   Neurologic: grossly nonfocal; Cranial nerves grossly wnl Psychologic: Normal mood and affect   ECG (independently read by me): Normal sinus rhythm with mild sinus arrhythmia.  Normal intervals.  No ST segment changes.  April 2019 ECG (independently read by me): Normal sinus rhythm with mild sinus arrhythmia.  Heart rate 70 bpm.  Normal intervals.  September 2018 ECG (independently read by me): Normal sinus rhythm at 65 bpm with sinus arrhythmia.  QTc interval 416 ms.  PR interval 170 ms.  May 2018 ECG (independently read by me): Sinus rhythm with occasional PVC.  PR interval 182 ms.  QTc interval 435 ms.  March 2018 ECG (independently read by me): Normal sinus rhythm at 68 bpm, mild sinus arrhythmia.  Normal intervals.  No significant ST changes.  February 2018 ECG (independently read by me): Normal sinus rhythm with mild sinus arrhythmia.  Heart rate 69.  PAC.  No significant ST changes.  Normal intervals.  December 2016 ECG (independently read by me): Normal sinus rhythm at 71 bpm with  PACs.  QTc interval normal at 439.  June 2016 ECG (independently read by me): Sinus rhythm with sinus arrhythmia, heart rate averaging 68 bpm.  Intervals normal.  December 2015 ECG (independently read by me): Normal sinus rhythm with mild sinus arrhythmia and PACs with ventricular rate at 76 bpm.  QTc interval 427 ms.  No significant ST segment changes.  August 2015 ECG (independently read by me): Sinus rhythm with sinus arrhythmia.  Nonspecific T changes.  QTc interval 421 ms.  08/30/2013 ECG independently read by me today shows normal sinus rhythm with sinus arrhythmia with a ventricular rate in the 80s. They're nonspecific ST-T changes.  Prior ECG in October 2014: Sinus rhythm with mild sinus arrhythmia at 60 beats per minute. PR interval 172 ms. QTC interval 384 ms.  LABS: BMP Latest Ref Rng & Units 10/15/2016 02/17/2015 03/20/2014  Glucose 65 - 99 mg/dL 147(H) 151(H) 152(H)  BUN 7 - 25 mg/dL _0 Creatinine 0.70 - 1.18 mg/dL 1.00 1.03 0.90  Sodium 135 - 146 mmol/L 137 140 139  Potassium 3.5 - 5.3 mmol/L 4.3 4.4 4.1  Chloride 98 - 110 mmol/L 102 101 99  CO2 20 - 31 mmol/L _1 Calcium 8.6 - 10.3 mg/dL 9.1 9.3 8.9   Hepatic Function Latest Ref Rng & Units 10/15/2016 02/17/2015 03/20/2014  Total Protein 6.1 - 8.1 g/dL 6.2 6.3 6.2  Albumin 3.6 - 5.1 g/dL 4.0 4.1 3.5  AST 10 - 35 U/L _2 ALT 9 - 46 U/L _3 Alk Phosphatase 40 - 115 U/L 29(L) 31(L) 46  Total Bilirubin 0.2 - 1.2 mg/dL 0.7 0.6 0.3   CBC Latest Ref Rng & Units 10/15/2016 02/17/2015 03/20/2014  WBC 3.8 - 10.8 K/uL 6.3 5.3 8.0  Hemoglobin 13.2 - 17.1 g/dL 14.1 14.1 13.2  Hematocrit 38.5 - 50.0 % 41.7 41.2 38.8(L)  Platelets 140 - 400 K/uL 125(L) 137(L) 130(L)   Lab Results  Component Value Date   MCV 91.9 10/15/2016   MCV 89.8 02/17/2015   MCV 88.2 03/20/2014   Lab Results  Component Value Date   TSH 2.98 10/15/2016   Lab Results  Component Value Date   HGBA1C 6.5 (  H) 03/20/2014   Lipid Panel     Component Value Date/Time   CHOL 98 10/15/2016 0858   CHOL 118 08/01/2013 0952   TRIG 67 10/15/2016 0858   TRIG 96 08/01/2013 0952   HDL 38 (L) 10/15/2016 0858   HDL 46 08/01/2013 0952   CHOLHDL 2.6 10/15/2016 0858   VLDL 13 10/15/2016 0858   LDLCALC 47 10/15/2016 0858   LDLCALC 53 08/01/2013 0952    Blood work done by Dr. Ardeth Perfect at [facility] from 04/14/2015.  Renal function liver function studies were normal.  Glucose was elevated at 164.  Lipid studies revealed a total cholesterol 121, triglycerides 80, HDL 36, LDL 73.  Hemoglobin A1c was 6.5.  TSH was normal at 2.88.  Apo lipoprotein B was 71.   RADIOLOGY: No results found.  IMPRESSION:   1. Coronary artery disease due to lipid rich plaque   2. Hx of CABG   3. Hyperlipidemia with target LDL less than 70   4. Orthostatic hypotension   5. Obstructive sleep apnea     ASSESSMENT AND PLAN: Mr. Demetres Prochnow is a 77 year old  gentleman who has a history of CAD, hyperlipidemia, diabetes mellitus, COPD, and obstructive sleep apnea. He has a prior tobacco history but he quit on 01/17/1999. He is status post CABG surgery x4 in January 2006 and in 2013 in [place] had abnormal nuclear study.  Repeat catheterization demonstrated some mild blockages.  A prior nuclear study  raised the suspicion for ischemia in the RCA territory.  He had a mild hypotensive response to exercise and had mild ST changes.  He had an abnormal stress test in 2013 which led to his repeat cardiac catheterization.  On prior nuclear studies in [place] he has had consistent inferior defect which oftentimes has been interpreted as diaphragmatic attenuation.  Presently, he has done well with reference anginal symptomatology on his regimen consisting of Ranexa 1000 mg twice a day, metoprolol 37.5 mg which he has been taking in the morning, in addition to aspirin and Plavix.  He has only noticed a very rare episode of isolated chest pain.  However, on exam today he is mildly orthostatic.  He has noticed some daytime fatigue.  I am suggesting that he change his Toprol and take 12.5 mg in the morning but he can take 25 mg at bedtime.  I again discussed the adverse consequences of untreated sleep apnea with reference to his cardiovascular health.  He has not used CPAP in over 4 years and previously had been cared for by Dr. Annamaria Boots.  Despite much discussion at present he has no interest in resuming therapy.  He is on rosuvastatin 20 mg with target LDL less than 70 and most recent lipid panel 1 year ago showed an LDL of 61.  At that time his triglycerides were 71 and I discontinued niacin.  He denies bleeding with dual antiplatelet therapy which I have recommended he continue indefinitely.  See him in 6 months for reevaluation or sooner if problems arise.   Time Spent: 25 minutes  Troy Sine, MD, [facility] 06/14/2018 5:46 PM

## 2018-06-12 NOTE — Telephone Encounter (Signed)
Patient was added onto my office and seen today June 12, 2018

## 2018-06-12 NOTE — Patient Instructions (Signed)
Medication Instructions:  Change metoprolol succinate (Toprol XL) to 12.5 mg (1/2 tablet) in the AM and 25 mg (1 tablet) in the PM  If you need a refill on your cardiac medications before your next appointment, please call your pharmacy.   Follow-Up: At [facility], you and your health needs are our priority.  As part of our continuing mission to provide you with exceptional heart care, we have created designated Provider Care Teams.  These Care Teams include your primary Cardiologist (physician) and Advanced Practice Providers (APPs -  Physician Assistants and Nurse Practitioners) who all work together to provide you with the care you need, when you need it. You will need a follow up appointment in 6 months.  Please call our office 2 months in advance to schedule this appointment.  You may see Dr. Claiborne Billings or one of the following Advanced Practice Providers on your designated Care Team: [place], [place] . Fabian Sharp, PA-C

## 2018-06-14 ENCOUNTER — Encounter: Payer: Self-pay | Admitting: Cardiovascular Disease

## 2018-06-24 ENCOUNTER — Other Ambulatory Visit: Payer: Self-pay | Admitting: Internal Medicine

## 2018-07-04 DIAGNOSIS — R5383 Other fatigue: Secondary | ICD-10-CM | POA: Diagnosis not present

## 2018-07-04 DIAGNOSIS — Z0000 Encounter for general adult medical examination without abnormal findings: Secondary | ICD-10-CM | POA: Diagnosis not present

## 2018-07-04 DIAGNOSIS — E7800 Pure hypercholesterolemia, unspecified: Secondary | ICD-10-CM | POA: Diagnosis not present

## 2018-07-04 DIAGNOSIS — I10 Essential (primary) hypertension: Secondary | ICD-10-CM | POA: Diagnosis not present

## 2018-07-04 DIAGNOSIS — E1165 Type 2 diabetes mellitus with hyperglycemia: Secondary | ICD-10-CM | POA: Diagnosis not present

## 2018-07-04 DIAGNOSIS — F329 Major depressive disorder, single episode, unspecified: Secondary | ICD-10-CM | POA: Diagnosis not present

## 2018-07-26 DIAGNOSIS — L603 Nail dystrophy: Secondary | ICD-10-CM | POA: Diagnosis not present

## 2018-07-26 DIAGNOSIS — L821 Other seborrheic keratosis: Secondary | ICD-10-CM | POA: Diagnosis not present

## 2018-07-26 DIAGNOSIS — L814 Other melanin hyperpigmentation: Secondary | ICD-10-CM | POA: Diagnosis not present

## 2018-07-26 DIAGNOSIS — I781 Nevus, non-neoplastic: Secondary | ICD-10-CM | POA: Diagnosis not present

## 2018-07-31 ENCOUNTER — Other Ambulatory Visit: Payer: Self-pay | Admitting: Cardiovascular Disease

## 2018-07-31 NOTE — Telephone Encounter (Signed)
Rx request sent to pharmacy.  

## 2018-08-03 DIAGNOSIS — E1165 Type 2 diabetes mellitus with hyperglycemia: Secondary | ICD-10-CM | POA: Diagnosis not present

## 2018-08-03 DIAGNOSIS — E7800 Pure hypercholesterolemia, unspecified: Secondary | ICD-10-CM | POA: Diagnosis not present

## 2018-08-03 DIAGNOSIS — J449 Chronic obstructive pulmonary disease, unspecified: Secondary | ICD-10-CM | POA: Diagnosis not present

## 2018-08-03 DIAGNOSIS — F329 Major depressive disorder, single episode, unspecified: Secondary | ICD-10-CM | POA: Diagnosis not present

## 2018-08-28 ENCOUNTER — Encounter: Payer: Self-pay | Admitting: Cardiovascular Disease

## 2018-08-28 ENCOUNTER — Ambulatory Visit (INDEPENDENT_AMBULATORY_CARE_PROVIDER_SITE_OTHER): Payer: Medicare Other | Admitting: Cardiovascular Disease

## 2018-08-28 VITALS — BP 138/68 | HR 73 | Ht 70.0 in | Wt 200.0 lb

## 2018-08-28 DIAGNOSIS — G4733 Obstructive sleep apnea (adult) (pediatric): Secondary | ICD-10-CM | POA: Diagnosis not present

## 2018-08-28 DIAGNOSIS — Z951 Presence of aortocoronary bypass graft: Secondary | ICD-10-CM

## 2018-08-28 DIAGNOSIS — I25810 Atherosclerosis of coronary artery bypass graft(s) without angina pectoris: Secondary | ICD-10-CM | POA: Diagnosis not present

## 2018-08-28 DIAGNOSIS — I2583 Coronary atherosclerosis due to lipid rich plaque: Secondary | ICD-10-CM

## 2018-08-28 DIAGNOSIS — I2510 Atherosclerotic heart disease of native coronary artery without angina pectoris: Secondary | ICD-10-CM | POA: Diagnosis not present

## 2018-08-28 DIAGNOSIS — E785 Hyperlipidemia, unspecified: Secondary | ICD-10-CM | POA: Diagnosis not present

## 2018-08-28 DIAGNOSIS — M6208 Separation of muscle (nontraumatic), other site: Secondary | ICD-10-CM

## 2018-08-28 NOTE — Progress Notes (Signed)
Patient ID: ANAV LAMMERT, male   DOB: 07-25-1941, 78 y.o.   MRN: [id number]    Primary M.D.: Dr. Jose Persia  HPI:  Mr. Kimoni Pagliarulo is a 78 year old gentleman who presents for a 3 month follow-up cardiology evaluation.  Mr. Armond has a history of hypertension, hyperlipidemia, COPD, prior asbestos exposure, diabetes mellitus , and  prostate cancer. In January 2006, he underwent CABG surgery x4  in [facility] by Dr. Zorita Pang. I do not have the specifics of his bypass grafts. He had stress test in [place] in 2013 after he had experienced some recurrent chest pain episodes and a cardiac catheterization which showed mild blockages.  He also has a history of obstructive sleep apnea on CPAP therapy with 3 L of oxygen and is followed by Dr. Keturah Barre.  Mr. Rindfleisch admits to mild shortness of breath.  He has a history of hyperlipidemia.   In the past he had been on isosorbide mononitrate therapy but stopped taking this because of libido concerns and potential need for medications for erectile function.  An echo Doppler study on 04/03/2013 showed an ejection fraction of 55-60%; mild tricuspid regurgitation, and grade 1 diastolic dysfunction. When I saw him, I recommended that we perform advanced lipid testing with NMR lipoprotein of since he has had low HDL levels and her recent cholesterol was 185 triglycerides 153 HDL 31 and LDL 123. At that time, I also started him on Ranexa 500 mg twice a day since he had discontinued his isosorbide. I added Zetia 10 mg to his atorvastatin and recommended that he undergo a followup with NMR lipoprotein assessment.  His NMR study was markedly abnormal. Specifically, his total cholesterol was 187 triglycerides 348 HDL 32 LDL cholesterol 85. However, he had markedly increased goal LDL particles at 1739 status LDL particle number was markedly increased at 2463. The patient tells me he did develop some myalgias on Lipitor and therefore for approximately the  last 3-4 weeks completely discontinued a torus that in therapy.  He was  hospitalized on December 26 through 08/21/2013 with a partial small bowel obstruction. He had experienced nausea and vomiting with diarrhea for 4 days prior to his admission and was found to have a partial mid-to high grade small bowel obstruction with a transition point in the right lower quadrant. He was cleared for surgery and if surgery was to be done he was to stop his Plavix and continue aspirin. Fortunately, his bowel obstruction resolved with medical management and surgical intervention was not necessary.  I'd seen him in January 2015 at which time he was off Plavix therapy and was not having any anginal symptoms on his current medical regimen.  He was hospitalized on 03/19/2014 through 03/21/2014 with ataxia.  He had complaints of vertigo with nausea, vomiting, double vision, numbness, and weakness.  A CT of the brain did not show acute intracranial abnormalities, but did show chronic atrophy and small vessel ischemic changes.  A 2-D echo Doppler study showed an EF of 45-50% without cardiac source of emboli.  Carotid studies revealed mild bilateral plaque with narrowing of 1-39% range.  Antegrade flow is vertebral artery.  Doppler of the brachial artery demonstrate triphasic waveforms.  He was felt possibly to have a posterior circulation TIA.  His aspirin 81 mg was increased to 325 mg.  and I last saw him in August 2015 due to his recent TIA.  I recommended reinitiation of Plavix 75 mg daily and reduced his aspirin dose back  to 81 mg.  When I saw several months ago he denied any significant chest pain but had experienced one short episode in February that was nonexertional.  However, he had noticed increasing exertional shortness of breath with activity.  He had stopped using his CPAP therapy and complained of increased fatigue.  He had not seen Dr. Annamaria Boots in some time and I recommended that when he saw him in July that  consideration for follow-up sleep study.  He at home.  At that time, he was planning to undergo cataract surgery sometime this fall.  Due to his exertional shortness of breath and his established coronary artery disease.  I recommended a nuclear perfusion study.  This was done on 02/28/2015 and was intermediate risk.  He developed mild ST segment depression and had occasional PACs and PVCs.  Scintigraphic images revealed a medium defect of moderate severity in the RCA distribution.  He also had an abnormal blood pressure response with hypotension during stress.  I was able to obtain the records from [place] to see if he had any significant inferior defect on prior nuclear studies.  Review his records dating back to 2006 including catheterization studies and subsequent nuclear imaging studies.  On the majority of his nuclear studies dating from 2007.  He was found to have mild fixed basal posterior lateral defect and there was some concern of possible attenuation artifact.  He was not found to have significant ischemia.  A nuclear study in 2011 was interpreted as indeterminate for ischemia with motion artifact.  His last study in [place] was in 2013 which again showed a fixed inferior defect and was felt that he had a low probability of ischemia.   He had seen a neurologist for evaluation of confusion, and staggering gait.  He also had an episode of dizziness yesterday and when he checked his blood pressure.  This was stable at 118/67 with a heart rate at 74.  Since I last saw him, he tells me he has not been using his CPAP for some time.  He dotes some  weakness when he stands abruptly.  He denies any recent chest pain.  He is unaware of palpitations.  He underwent an echo Doppler study on 10/28/2016 which showed an EF 55-60%.  There were no wall motion abnormalities.  There was grade 1 diastolic dysfunction.  PA pressure estimate was 35 mm.  On 10/29/2016, he underwent a nuclear stress test where his EF was  reported at 49%.  No ischemia was noted.  He also underwent carotid duplex imaging.  On 10/29/2016 which showed heterogeneous plaque bilaterally with 1-39%, mild bilateral carotid stenoses, not felt to be significant.  He had normal subclavian arteries, and patent vertebral arteries with antegrade flow.    When I  saw him in March 2018, he had significant orthostatic hypotension and his blood pressure dropped from 114/70 supine to 88/62 standing.  I reduced and ultimately discontinued isosorbide.  Off this therapy, he has not had any recurrent anginal symptomatology.  I started him on Midodrin at 2.5 mg initially twice a day.  I recommended compression stockings to both legs with at least 20-30 mm of support.  With this adjustment, he felt significantly improved.  At his follow-up office visit in May 2018 he was no longer orthostatic and had resolution of prior symptoms.    When I saw him in September 2018 he was doing well and was without recurrent chest pain.  At that time, he was wondering about  trying to wean and possibly get off ranolazine.  He was concerned about the continued high cost.  As result he reduced renal dosing from 1000 mg twice a day to 500 mg twice a day.  However, on the reduced dose he noticed recurrent anginal symptomatology and as result is now back up to 1000 mg twice a day with complete resolution of prior symptoms.  He was not been using his CPAP therapy and was been sleeping in recliner.  When I saw him in April 2019 at which time I stressed the importance of trying to resume CPAP therapy.   His blood pressure was increased and I recommended slight titration of Toprol back to 37.5 mg daily.  He has been on Crestor 20 and Zetia 10 mg for hyperlipidemia.  I suggested he discontinue niacin but continue lovaza.  He has not used CPAP therapy and has no intention at present to reconsider this option.   He is now being evaluated by Dr. Nancy Fetter for his primary care at [facility]  on [address] and no longer sees Dr. Corky Sox at [facility].  He tells me lab work was done last week and he was told that his labs were excellent.  I do not have these results.  Presently he denies any recurrent anginal symptoms and continues to take now generic Ranexa 1000 mg twice daily and Toprol-XL 12.5 mg in the morning and 25 mg in the evening for a total dose of 37.5 mg daily which was adjusted at his last office visit 3 months ago.   He continues to be on aspirin and Plavix for dual antiplatelet therapy.  He is on Zetia and rosuvastatin for hyperlipidemia in addition to omega-3 fatty acids.  He presents for follow-up evaluation.  Past Medical History:  Diagnosis Date  . Angina at rest [facility])   . Anxiety   . Arthritis   . Asthma   . Coronary artery disease    recently started seeing Dr. Claiborne Billings   . Depression   . Diabetes ([place])   . Elevated PSA   . Emphysema   . GERD (gastroesophageal reflux disease)   . Hx of radiation therapy 09/20/13- 11/14/13   prostate 7800 cGy 40 sessions, seminal vesicles 5600 cGy in 40 sessions  . Hypercholesteremia   . Hypertension   . PONV (postoperative nausea and vomiting)   . Prostate cancer ([place]) 05/23/13   gleason 3+4=7, volume 31 cc  . Sleep apnea    c pap with oxygen at 2.5 L    Past Surgical History:  Procedure Laterality Date  . BACK SURGERY  2006   discectomy lumbar  . BACK SURGERY    . BACK SURGERY    . degloving rt leg injury  2000   had skin grafts  . FOOT SURGERY    . heart bypass  2006   [place]  . HERNIA REPAIR  2008   x 3 w/mesh, bilat inguinla, umbilical  . PROSTATE BIOPSY N/A 05/23/2013   Procedure: BIOPSY TRANSRECTAL ULTRASONIC PROSTATE (TUBP);  Surgeon: Molli Hazard, MD;  Location: [facility] ORS;  Service: Urology;  Laterality: N/A;  prostate nerve block  . TONSILLECTOMY     w/adenoidectomy    Allergies  Allergen Reactions  . Niacin And Related Itching    Reaction to Rx strength only, OTC is okay    Current  Outpatient Medications  Medication Sig Dispense Refill  . albuterol (PROVENTIL HFA;VENTOLIN HFA) 108 (90 Base) MCG/ACT inhaler Inhale 2 puffs into the  lungs every 6 (six) hours as needed for wheezing. 3 Inhaler 3  . aspirin EC 81 MG tablet Take 1 tablet (81 mg total) by mouth daily. 90 tablet 3  . budesonide-formoterol (SYMBICORT) 160-4.5 MCG/ACT inhaler Inhale 2 puffs into the lungs 2 (two) times daily.    . clopidogrel (PLAVIX) 75 MG tablet TAKE 1 TABLET BY MOUTH  DAILY 90 tablet 3  . ezetimibe (ZETIA) 10 MG tablet TAKE 1 TABLET BY MOUTH  EVERY MORNING 90 tablet 3  . Ibuprofen-Diphenhydramine Cit ([employer] IBUPROFEN PM) 200-38 MG TABS Take by mouth. As needed    . metFORMIN (GLUCOPHAGE) 500 MG tablet Take 500 mg by mouth 2 (two) times daily.  2  . metoprolol succinate (TOPROL-XL) 25 MG 24 hr tablet Take 12.5 mg in the AM and 25 mg in the PM (total 37.5 mg daily) 135 tablet 3  . midodrine (PROAMATINE) 2.5 MG tablet TAKE 1 TABLET BY MOUTH TWO  TIMES DAILY WITH A MEAL 180 tablet 3  . Multiple Vitamins-Minerals (PRESERVISION AREDS 2 PO) Take 2 capsules by mouth daily.    . nitroGLYCERIN (NITROSTAT) 0.3 MG SL tablet DISSOLVE 1 TABLET UNDER THE TONGUE EVERY 5 MINUTES AS  NEEDED FOR CHEST PAIN (MAX  3 TABS IN 15 MINUTES, CALL  911 IF CHEST PAIN PERSISTS) 100 tablet 0  . omega-3 acid ethyl esters (LOVAZA) 1 g capsule TAKE 1 CAPSULE BY MOUTH  DAILY 90 capsule 3  . RANEXA 1000 MG SR tablet TAKE 1 TABLET BY MOUTH TWO  TIMES DAILY 180 tablet 3  . rosuvastatin (CRESTOR) 20 MG tablet TAKE 1 TABLET BY MOUTH  DAILY 90 tablet 3   No current facility-administered medications for this visit.     Social history is notable in that he is married to his 2 children ages 32 and 62. He smoked one pack of cigarettes per day until 2000. He does drink occasional alcohol. He does walk  Family History  Problem Relation Age of Onset  . Heart disease Mother   . Heart attack Mother   . Cancer Sister   . Depression Sister     ROS General: Negative; No fevers, chills, or night sweats;  HEENT: Negative; No changes in vision or hearing, sinus congestion, difficulty swallowing Pulmonary: Positive for asbestosis exposure No cough, wheezing, shortness of breath, hemoptysis Cardiovascular: Negative; No chest pain, presyncope, syncope, palpitations GI: Negative; No nausea, vomiting, diarrhea, or abdominal pain GU: Positive for prostate CA No dysuria, hematuria, or difficulty voiding Musculoskeletal: Remote degloving injury; no myalgias, joint pain, or weakness Hematologic/Oncology: Negative; no easy bruising, bleeding Endocrine: Negative; no heat/cold intolerance; no diabetes Neuro: See history of present illness, recent ataxia, probable posterior circulation TIA Skin: Negative; No rashes or skin lesions Psychiatric: Negative; No behavioral problems, depression Sleep: Positive for obstructive sleep apnea with supplemental 3 L oxygen; he stopped using CPAP, apparently due to difficulties with his mask and potential mask leak; since he stopped using CPAP he notes more fatigue, daytime sleepiness,; no bruxism, restless legs, hypnogognic hallucinations, no cataplexy Other comprehensive 14 point system review is negative.   PE BP 138/68   Pulse 73   Ht _0  (1.778 m)   Wt 200 lb (90.7 kg)   BMI 28.70 kg/m    Repeat blood pressure by me was 12/68 supine and this dropped to 94/60 standing.  Wt Readings from Last 3 Encounters:  08/28/18 200 lb (90.7 kg)  06/12/18 199 lb 9.6 oz (90.5 kg)  11/23/17 199 lb 9.6 oz (  90.5 kg)   General: Alert, oriented, no distress.  Skin: normal turgor, no rashes, warm and dry HEENT: Normocephalic, atraumatic. Pupils equal round and reactive to light; sclera anicteric; extraocular muscles intact;  Nose without nasal septal hypertrophy Mouth/Parynx benign; Mallinpatti scale 3 Neck: No JVD, no carotid bruits; normal carotid upstroke Lungs: clear to ausculatation and percussion; no  wheezing or rales Chest wall: without tenderness to palpitation Heart: PMI not displaced, RRR, s1 s2 normal, 1/6 systolic murmur, no diastolic murmur, no rubs, gallops, thrills, or heaves Abdomen: Prominent diastases recti.   nontender; no hepatosplenomehaly, BS+; abdominal aorta nontender and not dilated by palpation. Back: no CVA tenderness Pulses 2+ Musculoskeletal: full range of motion, normal strength, no joint deformities Extremities: old surgery of the right lower extremity secondary to his leg getting caught in a conveyor belt in 2000.  He required multiple surgeries.  Mild trace right ankle edema. Neurologic: grossly nonfocal; Cranial nerves grossly wnl Psychologic: Normal mood and affect  ECG (independently read by me): Sinus rhythm with mild sinus arrhythmia at 73 bpm.  No significant ST-T changes.  October 2019 ECG (independently read by me): Normal sinus rhythm with mild sinus arrhythmia.  Normal intervals.  No ST segment changes.  April 2019 ECG (independently read by me): Normal sinus rhythm with mild sinus arrhythmia.  Heart rate 70 bpm.  Normal intervals.  September 2018 ECG (independently read by me): Normal sinus rhythm at 65 bpm with sinus arrhythmia.  QTc interval 416 ms.  PR interval 170 ms.  May 2018 ECG (independently read by me): Sinus rhythm with occasional PVC.  PR interval 182 ms.  QTc interval 435 ms.  March 2018 ECG (independently read by me): Normal sinus rhythm at 68 bpm, mild sinus arrhythmia.  Normal intervals.  No significant ST changes.  February 2018 ECG (independently read by me): Normal sinus rhythm with mild sinus arrhythmia.  Heart rate 69.  PAC.  No significant ST changes.  Normal intervals.  December 2016 ECG (independently read by me): Normal sinus rhythm at 71 bpm with  PACs.  QTc interval normal at 439.  June 2016 ECG (independently read by me): Sinus rhythm with sinus arrhythmia, heart rate averaging 68 bpm.  Intervals normal.  December 2015  ECG (independently read by me): Normal sinus rhythm with mild sinus arrhythmia and PACs with ventricular rate at 76 bpm.  QTc interval 427 ms.  No significant ST segment changes.  August 2015 ECG (independently read by me): Sinus rhythm with sinus arrhythmia.  Nonspecific T changes.  QTc interval 421 ms.  08/30/2013 ECG independently read by me today shows normal sinus rhythm with sinus arrhythmia with a ventricular rate in the 80s. They're nonspecific ST-T changes.  Prior ECG in October 2014: Sinus rhythm with mild sinus arrhythmia at 60 beats per minute. PR interval 172 ms. QTC interval 384 ms.  LABS: BMP Latest Ref Rng & Units 10/15/2016 02/17/2015 03/20/2014  Glucose 65 - 99 mg/dL 147(H) 151(H) 152(H)  BUN 7 - 25 mg/dL _0 Creatinine 0.70 - 1.18 mg/dL 1.00 1.03 0.90  Sodium 135 - 146 mmol/L 137 140 139  Potassium 3.5 - 5.3 mmol/L 4.3 4.4 4.1  Chloride 98 - 110 mmol/L 102 101 99  CO2 20 - 31 mmol/L _1 Calcium 8.6 - 10.3 mg/dL 9.1 9.3 8.9   Hepatic Function Latest Ref Rng & Units 10/15/2016 02/17/2015 03/20/2014  Total Protein 6.1 - 8.1 g/dL 6.2 6.3 6.2  Albumin 3.6 - 5.1 g/dL  4.0 4.1 3.5  AST 10 - 35 U/L _0 ALT 9 - 46 U/L _1 Alk Phosphatase 40 - 115 U/L 29(L) 31(L) 46  Total Bilirubin 0.2 - 1.2 mg/dL 0.7 0.6 0.3   CBC Latest Ref Rng & Units 10/15/2016 02/17/2015 03/20/2014  WBC 3.8 - 10.8 K/uL 6.3 5.3 8.0  Hemoglobin 13.2 - 17.1 g/dL 14.1 14.1 13.2  Hematocrit 38.5 - 50.0 % 41.7 41.2 38.8(L)  Platelets 140 - 400 K/uL 125(L) 137(L) 130(L)   Lab Results  Component Value Date   MCV 91.9 10/15/2016   MCV 89.8 02/17/2015   MCV 88.2 03/20/2014   Lab Results  Component Value Date   TSH 2.98 10/15/2016   Lab Results  Component Value Date   HGBA1C 6.5 (H) 03/20/2014   Lipid Panel     Component Value Date/Time   CHOL 98 10/15/2016 0858   CHOL 118 08/01/2013 0952   TRIG 67 10/15/2016 0858   TRIG 96 08/01/2013 0952   HDL 38 (L) 10/15/2016 0858   HDL 46  08/01/2013 0952   CHOLHDL 2.6 10/15/2016 0858   VLDL 13 10/15/2016 0858   LDLCALC 47 10/15/2016 0858   LDLCALC 53 08/01/2013 0952    Blood work done by Dr. Ardeth Perfect at [facility] from 04/14/2015.  Renal function liver function studies were normal.  Glucose was elevated at 164.  Lipid studies revealed a total cholesterol 121, triglycerides 80, HDL 36, LDL 73.  Hemoglobin A1c was 6.5.  TSH was normal at 2.88.  Apo lipoprotein B was 71.  Recent laboratory was done by Dr. Nancy Fetter at [facility]; results not yet available and we will try to obtain for my review   RADIOLOGY: No results found.  IMPRESSION:   1. Coronary artery disease involving coronary bypass graft of native heart without angina pectoris   2. Coronary artery disease due to lipid rich plaque   3. Hx of CABG   4. Hyperlipidemia with target LDL less than 70   5. Obstructive sleep apnea   6. Diastasis of rectus abdominis     ASSESSMENT AND PLAN: Mr. Jasaiah Karwowski is a 79 year old gentleman who has a history of CAD, hyperlipidemia, diabetes mellitus, COPD, and obstructive sleep apnea. He has a prior tobacco history but he quit on 01/17/1999. He is status post CABG surgery x4 in January 2006 and in 2013 in [place] had abnormal nuclear study.  Repeat catheterization demonstrated some mild blockages.  A prior nuclear study  raised the suspicion for ischemia in the RCA territory.  He had a mild hypotensive response to exercise and had mild ST changes.  He had an abnormal stress test in 2013 which led to his repeat cardiac catheterization.  On prior nuclear studies in [place] he has had consistent inferior defect which oftentimes has been interpreted as diaphragmatic attenuation.  Presently, he has done well with reference anginal symptomatology on his regimen consisting of Ranexa 1000 mg twice a day, metoprolol 37.5 mg which he has been taking in the morning, in addition to aspirin and Plavix.  I last saw him, he was  mildly orthostatic and I adjusted his Toprol dose to 12.5 mg in the morning and 25 mg at night.  He has felt well with this medical adjustment.  Blood pressure today is well controlled.  He denies any orthostatic symptoms.  He denies dizziness.  Presently he is not having any recurrent anginal symptomatology.  He states he continues to sleep well and  denies any issues with reference to his obstructive sleep apnea.  He continues to be on Zetia and rosuvastatin 20 mg with target LDL less than 70 for his CAD.  We will continue with his current dosing of his cardiac regimen.  He is now being seen by his primary physician every 6 months.  I will see him in 1 year for reevaluation or sooner if problems arise.  Time Spent: 25 minutes  Troy Sine, MD, [facility] 08/28/2018 10:30 AM

## 2018-08-28 NOTE — Patient Instructions (Addendum)

## 2018-09-15 ENCOUNTER — Other Ambulatory Visit: Payer: Self-pay | Admitting: Cardiovascular Disease

## 2018-09-28 DIAGNOSIS — H35423 Microcystoid degeneration of retina, bilateral: Secondary | ICD-10-CM | POA: Diagnosis not present

## 2018-09-28 DIAGNOSIS — H3554 Dystrophies primarily involving the retinal pigment epithelium: Secondary | ICD-10-CM | POA: Diagnosis not present

## 2018-09-28 DIAGNOSIS — H43813 Vitreous degeneration, bilateral: Secondary | ICD-10-CM | POA: Diagnosis not present

## 2018-10-23 ENCOUNTER — Other Ambulatory Visit: Payer: Self-pay | Admitting: Cardiovascular Disease

## 2018-10-23 ENCOUNTER — Other Ambulatory Visit: Payer: Self-pay | Admitting: Internal Medicine

## 2018-12-24 ENCOUNTER — Other Ambulatory Visit: Payer: Self-pay | Admitting: Cardiovascular Disease

## 2019-01-09 DIAGNOSIS — M79674 Pain in right toe(s): Secondary | ICD-10-CM | POA: Diagnosis not present

## 2019-01-09 DIAGNOSIS — Z79899 Other long term (current) drug therapy: Secondary | ICD-10-CM | POA: Diagnosis not present

## 2019-01-09 DIAGNOSIS — E7800 Pure hypercholesterolemia, unspecified: Secondary | ICD-10-CM | POA: Diagnosis not present

## 2019-01-09 DIAGNOSIS — E1165 Type 2 diabetes mellitus with hyperglycemia: Secondary | ICD-10-CM | POA: Diagnosis not present

## 2019-01-31 DIAGNOSIS — B351 Tinea unguium: Secondary | ICD-10-CM | POA: Diagnosis not present

## 2019-01-31 DIAGNOSIS — M79674 Pain in right toe(s): Secondary | ICD-10-CM | POA: Diagnosis not present

## 2019-01-31 DIAGNOSIS — E119 Type 2 diabetes mellitus without complications: Secondary | ICD-10-CM | POA: Diagnosis not present

## 2019-01-31 DIAGNOSIS — M79675 Pain in left toe(s): Secondary | ICD-10-CM | POA: Diagnosis not present

## 2019-03-20 DIAGNOSIS — N5201 Erectile dysfunction due to arterial insufficiency: Secondary | ICD-10-CM | POA: Diagnosis not present

## 2019-03-20 DIAGNOSIS — Z8546 Personal history of malignant neoplasm of prostate: Secondary | ICD-10-CM | POA: Diagnosis not present

## 2019-03-22 DIAGNOSIS — H3554 Dystrophies primarily involving the retinal pigment epithelium: Secondary | ICD-10-CM | POA: Diagnosis not present

## 2019-03-22 DIAGNOSIS — E119 Type 2 diabetes mellitus without complications: Secondary | ICD-10-CM | POA: Diagnosis not present

## 2019-03-22 DIAGNOSIS — Z961 Presence of intraocular lens: Secondary | ICD-10-CM | POA: Diagnosis not present

## 2019-04-05 DIAGNOSIS — M79675 Pain in left toe(s): Secondary | ICD-10-CM | POA: Diagnosis not present

## 2019-04-05 DIAGNOSIS — M79674 Pain in right toe(s): Secondary | ICD-10-CM | POA: Diagnosis not present

## 2019-04-05 DIAGNOSIS — B351 Tinea unguium: Secondary | ICD-10-CM | POA: Diagnosis not present

## 2019-04-05 DIAGNOSIS — E119 Type 2 diabetes mellitus without complications: Secondary | ICD-10-CM | POA: Diagnosis not present

## 2019-04-10 ENCOUNTER — Ambulatory Visit (HOSPITAL_COMMUNITY)
Admission: EM | Admit: 2019-04-10 | Discharge: 2019-04-10 | Disposition: A | Discharge status: Home / Self Care | Payer: Medicare Other | Attending: Family Medicine | Admitting: Family Medicine

## 2019-04-10 ENCOUNTER — Encounter (HOSPITAL_COMMUNITY): Payer: Self-pay

## 2019-04-10 ENCOUNTER — Other Ambulatory Visit: Payer: Self-pay

## 2019-04-10 DIAGNOSIS — W06XXXA Fall from bed, initial encounter: Secondary | ICD-10-CM

## 2019-04-10 DIAGNOSIS — W2209XA Striking against other stationary object, initial encounter: Secondary | ICD-10-CM

## 2019-04-10 DIAGNOSIS — S0181XA Laceration without foreign body of other part of head, initial encounter: Secondary | ICD-10-CM

## 2019-04-10 MED ORDER — LIDOCAINE-EPINEPHRINE (PF) 2 %-1:200000 IJ SOLN
INTRAMUSCULAR | Status: AC
  Filled 2019-04-10: qty 20

## 2019-04-10 NOTE — Discharge Instructions (Addendum)
Put ice on the area for swelling and bruising Keep clean and reasonably dry Watch for infection Stitches out in 5-7 days

## 2019-04-10 NOTE — ED Provider Notes (Signed)
[facility]    CSN: [id number] Arrival date & time: 04/10/19  0915      History   Chief Complaint Chief Complaint  Patient presents with  . Laceration    HPI Daniel Singleton is a 78 y.o. male.   HPI  Patient was off balance getting out of bed this morning fell over and hit his forehead on the side table.  He is fine except for a laceration, no headache, no dizziness, no loss of consciousness.  Bleeding is controlled with pressure She has multiple medical problems.  On multiple medications.  Is compliant with his medical care.  Past Medical History:  Diagnosis Date  . Angina at rest [facility])   . Anxiety   . Arthritis   . Asthma   . Coronary artery disease    recently started seeing Dr. Claiborne Billings   . Depression   . Diabetes ([place])   . Elevated PSA   . Emphysema   . GERD (gastroesophageal reflux disease)   . Hx of radiation therapy 09/20/13- 11/14/13   prostate 7800 cGy 40 sessions, seminal vesicles 5600 cGy in 40 sessions  . Hypercholesteremia   . Hypertension   . PONV (postoperative nausea and vomiting)   . Prostate cancer ([place]) 05/23/13   gleason 3+4=7, volume 31 cc  . Sleep apnea    c pap with oxygen at 2.5 L    Patient Active Problem List   Diagnosis Date Noted  . Abnormal nuclear stress test 03/26/2015  . Exertional dyspnea 02/19/2015  . Erectile dysfunction 08/21/2014  . Coronary artery disease involving coronary bypass graft of native heart without angina pectoris 08/06/2014  . HLD (hyperlipidemia) 08/06/2014  . TIA (transient ischemic attack) 04/21/2014  . posterior circulation TIA 03/21/2014  . Ataxia 03/20/2014  . Partial small bowel obstruction ([place]) 08/17/2013  . N&V (nausea and vomiting) 08/15/2013  . Prostate cancer 05/23/2013  . CAD (coronary artery disease) s/p CABG x 4 in 2006 03/31/2013  . Hyperlipidemia with target LDL less than 70 03/31/2013  . Asbestos exposure 03/31/2013  . COPD mixed type ([place]) 12/03/2012  . Obstructive sleep apnea  12/03/2012    Past Surgical History:  Procedure Laterality Date  . BACK SURGERY  2006   discectomy lumbar  . BACK SURGERY    . BACK SURGERY    . degloving rt leg injury  2000   had skin grafts  . FOOT SURGERY    . heart bypass  2006   [facility]  . HERNIA REPAIR  2008   x 3 w/mesh, bilat inguinla, umbilical  . PROSTATE BIOPSY N/A 05/23/2013   Procedure: BIOPSY TRANSRECTAL ULTRASONIC PROSTATE (TUBP);  Surgeon: Molli Hazard, MD;  Location: [facility];  Service: Urology;  Laterality: N/A;  prostate nerve block  . TONSILLECTOMY     w/adenoidectomy       Home Medications    Prior to Admission medications   Medication Sig Start Date End Date Taking? Authorizing Provider  albuterol (PROVENTIL HFA;VENTOLIN HFA) 108 (90 Base) MCG/ACT inhaler Inhale 2 puffs into the lungs every 6 (six) hours as needed for wheezing. 11/07/15   Baird Lyons D, MD  aspirin EC 81 MG tablet Take 1 tablet (81 mg total) by mouth daily. 10/13/16   Troy Sine, MD  budesonide-formoterol [facility]) 160-4.5 MCG/ACT inhaler Inhale 2 puffs into the lungs 2 (two) times daily.    [provider]  clopidogrel (PLAVIX) 75 MG tablet TAKE 1 TABLET BY MOUTH  DAILY 10/23/18  Troy Sine, MD  ezetimibe (ZETIA) 10 MG tablet TAKE 1 TABLET BY MOUTH  EVERY MORNING 10/23/18   Troy Sine, MD  Ibuprofen-Diphenhydramine Cit ([facility] IBUPROFEN PM) 200-38 MG TABS Take by mouth. As needed    [provider]  metFORMIN (GLUCOPHAGE) 500 MG tablet Take 500 mg by mouth 2 (two) times daily. 05/31/18   [provider]  metoprolol succinate (TOPROL-XL) 25 MG 24 hr tablet TAKE 1 AND 1/2 TABLETS BY  MOUTH DAILY 12/25/18   Troy Sine, MD  midodrine (PROAMATINE) 2.5 MG tablet TAKE 1 TABLET BY MOUTH TWO  TIMES DAILY WITH A MEAL 10/23/18   Troy Sine, MD  Multiple Vitamins-Minerals (PRESERVISION AREDS 2 PO) Take 2 capsules by mouth daily.    [provider]  nitroGLYCERIN (NITROSTAT) 0.3 MG SL tablet  DISSOLVE 1 TABLET UNDER THE TONGUE EVERY 5 MINUTES AS  NEEDED FOR CHEST PAIN (MAX  3 TABS IN 15 MINUTES, CALL  911 IF CHEST PAIN PERSISTS) 07/31/18   Troy Sine, MD  omega-3 acid ethyl esters (LOVAZA) 1 g capsule Take 1 capsule (1 g total) by mouth daily. 09/15/18   Troy Sine, MD  ranolazine (RANEXA) 1000 MG SR tablet TAKE 1 TABLET BY MOUTH TWO  TIMES DAILY 10/23/18   Troy Sine, MD  rosuvastatin (CRESTOR) 20 MG tablet Take 1 tablet (20 mg total) by mouth daily. 09/15/18   Troy Sine, MD    Family History Family History  Problem Relation Age of Onset  . Heart disease Mother   . Heart attack Mother   . Cancer Sister   . Depression Sister     Social History Social History   Tobacco Use  . Smoking status: Former Smoker    Packs/day: 1.00    Years: 40.00    Pack years: 40.00    Types: Cigarettes    Quit date: 01/17/1999    Years since quitting: 20.2  . Smokeless tobacco: Never Used  Substance Use Topics  . Alcohol use: No    Alcohol/week: 0.0 standard drinks  . Drug use: No     Allergies   Niacin and related   Review of Systems Review of Systems  Constitutional: Negative for chills and fever.  HENT: Negative for ear pain and sore throat.   Eyes: Negative for pain and visual disturbance.  Respiratory: Negative for cough and shortness of breath.   Cardiovascular: Negative for chest pain and palpitations.  Gastrointestinal: Negative for abdominal pain and vomiting.  Genitourinary: Negative for dysuria and hematuria.  Musculoskeletal: Negative for arthralgias and back pain.  Skin: Positive for wound. Negative for color change and rash.  Neurological: Negative for seizures and syncope.  All other systems reviewed and are negative.    Physical Exam Triage Vital Signs ED Triage Vitals  Enc Vitals Group     BP 04/10/19 0952 133/66     Pulse Rate 04/10/19 0952 71     Resp 04/10/19 0952 18     Temp 04/10/19 0952 97.9 F (36.6 C)     Temp Source 04/10/19  0952 Oral     SpO2 04/10/19 0952 97 %     Weight 04/10/19 0949 195 lb (88.5 kg)     Height --      Head Circumference --      Peak Flow --      Pain Score 04/10/19 0949 7     Pain Loc --      Pain Edu? --  Excl. in GC? --    No data found.  Updated Vital Signs BP 133/66 (BP Location: Right Arm)   Pulse 71   Temp 97.9 F (36.6 C) (Oral)   Resp 18   Wt 88.5 kg   SpO2 97%   BMI 27.98 kg/m   Visual Acuity Right Eye Distance:   Left Eye Distance:   Bilateral Distance:    Right Eye Near:   Left Eye Near:    Bilateral Near:     Physical Exam Constitutional:      General: He is not in acute distress.    Appearance: He is well-developed.  HENT:     Head: Normocephalic and atraumatic.   Eyes:     Conjunctiva/sclera: Conjunctivae normal.     Pupils: Pupils are equal, round, and reactive to light.  Neck:     Musculoskeletal: Normal range of motion.  Cardiovascular:     Rate and Rhythm: Normal rate.  Pulmonary:     Effort: Pulmonary effort is normal. No respiratory distress.  Abdominal:     General: There is no distension.     Palpations: Abdomen is soft.  Musculoskeletal: Normal range of motion.  Skin:    General: Skin is warm and dry.  Neurological:     Mental Status: He is alert.      UC Treatments / Results  Labs (all labs ordered are listed, but only abnormal results are displayed) Labs Reviewed - No data to display  EKG   Radiology No results found.  Procedures Laceration Repair  Date/Time: 04/10/2019 8:13 PM Performed by: Raylene Everts, MD Authorized by: Raylene Everts, MD   Consent:    Consent obtained:  Verbal   Consent given by:  Patient   Risks discussed:  Poor cosmetic result   Alternatives discussed:  No treatment Anesthesia (see MAR for exact dosages):    Anesthesia method:  Local infiltration   Local anesthetic:  Lidocaine 1% WITH epi Laceration details:    Location:  Face   Face location:  R eyebrow   Length  (cm):  2.5   Depth (mm):  6 Repair type:    Repair type:  Simple Pre-procedure details:    Preparation:  Patient was prepped and draped in usual sterile fashion Exploration:    Hemostasis achieved with:  Direct pressure   Wound exploration: entire depth of wound probed and visualized   Treatment:    Area cleansed with:  Betadine   Amount of cleaning:  Standard Skin repair:    Repair method:  Sutures   Suture size:  5-0   Suture material:  Prolene   Suture technique:  Simple interrupted   Number of sutures:  6 Approximation:    Approximation:  Close Post-procedure details:    Dressing:  Antibiotic ointment   Patient tolerance of procedure:  Tolerated well, no immediate complications   (including critical care time)  Medications Ordered in UC Medications  lidocaine-EPINEPHrine (XYLOCAINE W/EPI) 2 %-1:200000 (PF) injection (has no administration in time range)    Initial Impression / Assessment and Plan / UC Course  I have reviewed the triage vital signs and the nursing notes.  Pertinent labs & imaging results that were available during my care of the patient were reviewed by me and considered in my medical decision making (see chart for details).     WOUND CARE DISCUSSED Final Clinical Impressions(s) / UC Diagnoses   Final diagnoses:  Laceration of forehead, initial encounter     Discharge  Instructions     Put ice on the area for swelling and bruising Keep clean and reasonably dry Watch for infection Stitches out in 5-7 days   ED Prescriptions    None     Controlled Substance Prescriptions [place] Controlled Substance Registry consulted? Not Applicable   Raylene Everts, MD 04/10/19 2015

## 2019-04-10 NOTE — ED Triage Notes (Signed)
Pt states he rolled right out of bed this morning and struck his head on the night stand next to the bed. This happen at 5 :30 am. Pt states he take blood thinners and he couldn't get it to stop bleeding. ( forehead over his right eye )

## 2019-05-22 DIAGNOSIS — I10 Essential (primary) hypertension: Secondary | ICD-10-CM | POA: Diagnosis not present

## 2019-05-22 DIAGNOSIS — E7800 Pure hypercholesterolemia, unspecified: Secondary | ICD-10-CM | POA: Diagnosis not present

## 2019-05-22 DIAGNOSIS — I2510 Atherosclerotic heart disease of native coronary artery without angina pectoris: Secondary | ICD-10-CM | POA: Diagnosis not present

## 2019-05-22 DIAGNOSIS — Z7984 Long term (current) use of oral hypoglycemic drugs: Secondary | ICD-10-CM | POA: Diagnosis not present

## 2019-05-22 DIAGNOSIS — Z8673 Personal history of transient ischemic attack (TIA), and cerebral infarction without residual deficits: Secondary | ICD-10-CM | POA: Diagnosis not present

## 2019-05-22 DIAGNOSIS — E1159 Type 2 diabetes mellitus with other circulatory complications: Secondary | ICD-10-CM | POA: Diagnosis not present

## 2019-05-22 DIAGNOSIS — J449 Chronic obstructive pulmonary disease, unspecified: Secondary | ICD-10-CM | POA: Diagnosis not present

## 2019-05-22 DIAGNOSIS — Z23 Encounter for immunization: Secondary | ICD-10-CM | POA: Diagnosis not present

## 2019-05-28 ENCOUNTER — Telehealth: Payer: Self-pay | Admitting: Cardiovascular Disease

## 2019-05-28 NOTE — Telephone Encounter (Signed)
New Message   Pt c/o medication issue:  1. Name of Medication: Midodrine 2.5mg   2. How are you currently taking this medication (dosage and times per day)? 1 tablet by mouth daily   3. Are you having a reaction (difficulty breathing--STAT)?  no 4. What is your medication issue?  Sherrin Daisy from [facility] calling on behalf of Dr. Lindell Noe, and would like to know if patient should still be taking the medication since he's taking Metoprolol as well.

## 2019-05-28 NOTE — Telephone Encounter (Signed)
Left message for Daniel Singleton, will forward to dr Claiborne Billings to review and advise.

## 2019-05-31 DIAGNOSIS — B351 Tinea unguium: Secondary | ICD-10-CM | POA: Diagnosis not present

## 2019-05-31 DIAGNOSIS — M79675 Pain in left toe(s): Secondary | ICD-10-CM | POA: Diagnosis not present

## 2019-05-31 DIAGNOSIS — M79674 Pain in right toe(s): Secondary | ICD-10-CM | POA: Diagnosis not present

## 2019-05-31 DIAGNOSIS — E119 Type 2 diabetes mellitus without complications: Secondary | ICD-10-CM | POA: Diagnosis not present

## 2019-06-05 ENCOUNTER — Telehealth: Payer: Self-pay | Admitting: Cardiovascular Disease

## 2019-06-05 NOTE — Telephone Encounter (Signed)
Called back- went to voicemail. Left message to call back, left call back number.

## 2019-06-05 NOTE — Telephone Encounter (Signed)
[place] back- she is asking if patient should be on both Midodrine and Metoprolol, patient was new to their practice and unsure if they were to be taking both. Last office visit 08/2018, advised that I would route to MD to advise.

## 2019-06-05 NOTE — Telephone Encounter (Signed)
° °  Please return call to Glory Buff at [facility] to verify medications. Please call [phone]

## 2019-06-05 NOTE — Telephone Encounter (Signed)
New message   Please call Glory Buff at [facility] in reference to this patient's medication.

## 2019-06-06 NOTE — Telephone Encounter (Signed)
Waiting on response from MD per Julie's note 10/13

## 2019-06-06 NOTE — Telephone Encounter (Signed)
Left message to call back  

## 2019-06-06 NOTE — Telephone Encounter (Signed)
Follow up:     Daniel Singleton from [facility] would like for some one to call her direct line [phone]. Please call back today.

## 2019-06-07 NOTE — Telephone Encounter (Signed)
He has a history of orthostatic hypotension.  When last seen his beta-blocker was adjusted

## 2019-06-08 NOTE — Telephone Encounter (Signed)
Please advise- will call today and notify the only medicine mentioned is the Metoprolol per last office visit.  -Presently, he has done well with reference anginal symptomatology on his regimen consisting of Ranexa 1000 mg twice a day, metoprolol 37.5 mg which he has been taking in the morning, in addition to aspirin and Plavix.  I last saw him, he was mildly orthostatic and I adjusted his Toprol dose to 12.5 mg in the morning and 25 mg at night.  He has felt well with this medical adjustment.

## 2019-06-08 NOTE — Telephone Encounter (Signed)
Correction- called nurse Glory Buff.

## 2019-06-08 NOTE — Telephone Encounter (Signed)
Called patient, advised of message from [facility]. LVM and advised if any other questions to call back. Left call back number.

## 2019-06-22 ENCOUNTER — Other Ambulatory Visit: Payer: Self-pay | Admitting: Cardiovascular Disease

## 2019-08-03 ENCOUNTER — Other Ambulatory Visit: Payer: Self-pay | Admitting: Cardiovascular Disease

## 2019-08-28 ENCOUNTER — Encounter: Payer: Self-pay | Admitting: Cardiovascular Disease

## 2019-08-28 ENCOUNTER — Ambulatory Visit (INDEPENDENT_AMBULATORY_CARE_PROVIDER_SITE_OTHER): Payer: Medicare Other | Admitting: Cardiovascular Disease

## 2019-08-28 ENCOUNTER — Other Ambulatory Visit: Payer: Self-pay

## 2019-08-28 VITALS — BP 144/81 | HR 65 | Temp 97.1°F | Ht 70.0 in | Wt 201.0 lb

## 2019-08-28 DIAGNOSIS — I25119 Atherosclerotic heart disease of native coronary artery with unspecified angina pectoris: Secondary | ICD-10-CM | POA: Diagnosis not present

## 2019-08-28 DIAGNOSIS — Z951 Presence of aortocoronary bypass graft: Secondary | ICD-10-CM

## 2019-08-28 DIAGNOSIS — I2510 Atherosclerotic heart disease of native coronary artery without angina pectoris: Secondary | ICD-10-CM | POA: Diagnosis not present

## 2019-08-28 DIAGNOSIS — G459 Transient cerebral ischemic attack, unspecified: Secondary | ICD-10-CM

## 2019-08-28 DIAGNOSIS — E118 Type 2 diabetes mellitus with unspecified complications: Secondary | ICD-10-CM

## 2019-08-28 DIAGNOSIS — E785 Hyperlipidemia, unspecified: Secondary | ICD-10-CM

## 2019-08-28 DIAGNOSIS — R0602 Shortness of breath: Secondary | ICD-10-CM | POA: Diagnosis not present

## 2019-08-28 DIAGNOSIS — G450 Vertebro-basilar artery syndrome: Secondary | ICD-10-CM | POA: Diagnosis not present

## 2019-08-28 DIAGNOSIS — G4733 Obstructive sleep apnea (adult) (pediatric): Secondary | ICD-10-CM

## 2019-08-28 DIAGNOSIS — I25810 Atherosclerosis of coronary artery bypass graft(s) without angina pectoris: Secondary | ICD-10-CM

## 2019-08-28 NOTE — Progress Notes (Signed)
Singleton ID: [name], male   DOB: [birth year], 79 y.o.   MRN: [id number]    Primary M.D.: Dr. Laverna Peace at [employer]  HPI:  Daniel Singleton is a 79 year old gentleman who presents for a12 month follow-up cardiology evaluation.  Daniel Singleton has a history of hypertension, hyperlipidemia, COPD, prior asbestos exposure, diabetes mellitus , and  prostate cancer. In January 2006, he underwent CABG surgery x4  in [facility] by Dr. Zorita Pang. I do not have Daniel specifics of his bypass grafts. He had stress test in [place] in 2013 after he had experienced some recurrent chest pain episodes and a cardiac catheterization which showed mild blockages.  He also has a history of obstructive sleep apnea on CPAP therapy with 3 L of oxygen and is followed by Dr. Keturah Barre.  Daniel Singleton admits to mild shortness of breath.  He has a history of hyperlipidemia.   In Daniel past he had been on isosorbide mononitrate therapy but stopped taking this because of libido concerns and potential need for medications for erectile function.  An echo Doppler study on 04/03/2013 showed an ejection fraction of 55-60%; mild tricuspid regurgitation, and grade 1 diastolic dysfunction. When I saw him, I recommended that we perform advanced lipid testing with NMR lipoprotein of since he has had low HDL levels and her recent cholesterol was 185 triglycerides 153 HDL 31 and LDL 123. At that time, I also started him on Ranexa 500 mg twice a day since he had discontinued his isosorbide. I added Zetia 10 mg to his atorvastatin and recommended that he undergo a followup with NMR lipoprotein assessment.  His NMR study was markedly abnormal. Specifically, his total cholesterol was 187 triglycerides 348 HDL 32 LDL cholesterol 85. However, he had markedly increased goal LDL particles at 1739 status LDL particle number was markedly increased at 2463. Daniel Singleton tells me he did develop some myalgias on Lipitor and therefore for  approximately Daniel last 3-4 weeks completely discontinued a torus that in therapy.  He was  hospitalized on December 26 through 08/21/2013 with a partial small bowel obstruction. He had experienced nausea and vomiting with diarrhea for 4 days prior to his admission and was found to have a partial mid-to high grade small bowel obstruction with a transition point in Daniel right lower quadrant. He was cleared for surgery and if surgery was to be done he was to stop his Plavix and continue aspirin. Fortunately, his bowel obstruction resolved with medical management and surgical intervention was not necessary.  I'd seen him in January 2015 at which time he was off Plavix therapy and was not having any anginal symptoms on his current medical regimen.  He was hospitalized on 03/19/2014 through 03/21/2014 with ataxia.  He had complaints of vertigo with nausea, vomiting, double vision, numbness, and weakness.  A CT of Daniel brain did not show acute intracranial abnormalities, but did show chronic atrophy and small vessel ischemic changes.  A 2-D echo Doppler study showed an EF of 45-50% without cardiac source of emboli.  Carotid studies revealed mild bilateral plaque with narrowing of 1-39% range.  Antegrade flow is vertebral artery.  Doppler of Daniel brachial artery demonstrate triphasic waveforms.  He was felt possibly to have a posterior circulation TIA.  His aspirin 81 mg was increased to 325 mg.  and I last saw him in August 2015 due to his recent TIA.  I recommended reinitiation of Plavix 75 mg daily and reduced his aspirin dose  back to 81 mg.  When I saw several months ago he denied any significant chest pain but had experienced one short episode in February that was nonexertional.  However, he had noticed increasing exertional shortness of breath with activity.  He had stopped using his CPAP therapy and complained of increased fatigue.  He had not seen Dr. Annamaria Boots in some time and I recommended that when he saw him in  July that consideration for follow-up sleep study.  He at home.  At that time, he was planning to undergo cataract surgery sometime this fall.  Due to his exertional shortness of breath and his established coronary artery disease.  I recommended a nuclear perfusion study.  This was done on 02/28/2015 and was intermediate risk.  He developed mild ST segment depression and had occasional PACs and PVCs.  Scintigraphic images revealed a medium defect of moderate severity in Daniel RCA distribution.  He also had an abnormal blood pressure response with hypotension during stress.  I was able to obtain Daniel records from [place] to see if he had any significant inferior defect on prior nuclear studies.  Review his records dating back to 2006 including catheterization studies and subsequent nuclear imaging studies.  On Daniel majority of his nuclear studies dating from 2007.  He was found to have mild fixed basal posterior lateral defect and there was some concern of possible attenuation artifact.  He was not found to have significant ischemia.  A nuclear study in 2011 was interpreted as indeterminate for ischemia with motion artifact.  His last study in [place] was in 2013 which again showed a fixed inferior defect and was felt that he had a low probability of ischemia.   He had seen a neurologist for evaluation of confusion, and staggering gait.  He also had an episode of dizziness yesterday and when he checked his blood pressure.  This was stable at 118/67 with a heart rate at 74.  Since I last saw him, he tells me he has not been using his CPAP for some time.  He dotes some  weakness when he stands abruptly.  He denies any recent chest pain.  He is unaware of palpitations.  He underwent an echo Doppler study on 10/28/2016 which showed an EF 55-60%.  There were no wall motion abnormalities.  There was grade 1 diastolic dysfunction.  PA pressure estimate was 35 mm.  On 10/29/2016, he underwent a nuclear stress test where  his EF was reported at 49%.  No ischemia was noted.  He also underwent carotid duplex imaging.  On 10/29/2016 which showed heterogeneous plaque bilaterally with 1-39%, mild bilateral carotid stenoses, not felt to be significant.  He had normal subclavian arteries, and patent vertebral arteries with antegrade flow.    When I  saw him in March 2018, he had significant orthostatic hypotension and his blood pressure dropped from 114/70 supine to 88/62 standing.  I reduced and ultimately discontinued isosorbide.  Off this therapy, he has not had any recurrent anginal symptomatology.  I started him on Midodrin at 2.5 mg initially twice a day.  I recommended compression stockings to both legs with at least 20-30 mm of support.  With this adjustment, he felt significantly improved.  At his follow-up office visit in May 2018 he was no longer orthostatic and had resolution of prior symptoms.    When I saw him in September 2018 he was doing well and was without recurrent chest pain.  At that time, he was wondering  about trying to wean and possibly get off ranolazine.  He was concerned about Daniel continued high cost.  As result he reduced renal dosing from 1000 mg twice a day to 500 mg twice a day.  However, on Daniel reduced dose he noticed recurrent anginal symptomatology and as result is now back up to 1000 mg twice a day with complete resolution of prior symptoms.  He was not been using his CPAP therapy and was been sleeping in recliner.  When I saw him in April 2019 at which time I stressed Daniel importance of trying to resume CPAP therapy.   His blood pressure was increased and I recommended slight titration of Toprol back to 37.5 mg daily.  He has been on Crestor 20 and Zetia 10 mg for hyperlipidemia.  I suggested he discontinue niacin but continue lovaza.  He has not used CPAP therapy and has no intention at present to reconsider this option.   When I last saw him in January 2020 he was seeing Dr. Nancy Fetter for his primary  care at [facility] on [address] and was no longer sees Dr. Corky Sox at [facility].  Reportedly labs were excellent .   He denied any recurrent anginal symptoms and continues to take now generic Ranexa 1000 mg twice daily and Toprol-XL 12.5 mg in Daniel morning and 25 mg in Daniel evening for a total dose of 37.5 mg daily which was adjusted at his last office visit 3 months ago.   He continues to be on aspirin and Plavix for dual antiplatelet therapy.  He is on Zetia and rosuvastatin for hyperlipidemia in addition to omega-3 fatty acids.    Since I last saw him, he has developed dry macular degeneration and is now essentially blind in his left eye and has some involvement in his right eye.  He is no longer seeing Dr. Nancy Fetter for his primary care and has seen Dr. Lindell Noe at [place].  He did not admits to exertional shortness of breath with has progressed from his last evaluation.  He denies any episodes of chest tightness.  He is now on metoprolol 37.5 mg daily, ranolazine 1000 mg twice a day, aspirin/Plavix for his CAD.  He is on Zetia and rosuvastatin for hyperlipidemia.  He presents for evaluation.  Past Medical History:  Diagnosis Date  . Angina at [facility])   . Anxiety   . Arthritis   . Asthma   . Coronary artery disease    recently started seeing Dr. Claiborne Billings   . Depression   . Diabetes ([place])   . Elevated PSA   . Emphysema   . GERD (gastroesophageal reflux disease)   . Hx of radiation therapy 09/20/13- 11/14/13   prostate 7800 cGy 40 sessions, seminal vesicles 5600 cGy in 40 sessions  . Hypercholesteremia   . Hypertension   . PONV (postoperative nausea and vomiting)   . Prostate cancer ([place]) 05/23/13   gleason 3+4=7, volume 31 cc  . Sleep apnea    c pap with oxygen at 2.5 L    Past Surgical History:  Procedure Laterality Date  . BACK SURGERY  2006   discectomy lumbar  . BACK SURGERY    . BACK SURGERY    . degloving rt leg injury  2000   had skin grafts  . FOOT SURGERY     . heart bypass  2006   [place]  . HERNIA REPAIR  2008   x 3 w/mesh, bilat inguinla, umbilical  . PROSTATE BIOPSY  N/A 05/23/2013   Procedure: BIOPSY TRANSRECTAL ULTRASONIC PROSTATE (TUBP);  Surgeon: Molli Hazard, MD;  Location: [facility];  Service: Urology;  Laterality: N/A;  prostate nerve block  . TONSILLECTOMY     w/adenoidectomy    Allergies  Allergen Reactions  . Niacin And Related Itching    Reaction to Rx strength only, OTC is okay    Current Outpatient Medications  Medication Sig Dispense Refill  . albuterol (PROVENTIL HFA;VENTOLIN HFA) 108 (90 Base) MCG/ACT inhaler Inhale 2 puffs into Daniel lungs every 6 (six) hours as needed for wheezing. 3 Inhaler 3  . aspirin EC 81 MG tablet Take 1 tablet (81 mg total) by mouth daily. 90 tablet 3  . budesonide-formoterol (SYMBICORT) 160-4.5 MCG/ACT inhaler Inhale 2 puffs into Daniel lungs 2 (two) times daily.    . clopidogrel (PLAVIX) 75 MG tablet TAKE 1 TABLET BY MOUTH  DAILY 90 tablet 3  . ezetimibe (ZETIA) 10 MG tablet TAKE 1 TABLET BY MOUTH  EVERY MORNING 90 tablet 3  . Ibuprofen-Diphenhydramine Cit ([employer] IBUPROFEN PM) 200-38 MG TABS Take by mouth. As needed    . metFORMIN (GLUCOPHAGE) 500 MG tablet Take 500 mg by mouth 2 (two) times daily.  2  . metoprolol succinate (TOPROL-XL) 25 MG 24 hr tablet TAKE 1 AND 1/2 TABLETS BY  MOUTH DAILY 135 tablet 3  . midodrine (PROAMATINE) 2.5 MG tablet TAKE 1 TABLET BY MOUTH TWO  TIMES DAILY WITH A MEAL 180 tablet 3  . Multiple Vitamins-Minerals (PRESERVISION AREDS 2 PO) Take 2 capsules by mouth daily.    . nitroGLYCERIN (NITROSTAT) 0.3 MG SL tablet DISSOLVE 1 TABLET UNDER Daniel TONGUE EVERY 5 MINUTES AS  NEEDED FOR CHEST PAIN (MAX  3 TABS IN 15 MINUTES, CALL  911 IF CHEST PAIN PERSISTS) 100 tablet 3  . omega-3 acid ethyl esters (LOVAZA) 1 g capsule Take 1 capsule (1 g total) by mouth daily. 90 capsule 3  . ranolazine (RANEXA) 1000 MG SR tablet TAKE 1 TABLET BY MOUTH TWO  TIMES DAILY 180 tablet 3  .  rosuvastatin (CRESTOR) 20 MG tablet TAKE 1 TABLET BY MOUTH  DAILY 90 tablet 3   No current facility-administered medications for this visit.    Social history is notable in that he is married to his 2 children. He smoked one pack of cigarettes per day until 2000. He does drink occasional alcohol. He does walk  Family History  Problem Relation Age of Onset  . Heart disease Mother   . Heart attack Mother   . Cancer Sister   . Depression Sister    ROS General: Negative; No fevers, chills, or night sweats;  HEENT: Positive for macular degeneration, sinus congestion, difficulty swallowing Pulmonary: Positive for asbestosis exposure No cough, wheezing, shortness of breath, hemoptysis Cardiovascular: Negative; No chest pain, presyncope, syncope, palpitations GI: Negative; No nausea, vomiting, diarrhea, or abdominal pain GU: Positive for prostate CA No dysuria, hematuria, or difficulty voiding Musculoskeletal: Remote degloving injury; no myalgias, joint pain, or weakness Hematologic/Oncology: Negative; no easy bruising, bleeding Endocrine: Negative; no heat/cold intolerance; no diabetes Neuro: See history of present illness, recent ataxia, probable posterior circulation TIA Skin: Negative; No rashes or skin lesions Psychiatric: Negative; No behavioral problems, depression Sleep: Positive for obstructive sleep apnea with supplemental 3 L oxygen; he stopped using CPAP, apparently due to difficulties with his mask and potential mask leak; since he stopped using CPAP he notes more fatigue, daytime sleepiness,; no bruxism, restless legs, hypnogognic hallucinations, no cataplexy Other comprehensive 14  point system review is negative.   PE BP (!) 144/81   Pulse 65   Temp (!) 97.1 F (36.2 C)   Ht 5' 10"  (1.778 m)   Wt 201 lb (91.2 kg)   SpO2 98%   BMI 28.84 kg/m    Repeat blood pressure was 124/74 supine 112/68 standing.  He no longer is taking midodrine.  Wt Readings from Last 3  Encounters:  08/28/19 201 lb (91.2 kg)  04/10/19 195 lb (88.5 kg)  08/28/18 200 lb (90.7 kg)   General: Alert, oriented, no distress.  Skin: normal turgor, no rashes, warm and dry HEENT: Normocephalic, atraumatic. Pupils equal round and reactive to light; sclera anicteric; extraocular muscles intact;  Nose without nasal septal hypertrophy Mouth/Parynx benign; Mallinpatti scale 3 Neck: No JVD, no carotid bruits; normal carotid upstroke Lungs: clear to ausculatation and percussion; no wheezing or rales Chest wall: without tenderness to palpitation Heart: PMI not displaced, RRR, s1 s2 normal, 1/6 systolic murmur, no diastolic murmur, no rubs, gallops, thrills, or heaves Abdomen: soft, nontender; no hepatosplenomehaly, BS+; abdominal aorta nontender and not dilated by palpation. Back: no CVA tenderness Pulses 2+ Musculoskeletal: full range of motion, normal strength, no joint deformities Extremities: no clubbing cyanosis or edema, Homan's sign negative  Neurologic: grossly nonfocal; Cranial nerves grossly wnl Psychologic: Normal mood and affect   ECG (independently read by me): Normal sinus rhythm with mild sinus arrhythmia.  Normal intervals.  No significant ST changes  January 2020 ECG (independently read by me): Sinus rhythm with mild sinus arrhythmia at 73 bpm.  No significant ST-T changes.  October 2019 ECG (independently read by me): Normal sinus rhythm with mild sinus arrhythmia.  Normal intervals.  No ST segment changes.  April 2019 ECG (independently read by me): Normal sinus rhythm with mild sinus arrhythmia.  Heart rate 70 bpm.  Normal intervals.  September 2018 ECG (independently read by me): Normal sinus rhythm at 65 bpm with sinus arrhythmia.  QTc interval 416 ms.  PR interval 170 ms.  May 2018 ECG (independently read by me): Sinus rhythm with occasional PVC.  PR interval 182 ms.  QTc interval 435 ms.  March 2018 ECG (independently read by me): Normal sinus rhythm at 68  bpm, mild sinus arrhythmia.  Normal intervals.  No significant ST changes.  February 2018 ECG (independently read by me): Normal sinus rhythm with mild sinus arrhythmia.  Heart rate 69.  PAC.  No significant ST changes.  Normal intervals.  December 2016 ECG (independently read by me): Normal sinus rhythm at 71 bpm with  PACs.  QTc interval normal at 439.  June 2016 ECG (independently read by me): Sinus rhythm with sinus arrhythmia, heart rate averaging 68 bpm.  Intervals normal.  December 2015 ECG (independently read by me): Normal sinus rhythm with mild sinus arrhythmia and PACs with ventricular rate at 76 bpm.  QTc interval 427 ms.  No significant ST segment changes.  August 2015 ECG (independently read by me): Sinus rhythm with sinus arrhythmia.  Nonspecific T changes.  QTc interval 421 ms.  08/30/2013 ECG independently read by me today shows normal sinus rhythm with sinus arrhythmia with a ventricular rate in Daniel 80s. They're nonspecific ST-T changes.  Prior ECG in October 2014: Sinus rhythm with mild sinus arrhythmia at 60 beats per minute. PR interval 172 ms. QTC interval 384 ms.  LABS: BMP Latest Ref Rng & Units 10/15/2016 02/17/2015 03/20/2014  Glucose 65 - 99 mg/dL 147(H) 151(H) 152(H)  BUN 7 - 25  mg/dL 14 16 11   Creatinine 0.70 - 1.18 mg/dL 1.00 1.03 0.90  Sodium 135 - 146 mmol/L 137 140 139  Potassium 3.5 - 5.3 mmol/L 4.3 4.4 4.1  Chloride 98 - 110 mmol/L 102 101 99  CO2 20 - 31 mmol/L 26 28 24   Calcium 8.6 - 10.3 mg/dL 9.1 9.3 8.9   Hepatic Function Latest Ref Rng & Units 10/15/2016 02/17/2015 03/20/2014  Total Protein 6.1 - 8.1 g/dL 6.2 6.3 6.2  Albumin 3.6 - 5.1 g/dL 4.0 4.1 3.5  AST 10 - 35 U/L 19 18 19   ALT 9 - 46 U/L 19 16 18   Alk Phosphatase 40 - 115 U/L 29(L) 31(L) 46  Total Bilirubin 0.2 - 1.2 mg/dL 0.7 0.6 0.3   CBC Latest Ref Rng & Units 10/15/2016 02/17/2015 03/20/2014  WBC 3.8 - 10.8 K/uL 6.3 5.3 8.0  Hemoglobin 13.2 - 17.1 g/dL 14.1 14.1 13.2  Hematocrit 38.5 -  50.0 % 41.7 41.2 38.8(L)  Platelets 140 - 400 K/uL 125(L) 137(L) 130(L)   Lab Results  Component Value Date   MCV 91.9 10/15/2016   MCV 89.8 02/17/2015   MCV 88.2 03/20/2014   Lab Results  Component Value Date   TSH 2.98 10/15/2016   Lab Results  Component Value Date   HGBA1C 6.5 (H) 03/20/2014   Lipid Panel     Component Value Date/Time   CHOL 98 10/15/2016 0858   CHOL 118 08/01/2013 0952   TRIG 67 10/15/2016 0858   TRIG 96 08/01/2013 0952   HDL 38 (L) 10/15/2016 0858   HDL 46 08/01/2013 0952   CHOLHDL 2.6 10/15/2016 0858   VLDL 13 10/15/2016 0858   LDLCALC 47 10/15/2016 0858   LDLCALC 53 08/01/2013 0952    Blood work done by [facility] Ardeth Perfect at [facility] from 04/14/2015.  Renal function liver function studies were normal.  Glucose was elevated at 164.  Lipid studies revealed a total cholesterol 121, triglycerides 80, HDL 36, LDL 73.  Hemoglobin A1c was 6.5.  TSH was normal at 2.88.  Apo lipoprotein B was 71.  Recent laboratory from September 2020 showed an LDL cholesterol at 40.  Total cholesterol 92, triglycerides 89 and HDL 35.  Hemoglobin A1c 6.6.  BUN 12, creatinine 1.02.   RADIOLOGY: No results found.  IMPRESSION:   1. Shortness of breath   2. Obstructive sleep apnea   3. Hx of CABG   4. Coronary artery disease involving native coronary artery of native heart with angina pectoris ([place])   5. Hyperlipidemia with target LDL less than 70   6. Type 2 diabetes mellitus with complication, without long-term current use of insulin (HCC)     ASSESSMENT AND PLAN: Daniel Singleton is a 79 year old gentleman who has a history of CAD, hyperlipidemia, diabetes mellitus, COPD, and obstructive sleep apnea. He has a prior tobacco history but he quit on 01/17/1999. He is status post CABG surgery x4 in January 2006 and in 2013 in [place] had abnormal nuclear study.  Repeat catheterization demonstrated some mild blockages.  A prior nuclear study  raised Daniel suspicion for  ischemia in Daniel RCA territory.  He had a mild hypotensive response to exercise and had mild ST changes.  He had an abnormal stress test in 2013 which led to his repeat cardiac catheterization.  On prior nuclear studies in [place] he has had consistent inferior defect which oftentimes has been interpreted as diaphragmatic attenuation.  Recently, he has begun to notice increasing exertional shortness of breath.  He no longer has issues with orthostatic hypotension and is no longer on midodrine.  His blood pressure today is stable on metoprolol succinate 37.5 mg. He is maintaining sinus rhythm with mild sinus arrhythmia.  He continues to tolerate Ranexa 1000 mg twice a day.  He has continued to be on aspirin and Plavix for antiplatelet therapy post CABG revascularization.  With his exertional dyspnea and 15 years status post CABG revascularization surgery, I have recommended a follow-up echo Doppler study as well as a [employer] study for further assessment of potential ischemic etiology.  He continues to be on rosuvastatin 20 mg and Zetia 10 mg for hyperlipidemia.  LDL cholesterol was excellent at 40 in September 2020.  He has a history of sleep apnea followed remotely by Dr. Annamaria Boots and had 3 L of supplemental oxygen.  He no longer is using treatment.  He has been off CPAP for several years.  We discussed potential reassessment for sleep apnea but at present he does not have much interest.  He is on Metformin for type 2 diabetes mellitus.  I will see him in follow-up of Daniel above studies and further recommendations will be made at that time.  Time spent: 30 minutes  Troy Sine, MD, [facility] 08/30/2019 6:32 PM

## 2019-08-28 NOTE — Patient Instructions (Signed)
Medication Instructions:  No changes *If you need a refill on your cardiac medications before your next appointment, please call your pharmacy*  Lab Work: None ordered If you have labs (blood work) drawn today and your tests are completely normal, you will receive your results only by: Marland Kitchen MyChart Message (if you have MyChart) OR . A paper copy in the mail If you have any lab test that is abnormal or we need to change your treatment, we will call you to review the results.  Testing/Procedures: Your physician has requested that you have an echocardiogram. Echocardiography is a painless test that uses sound waves to create images of your heart. It provides your doctor with information about the size and shape of your heart and how well your heart's chambers and valves are working. You may receive an ultrasound enhancing agent through an IV if needed to better visualize your heart during the echo.This procedure takes approximately one hour. There are no restrictions for this procedure. This will take place at the [address]   Your physician has requested that you have a lexiscan myoview. For further information please visit [email or web address]. Please follow instruction sheet, as given. This will take place at [place], [address]  How to prepare for your Myocardial Perfusion Test:  Do not eat or drink 3 hours prior to your test, except you may have water.  Do not consume products containing caffeine (regular or decaffeinated) 12 hours prior to your test. (ex: coffee, chocolate, sodas, tea).  Do bring a list of your current medications with you.  If not listed below, you may take your medications as normal.  Do wear comfortable clothes (no dresses or overalls) and walking shoes, tennis shoes preferred (No heels or open toe shoes are allowed).  Do NOT wear cologne, perfume, aftershave, or lotions (deodorant is allowed).  The test will take approximately 3 to 4 hours  to complete  If these instructions are not followed, your test will have to be rescheduled.    Follow-Up: At [facility], you and your health needs are our priority.  As part of our continuing mission to provide you with exceptional heart care, we have created designated Provider Care Teams.  These Care Teams include your primary Cardiologist (physician) and Advanced Practice Providers (APPs -  Physician Assistants and Nurse Practitioners) who all work together to provide you with the care you need, when you need it.  Your next appointment:   3 month(s)  The format for your next appointment:   In Person  Provider:   You may see Dr. Claiborne Billings or one of the following Advanced Practice Providers on your designated Care Team:    Almyra Deforest, PA-C  Fabian Sharp, [place] or   Roby Lofts, [place]   Other Instructions A referral has been placed for [facility]

## 2019-08-30 ENCOUNTER — Telehealth (HOSPITAL_COMMUNITY): Payer: Self-pay

## 2019-08-30 ENCOUNTER — Other Ambulatory Visit: Payer: Self-pay | Admitting: Cardiovascular Disease

## 2019-08-30 ENCOUNTER — Encounter: Payer: Self-pay | Admitting: Cardiovascular Disease

## 2019-08-30 ENCOUNTER — Other Ambulatory Visit (INDEPENDENT_AMBULATORY_CARE_PROVIDER_SITE_OTHER): Payer: Medicare Other

## 2019-08-30 DIAGNOSIS — R0602 Shortness of breath: Secondary | ICD-10-CM

## 2019-08-30 DIAGNOSIS — I2510 Atherosclerotic heart disease of native coronary artery without angina pectoris: Secondary | ICD-10-CM

## 2019-08-30 DIAGNOSIS — I25810 Atherosclerosis of coronary artery bypass graft(s) without angina pectoris: Secondary | ICD-10-CM

## 2019-08-30 DIAGNOSIS — G450 Vertebro-basilar artery syndrome: Secondary | ICD-10-CM

## 2019-08-30 DIAGNOSIS — G459 Transient cerebral ischemic attack, unspecified: Secondary | ICD-10-CM

## 2019-08-30 NOTE — Telephone Encounter (Signed)
Encounter complete. 

## 2019-09-04 ENCOUNTER — Ambulatory Visit (HOSPITAL_COMMUNITY)
Admission: RE | Admit: 2019-09-04 | Payer: Medicare Other | Source: Ambulatory Visit | Attending: Cardiovascular Disease | Admitting: Cardiovascular Disease

## 2019-09-07 ENCOUNTER — Ambulatory Visit (HOSPITAL_COMMUNITY): Payer: Medicare Other | Attending: Cardiovascular Disease

## 2019-09-07 ENCOUNTER — Other Ambulatory Visit: Payer: Self-pay

## 2019-09-07 ENCOUNTER — Other Ambulatory Visit (HOSPITAL_COMMUNITY): Payer: Medicare Other

## 2019-09-07 DIAGNOSIS — R0602 Shortness of breath: Secondary | ICD-10-CM | POA: Diagnosis not present

## 2019-09-11 ENCOUNTER — Telehealth (HOSPITAL_COMMUNITY): Payer: Self-pay

## 2019-09-11 NOTE — Telephone Encounter (Signed)
Encounter complete. 

## 2019-09-12 ENCOUNTER — Telehealth (HOSPITAL_COMMUNITY): Payer: Self-pay

## 2019-09-12 NOTE — Telephone Encounter (Signed)
Encounter complete. 

## 2019-09-13 ENCOUNTER — Other Ambulatory Visit: Payer: Self-pay

## 2019-09-13 ENCOUNTER — Ambulatory Visit (HOSPITAL_COMMUNITY)
Admission: RE | Admit: 2019-09-13 | Discharge: 2019-09-13 | Disposition: A | Discharge status: Home / Self Care | Payer: Medicare Other | Source: Ambulatory Visit | Attending: Cardiology | Admitting: Cardiology

## 2019-09-13 DIAGNOSIS — R0602 Shortness of breath: Secondary | ICD-10-CM

## 2019-09-13 DIAGNOSIS — Z951 Presence of aortocoronary bypass graft: Secondary | ICD-10-CM | POA: Diagnosis present

## 2019-09-13 LAB — MYOCARDIAL PERFUSION IMAGING
LV dias vol: 92 mL (ref 62–150)
LV sys vol: 47 mL
Peak HR: 70 {beats}/min
Rest HR: 63 {beats}/min
SDS: 3
SRS: 0
SSS: 3
TID: 1.12

## 2019-09-13 MED ORDER — TECHNETIUM TC 99M TETROFOSMIN IV KIT
10.9000 | PACK | Freq: Once | INTRAVENOUS | Status: AC | PRN
  Administered 2019-09-13: 10.9 via INTRAVENOUS
  Filled 2019-09-13: qty 11

## 2019-09-13 MED ORDER — REGADENOSON 0.4 MG/5ML IV SOLN
0.4000 mg | Freq: Once | INTRAVENOUS | Status: AC
  Administered 2019-09-13: 0.4 mg via INTRAVENOUS

## 2019-09-13 MED ORDER — TECHNETIUM TC 99M TETROFOSMIN IV KIT
31.0000 | PACK | Freq: Once | INTRAVENOUS | Status: AC | PRN
  Administered 2019-09-13: 31 via INTRAVENOUS
  Filled 2019-09-13: qty 31

## 2019-09-19 ENCOUNTER — Telehealth: Payer: Self-pay | Admitting: Cardiovascular Disease

## 2019-09-19 NOTE — Telephone Encounter (Signed)
PT's wife on DPR advised Echo and stress test results and verbalized understanding.

## 2019-09-19 NOTE — Telephone Encounter (Signed)
Patient's wife is returning call in regards to echocardiogram and myocardial perfusion results. Please call.

## 2019-09-25 ENCOUNTER — Telehealth: Payer: Self-pay

## 2019-09-25 NOTE — Telephone Encounter (Signed)
Patient's wife is returning call. 

## 2019-09-25 NOTE — Telephone Encounter (Signed)
Spoke with patient's wife regarding pt's  Echo and stress test results. Notified pt that per Dr.Kelly the echo shows "Normal in function with EF 6065%. Wall motion abnormality involving the basal anteroseptal wall. Grade 1 diastolic dysfunction. Mild aortic valve sclerosis without stenosis." and the stress test shows "Low risk nuclear perfusion study without ischemia. Mild small defect in the basal anteroseptal basal inferoseptal wall."  Explained to the wife that this meant Per Dr. Claiborne Billings, your stress test is low risk. And Per Dr. Claiborne Billings, your heart pumping function is normal with EF 60-65%. There is mild stiffening of heart muscle - meaning it does not relax as easily. There is mild aortic valve calcification/stiffening but no narrowing. No changes needed at this time.  No questions or concerns regarding these results at this time.

## 2019-09-25 NOTE — Telephone Encounter (Signed)
LVMTCB 2/2

## 2019-10-23 ENCOUNTER — Encounter: Payer: Self-pay | Admitting: Neurology

## 2019-10-23 ENCOUNTER — Other Ambulatory Visit: Payer: Self-pay

## 2019-10-23 ENCOUNTER — Ambulatory Visit (INDEPENDENT_AMBULATORY_CARE_PROVIDER_SITE_OTHER): Payer: Medicare Other | Admitting: Neurology

## 2019-10-23 VITALS — BP 143/80 | HR 66 | Temp 97.0°F | Ht 70.0 in | Wt 206.5 lb

## 2019-10-23 DIAGNOSIS — R42 Dizziness and giddiness: Secondary | ICD-10-CM | POA: Diagnosis not present

## 2019-10-23 DIAGNOSIS — R413 Other amnesia: Secondary | ICD-10-CM | POA: Insufficient documentation

## 2019-10-23 NOTE — Progress Notes (Signed)
PATIENT: Daniel Singleton DOB: 25-Nov-1940  Chief Complaint  Patient presents with  . Gait Problem/Dizziness    Othostatic Vitals: Lying:143/80, 66, Sitting:143/83, 73, Standing:125/65, 66, Standing x 3:133/73, 67. He is here with his wife, Daniel Singleton. Hx of CVA. He has developed an unsteady gait and positional dizziness. Reports having one fall when getting out of bed one night to use the bathroom.   Marland Kitchen PCP    Daniel Smoker, MD     HISTORICAL Daniel Singleton is a 79 year old male accompanied by his wife, referred by his primary care physician Sela Hilding for evaluation of dizziness,evaluation was on October 23, 2019.  I have reviewed previous record, he had past medical history of hypertension, hyperlipidemia, diabetes, CAD, status post CABG, obstructive sleep apnea, prostate cancer  He was seen by Dr. Erlinda Hong in July 2015 for acute onset of vertigo, nausea, vomiting, at emergency room, he was found to have nystagmus, difficulty with gait  I personally reviewed MRI in 2015, no acute abnormality, during hospital stay, he continue complains of dizziness, lightheadedness, but no longer has vertigo  He was taking aspirin, and Plavix following his coronary artery disease, Plavix was on hold for abdominal procedure when he had sudden onset vertigo in July 2015, since then, he has been on double antiplatelet agent  Later following up he no longer has vertigo, but he complains of intermittent dizziness, foggy brain sensation over the past few years, getting worse since October 2020, is regardless of his position, he felt that with in a sitting down, lying down, or standing up position  He moved to [place] since 2014, has been sedentary since then, now he spent most of the day in a sitting position, short winded after walking for 10 minutes, he used to be a heavy Singleton, 1 pack a day, quit about 10 years ago, has a diagnosis of COPD, was given oxygen in the past, but somehow it was stopped, he has  lost follow-up with his pulmonologist, also has a history of obstructive sleep apnea, could not tolerate his CPAP machine, in addition, his wife reported that he drink excessive amount of caffeine, a pot of coffee, with espresso, not sleeping well at nighttime, frequent napping during the day  He does complains of bilateral lower extremity numbness tingling, especially on his right foot and ankle, where he has suffered significant injury in the past   I personally reviewed MRI brain in Sept 2018 compared to previous MRI in 2015, no acute abnormality, generalized atrophy, slight supratentorium small vessel disease, MRI of brain in 2015 showed no large vessel disease  Ultrasound of carotid artery in 2018 showed less than 39% stenosis of bilateral carotid artery, vertebral artery were anterograde flow Echocardiogram in January 2021: Ejection fraction 60 to 65%, severe hypokinesis of left ventricle, basal anteroseptal wall,  REVIEW OF SYSTEMS: Full 14 system review of systems performed and notable only for as above All other review of systems were negative.  ALLERGIES: Allergies  Allergen Reactions  . Niacin And Related Itching    Reaction to Rx strength only, OTC is okay    HOME MEDICATIONS: Current Outpatient Medications  Medication Sig Dispense Refill  . albuterol (PROVENTIL HFA;VENTOLIN HFA) 108 (90 Base) MCG/ACT inhaler Inhale 2 puffs into the lungs every 6 (six) hours as needed for wheezing. 3 Inhaler 3  . aspirin EC 81 MG tablet Take 1 tablet (81 mg total) by mouth daily. 90 tablet 3  . budesonide-formoterol (SYMBICORT) 160-4.5 MCG/ACT inhaler Inhale 2  puffs into the lungs 2 (two) times daily.    . clopidogrel (PLAVIX) 75 MG tablet TAKE 1 TABLET BY MOUTH  DAILY 90 tablet 3  . ezetimibe (ZETIA) 10 MG tablet TAKE 1 TABLET BY MOUTH  EVERY MORNING 90 tablet 3  . Ibuprofen-Diphenhydramine Cit ([employer] IBUPROFEN PM) 200-38 MG TABS Take by mouth. As needed    . metFORMIN (GLUCOPHAGE) 500 MG  tablet Take 500 mg by mouth 2 (two) times daily.  2  . metoprolol succinate (TOPROL-XL) 25 MG 24 hr tablet TAKE 1 AND 1/2 TABLETS BY  MOUTH DAILY 135 tablet 3  . midodrine (PROAMATINE) 2.5 MG tablet TAKE 1 TABLET BY MOUTH TWO  TIMES DAILY WITH A MEAL 180 tablet 3  . Multiple Vitamins-Minerals (PRESERVISION AREDS 2 PO) Take 2 capsules by mouth daily.    . nitroGLYCERIN (NITROSTAT) 0.3 MG SL tablet DISSOLVE 1 TABLET UNDER THE TONGUE EVERY 5 MINUTES AS  NEEDED FOR CHEST PAIN (MAX  3 TABS IN 15 MINUTES, CALL  911 IF CHEST PAIN PERSISTS) 100 tablet 3  . omega-3 acid ethyl esters (LOVAZA) 1 g capsule TAKE 1 CAPSULE BY MOUTH  DAILY 90 capsule 3  . ranolazine (RANEXA) 1000 MG SR tablet TAKE 1 TABLET BY MOUTH TWO  TIMES DAILY 180 tablet 3  . rosuvastatin (CRESTOR) 20 MG tablet TAKE 1 TABLET BY MOUTH  DAILY 90 tablet 3   No current facility-administered medications for this visit.    PAST MEDICAL HISTORY: Past Medical History:  Diagnosis Date  . Angina at rest [facility])   . Anxiety   . Arthritis   . Asthma   . Coronary artery disease    recently started seeing Dr. Claiborne Billings   . Depression   . Diabetes ([place])   . Dizziness   . Elevated PSA   . Emphysema   . GERD (gastroesophageal reflux disease)   . Hx of radiation therapy 09/20/13- 11/14/13   prostate 7800 cGy 40 sessions, seminal vesicles 5600 cGy in 40 sessions  . Hypercholesteremia   . Hypertension   . PONV (postoperative nausea and vomiting)   . Prostate cancer ([place]) 05/23/13   gleason 3+4=7, volume 31 cc  . Sleep apnea    c pap with oxygen at 2.5 L    PAST SURGICAL HISTORY: Past Surgical History:  Procedure Laterality Date  . BACK SURGERY  2006   discectomy lumbar  . BACK SURGERY    . BACK SURGERY    . degloving rt leg injury  2000   had skin grafts  . FOOT SURGERY    . heart bypass  2006   Quad  . HERNIA REPAIR  2008   x 3 w/mesh, bilat inguinla, umbilical  . PROSTATE BIOPSY N/A 05/23/2013   Procedure: BIOPSY TRANSRECTAL  ULTRASONIC PROSTATE (TUBP);  Surgeon: Molli Hazard, MD;  Location: [facility];  Service: Urology;  Laterality: N/A;  prostate nerve block  . TONSILLECTOMY     w/adenoidectomy    FAMILY HISTORY: Family History  Problem Relation Age of Onset  . Heart disease Mother   . Heart attack Mother   . Cancer Sister   . Depression Sister     SOCIAL HISTORY: Social History   Socioeconomic History  . Marital status: Married    Spouse name: Not on file  . Number of children: 2  . Years of education: college  . Highest education level: Not on file  Occupational History  . Occupation: Retired   Tobacco Use  . Smoking status:  Former Singleton    Packs/day: 1.00    Years: 40.00    Pack years: 40.00    Types: Cigarettes    Quit date: 01/17/1999    Years since quitting: 20.7  . Smokeless tobacco: Never Used  Substance and Sexual Activity  . Alcohol use: No    Alcohol/week: 0.0 standard drinks  . Drug use: No  . Sexual activity: Never  Other Topics Concern  . Not on file  Social History Narrative   Patient is married with 2 children.   Patient is right handed.   Patient has college education.   Patient drinks 12 cups daily.   Social Determinants of Health   Financial Resource Strain:   . Difficulty of Paying Living Expenses: Not on file  Food Insecurity:   . Worried About Charity fundraiser in the Last Year: Not on file  . Ran Out of Food in the Last Year: Not on file  Transportation Needs:   . Lack of Transportation (Medical): Not on file  . Lack of Transportation (Non-Medical): Not on file  Physical Activity:   . Days of Exercise per Week: Not on file  . Minutes of Exercise per Session: Not on file  Stress:   . Feeling of Stress : Not on file  Social Connections:   . Frequency of Communication with Friends and Family: Not on file  . Frequency of Social Gatherings with Friends and Family: Not on file  . Attends Religious Services: Not on file  . Active Member of Clubs  or Organizations: Not on file  . Attends Archivist Meetings: Not on file  . Marital Status: Not on file  Intimate Partner Violence:   . Fear of Current or Ex-Partner: Not on file  . Emotionally Abused: Not on file  . Physically Abused: Not on file  . Sexually Abused: Not on file     PHYSICAL EXAM   Vitals:   10/23/19 0729  BP: (!) 143/80  Pulse: 66  Temp: (!) 97 F (36.1 C)  Weight: 206 lb 8 oz (93.7 kg)  Height: 5\' 10"  (1.778 m)    Not recorded      Body mass index is 29.63 kg/m.  PHYSICAL EXAMNIATION:  Gen: NAD, conversant, well nourised, well groomed                     Cardiovascular: Regular rate rhythm, no peripheral edema, warm, nontender. Eyes: Conjunctivae clear without exudates or hemorrhage Neck: Supple, no carotid bruits. Pulmonary: Clear to auscultation bilaterally   NEUROLOGICAL EXAM:  MENTAL STATUS: Tired looking elderly gentleman  MMSE - Mini Mental State Exam 10/23/2019  Orientation to time 5  Orientation to Place 5  Registration 3  Attention/ Calculation 5  Recall 3  Language- name 2 objects 2  Language- repeat 1  Language- follow 3 step command 3  Language- read & follow direction 1  Write a sentence 1  Copy design 1  Total score 30     CRANIAL NERVES: CN II: Visual fields are full to confrontation. Pupils are round equal and briskly reactive to light. CN III, IV, VI: extraocular movement are normal. No ptosis. CN V: Facial sensation is intact to light touch CN VII: Face is symmetric with normal eye closure  CN VIII: Hearing is normal to causal conversation. CN IX, X: Phonation is normal. CN XI: Head turning and shoulder shrug are intact  MOTOR: There is no pronator drift of out-stretched arms. Muscle bulk  and tone are normal. Muscle strength is normal.  REFLEXES: Reflexes are 2+ and symmetric at the biceps, triceps, knees, and absent at ankles. Plantar responses are flexor.  SENSORY: Length dependent decreased to  light touch, pinprick and vibratory sensation to ankle level  COORDINATION: There is no trunk or limb dysmetria noted.  GAIT/STANCE: He can get up from seated position arms crossed,   DIAGNOSTIC DATA (LABS, IMAGING, TESTING) - I reviewed patient records, labs, notes, testing and imaging myself where available.   ASSESSMENT AND PLAN  Daniel Singleton is a 79 y.o. male   Dizziness  Not  vertigo, mild orthostatic blood pressure changes at today's examination, lying blood pressure 143/80, heart rate of 66, sitting, 143/83, 73; standing 125/65, heart rate of 66, standing for 3 minutes, 133/73, heart rate of 67  He does have history of diabetes, evidence of distal sensory loss, suggestive of diabetic peripheral neuropathy  His complaints of dizziness are likely multifactorial, orthostatic blood pressure changes, deconditioning, COPD, hypovolemia due to excessive caffeine intake, poor sleep quality, likely all plays a role  I have suggested him moderate exercise, decrease caffeine intake, increase water intake  Laboratory evaluations, check thyroid function  Ultrasound of carotid artery   Daniel Singleton, M.D. Ph.D.  [facility] [address] [place], [place] [address] Ph: [phone] Fax: [phone]  CC: Referring Provider

## 2019-10-24 LAB — VITAMIN B12: Vitamin B-12: 703 pg/mL (ref 232–1245)

## 2019-10-24 LAB — TSH: TSH: 2.61 u[IU]/mL (ref 0.450–4.500)

## 2019-10-30 ENCOUNTER — Other Ambulatory Visit: Payer: Self-pay

## 2019-10-30 ENCOUNTER — Ambulatory Visit (HOSPITAL_COMMUNITY)
Admission: RE | Admit: 2019-10-30 | Discharge: 2019-10-30 | Disposition: A | Discharge status: Home / Self Care | Payer: Medicare Other | Source: Ambulatory Visit | Attending: Neurology | Admitting: Neurology

## 2019-10-30 DIAGNOSIS — R42 Dizziness and giddiness: Secondary | ICD-10-CM | POA: Insufficient documentation

## 2019-10-30 NOTE — Progress Notes (Signed)
Mr. Juszczyk:  Ultrasound of carotid arteries showed mild bilateral internal carotid artery stenosis.  Please keep the treatment plan as previously discussed.  Marcial Pacas, M.D. Ph.D.  [facility] [place], [place] [address] Phone: [phone] Fax:      [phone]

## 2019-10-30 NOTE — Progress Notes (Signed)
Carotid duplex has been completed.   Preliminary results in CV Proc.   Abram Sander 10/30/2019 1:22 PM

## 2019-12-05 ENCOUNTER — Encounter: Payer: Self-pay | Admitting: Cardiovascular Disease

## 2019-12-05 ENCOUNTER — Ambulatory Visit (INDEPENDENT_AMBULATORY_CARE_PROVIDER_SITE_OTHER): Payer: Medicare Other | Admitting: Cardiovascular Disease

## 2019-12-05 ENCOUNTER — Other Ambulatory Visit: Payer: Self-pay

## 2019-12-05 VITALS — BP 111/60 | HR 69 | Ht 70.0 in | Wt 201.8 lb

## 2019-12-05 DIAGNOSIS — I25810 Atherosclerosis of coronary artery bypass graft(s) without angina pectoris: Secondary | ICD-10-CM | POA: Diagnosis not present

## 2019-12-05 DIAGNOSIS — R0600 Dyspnea, unspecified: Secondary | ICD-10-CM | POA: Diagnosis not present

## 2019-12-05 DIAGNOSIS — I2510 Atherosclerotic heart disease of native coronary artery without angina pectoris: Secondary | ICD-10-CM

## 2019-12-05 DIAGNOSIS — E118 Type 2 diabetes mellitus with unspecified complications: Secondary | ICD-10-CM

## 2019-12-05 DIAGNOSIS — R0609 Other forms of dyspnea: Secondary | ICD-10-CM

## 2019-12-05 DIAGNOSIS — I272 Pulmonary hypertension, unspecified: Secondary | ICD-10-CM

## 2019-12-05 DIAGNOSIS — E785 Hyperlipidemia, unspecified: Secondary | ICD-10-CM | POA: Diagnosis not present

## 2019-12-05 DIAGNOSIS — Z951 Presence of aortocoronary bypass graft: Secondary | ICD-10-CM

## 2019-12-05 NOTE — Patient Instructions (Signed)

## 2019-12-05 NOTE — Progress Notes (Signed)
Patient ID: Daniel Singleton, male   DOB: 02-20-1941, 79 y.o.   MRN: [id number]    Primary M.D.: Dr. Laverna Peace at [facility]  HPI:  Daniel Singleton is a 79 year old gentleman who presents for a 3 month follow-up cardiology evaluation.  Daniel Singleton has a history of hypertension, hyperlipidemia, COPD, prior asbestos exposure, diabetes mellitus , and  prostate cancer. In January 2006, he underwent CABG surgery x4  in [facility] by Dr. Zorita Pang. I do not have the specifics of his bypass grafts. He had stress test in [place] in 2013 after he had experienced some recurrent chest pain episodes and a cardiac catheterization which showed mild blockages.  He also has a history of obstructive sleep apnea on CPAP therapy with 3 L of oxygen and is followed by Dr. Keturah Barre.  Daniel Singleton admits to mild shortness of breath.  He has a history of hyperlipidemia.   In the past he had been on isosorbide mononitrate therapy but stopped taking this because of libido concerns and potential need for medications for erectile function.  An echo Doppler study on 04/03/2013 showed an ejection fraction of 55-60%; mild tricuspid regurgitation, and grade 1 diastolic dysfunction. When I saw him, I recommended that we perform advanced lipid testing with NMR lipoprotein of since he has had low HDL levels and her recent cholesterol was 185 triglycerides 153 HDL 31 and LDL 123. At that time, I also started him on Ranexa 500 mg twice a day since he had discontinued his isosorbide. I added Zetia 10 mg to his atorvastatin and recommended that he undergo a followup with NMR lipoprotein assessment.  His NMR study was markedly abnormal. Specifically, his total cholesterol was 187 triglycerides 348 HDL 32 LDL cholesterol 85. However, he had markedly increased goal LDL particles at 1739 status LDL particle number was markedly increased at 2463. The patient tells me he did develop some myalgias on Lipitor and therefore for  approximately the last 3-4 weeks completely discontinued a torus that in therapy.  He was  hospitalized on December 26 through 08/21/2013 with a partial small bowel obstruction. He had experienced nausea and vomiting with diarrhea for 4 days prior to his admission and was found to have a partial mid-to high grade small bowel obstruction with a transition point in the right lower quadrant. He was cleared for surgery and if surgery was to be done he was to stop his Plavix and continue aspirin. Fortunately, his bowel obstruction resolved with medical management and surgical intervention was not necessary.  I'd seen him in January 2015 at which time he was off Plavix therapy and was not having any anginal symptoms on his current medical regimen.  He was hospitalized on 03/19/2014 through 03/21/2014 with ataxia.  He had complaints of vertigo with nausea, vomiting, double vision, numbness, and weakness.  A CT of the brain did not show acute intracranial abnormalities, but did show chronic atrophy and small vessel ischemic changes.  A 2-D echo Doppler study showed an EF of 45-50% without cardiac source of emboli.  Carotid studies revealed mild bilateral plaque with narrowing of 1-39% range.  Antegrade flow is vertebral artery.  Doppler of the brachial artery demonstrate triphasic waveforms.  He was felt possibly to have a posterior circulation TIA.  His aspirin 81 mg was increased to 325 mg.  and I last saw him in August 2015 due to his recent TIA.  I recommended reinitiation of Plavix 75 mg daily and reduced his aspirin  dose back to 81 mg.  When I saw several months ago he denied any significant chest pain but had experienced one short episode in February that was nonexertional.  However, he had noticed increasing exertional shortness of breath with activity.  He had stopped using his CPAP therapy and complained of increased fatigue.  He had not seen Dr. Annamaria Boots in some time and I recommended that when he saw him in  July that consideration for follow-up sleep study.  He at home.  At that time, he was planning to undergo cataract surgery sometime this fall.  Due to his exertional shortness of breath and his established coronary artery disease.  I recommended a nuclear perfusion study.  This was done on 02/28/2015 and was intermediate risk.  He developed mild ST segment depression and had occasional PACs and PVCs.  Scintigraphic images revealed a medium defect of moderate severity in the RCA distribution.  He also had an abnormal blood pressure response with hypotension during stress.  I was able to obtain the records from [facility] to see if he had any significant inferior defect on prior nuclear studies.  Review his records dating back to 2006 including catheterization studies and subsequent nuclear imaging studies.  On the majority of his nuclear studies dating from 2007.  He was found to have mild fixed basal posterior lateral defect and there was some concern of possible attenuation artifact.  He was not found to have significant ischemia.  A nuclear study in 2011 was interpreted as indeterminate for ischemia with motion artifact.  His last study in [place] was in 2013 which again showed a fixed inferior defect and was felt that he had a low probability of ischemia.   He had seen a neurologist for evaluation of confusion, and staggering gait.  He also had an episode of dizziness yesterday and when he checked his blood pressure.  This was stable at 118/67 with a heart rate at 74.  Since I last saw him, he tells me he has not been using his CPAP for some time.  He dotes some  weakness when he stands abruptly.  He denies any recent chest pain.  He is unaware of palpitations.  He underwent an echo Doppler study on 10/28/2016 which showed an EF 55-60%.  There were no wall motion abnormalities.  There was grade 1 diastolic dysfunction.  PA pressure estimate was 35 mm.  On 10/29/2016, he underwent a nuclear stress test where  his EF was reported at 49%.  No ischemia was noted.  He also underwent carotid duplex imaging.  On 10/29/2016 which showed heterogeneous plaque bilaterally with 1-39%, mild bilateral carotid stenoses, not felt to be significant.  He had normal subclavian arteries, and patent vertebral arteries with antegrade flow.    When I  saw him in March 2018, he had significant orthostatic hypotension and his blood pressure dropped from 114/70 supine to 88/62 standing.  I reduced and ultimately discontinued isosorbide.  Off this therapy, he has not had any recurrent anginal symptomatology.  I started him on Midodrin at 2.5 mg initially twice a day.  I recommended compression stockings to both legs with at least 20-30 mm of support.  With this adjustment, he felt significantly improved.  At his follow-up office visit in May 2018 he was no longer orthostatic and had resolution of prior symptoms.    When I saw him in September 2018 he was doing well and was without recurrent chest pain.  At that time, he was  wondering about trying to wean and possibly get off ranolazine.  He was concerned about the continued high cost.  As result he reduced renal dosing from 1000 mg twice a day to 500 mg twice a day.  However, on the reduced dose he noticed recurrent anginal symptomatology and as result is now back up to 1000 mg twice a day with complete resolution of prior symptoms.  He was not been using his CPAP therapy and was been sleeping in recliner.  When I saw him in April 2019 at which time I stressed the importance of trying to resume CPAP therapy.   His blood pressure was increased and I recommended slight titration of Toprol back to 37.5 mg daily.  He has been on Crestor 20 and Zetia 10 mg for hyperlipidemia.  I suggested he discontinue niacin but continue lovaza.  He has not used CPAP therapy and has no intention at present to reconsider this option.   When I last saw him in January 2020 he was seeing Dr. Nancy Fetter for his primary  care at [facility] [address] and was no longer sees Dr. Corky Sox at [facility].  Reportedly labs were excellent .   He denied any recurrent anginal symptoms and continues to take now generic Ranexa 1000 mg twice daily and Toprol-XL 12.5 mg in the morning and 25 mg in the evening for a total dose of 37.5 mg daily which was adjusted at his last office visit 3 months ago.   He continues to be on aspirin and Plavix for dual antiplatelet therapy.  He is on Zetia and rosuvastatin for hyperlipidemia in addition to omega-3 fatty acids.    He has developed dry macular degeneration and is now essentially blind in his left eye and has some involvement in his right eye.  He is no longer seeing Dr. Nancy Fetter for his primary care and has seen Dr. Lindell Noe at [place]. Last saw him in January 2021 at which time he had been experiencing exertional shortness of breath he did not admits to exertional shortness of breath.  He denied any episodes of chest tightness.  He was on metoprolol 37.5 mg daily, ranolazine 1000 mg twice a day, aspirin/Plavix for his CAD.  He was on Zetia and rosuvastatin for hyperlipidemia.    During that evaluation I recommended he undergo a follow-up echo Doppler study as well as a nuclear perfusion study. His echo Doppler study done on 09/07/2019 showed an EF of 60 to 65% with hypokinesis of the basal anteroseptal wall consistent with occlusion of his LAD artery and patent bypass graft to distal LAD. He had grade 1 diastolic dysfunction. PA pressure was increased estimated at 40 mmHg. Was mild aortic sclerosis without stenosis. Nuclear perfusion study was low risk and showed a nuclear stress EF at 48% there were no ST segment deviation during stress. Mild defect was present in the basal anteroseptal and basal inferolateral septal location without ischemia.  Presently he feels well. He denies recurrent chest pain. He continues to experience mild shortness of breath with exertion. He has  had allergies and does wheeze particularly during this pollen season. He has mild chronic right ankle swelling since his degloving injury years ago. He presents for evaluation  Past Medical History:  Diagnosis Date  . Angina at rest [facility])   . Anxiety   . Arthritis   . Asthma   . Coronary artery disease    recently started seeing Dr. Claiborne Billings   . Depression   .  Diabetes ([place])   . Dizziness   . Elevated PSA   . Emphysema   . GERD (gastroesophageal reflux disease)   . Hx of radiation therapy 09/20/13- 11/14/13   prostate 7800 cGy 40 sessions, seminal vesicles 5600 cGy in 40 sessions  . Hypercholesteremia   . Hypertension   . PONV (postoperative nausea and vomiting)   . Prostate cancer ([place]) 05/23/13   gleason 3+4=7, volume 31 cc  . Sleep apnea    c pap with oxygen at 2.5 L    Past Surgical History:  Procedure Laterality Date  . BACK SURGERY  2006   discectomy lumbar  . BACK SURGERY    . BACK SURGERY    . degloving rt leg injury  2000   had skin grafts  . FOOT SURGERY    . heart bypass  2006   [place]  . HERNIA REPAIR  2008   x 3 w/mesh, bilat inguinla, umbilical  . PROSTATE BIOPSY N/A 05/23/2013   Procedure: BIOPSY TRANSRECTAL ULTRASONIC PROSTATE (TUBP);  Surgeon: Molli Hazard, MD;  Location: [facility];  Service: Urology;  Laterality: N/A;  prostate nerve block  . TONSILLECTOMY     w/adenoidectomy    Allergies  Allergen Reactions  . Niacin And Related Itching    Reaction to Rx strength only, OTC is okay    Current Outpatient Medications  Medication Sig Dispense Refill  . albuterol (PROVENTIL HFA;VENTOLIN HFA) 108 (90 Base) MCG/ACT inhaler Inhale 2 puffs into the lungs every 6 (six) hours as needed for wheezing. 3 Inhaler 3  . aspirin EC 81 MG tablet Take 1 tablet (81 mg total) by mouth daily. 90 tablet 3  . budesonide-formoterol (SYMBICORT) 160-4.5 MCG/ACT inhaler Inhale 2 puffs into the lungs 2 (two) times daily.    . clopidogrel (PLAVIX) 75 MG tablet TAKE 1 TABLET  BY MOUTH  DAILY 90 tablet 3  . ezetimibe (ZETIA) 10 MG tablet TAKE 1 TABLET BY MOUTH  EVERY MORNING 90 tablet 3  . Ibuprofen-Diphenhydramine Cit ([employer] IBUPROFEN PM) 200-38 MG TABS Take by mouth. As needed    . metFORMIN (GLUCOPHAGE) 500 MG tablet Take 500 mg by mouth 2 (two) times daily.  2  . metoprolol succinate (TOPROL-XL) 25 MG 24 hr tablet Take 25 mg by mouth daily. Take 1/2 table in the morning and 1/2 tablets in the evening    . midodrine (PROAMATINE) 2.5 MG tablet TAKE 1 TABLET BY MOUTH TWO  TIMES DAILY WITH A MEAL 180 tablet 3  . nitroGLYCERIN (NITROSTAT) 0.3 MG SL tablet DISSOLVE 1 TABLET UNDER THE TONGUE EVERY 5 MINUTES AS  NEEDED FOR CHEST PAIN (MAX  3 TABS IN 15 MINUTES, CALL  911 IF CHEST PAIN PERSISTS) 100 tablet 3  . omega-3 acid ethyl esters (LOVAZA) 1 g capsule TAKE 1 CAPSULE BY MOUTH  DAILY 90 capsule 3  . ranolazine (RANEXA) 1000 MG SR tablet TAKE 1 TABLET BY MOUTH TWO  TIMES DAILY 180 tablet 3  . rosuvastatin (CRESTOR) 20 MG tablet TAKE 1 TABLET BY MOUTH  DAILY 90 tablet 3   No current facility-administered medications for this visit.    Social history is notable in that he is married to his 2 children. He smoked one pack of cigarettes per day until 2000. He does drink occasional alcohol. He does walk  Family History  Problem Relation Age of Onset  . Heart disease Mother   . Heart attack Mother   . Cancer Sister   . Depression Sister  ROS General: Negative; No fevers, chills, or night sweats;  HEENT: Positive for macular degeneration, sinus congestion, difficulty swallowing Pulmonary: Positive for asbestosis exposure No cough, wheezing, shortness of breath, hemoptysis Cardiovascular: Negative; No chest pain, presyncope, syncope, palpitations GI: Negative; No nausea, vomiting, diarrhea, or abdominal pain GU: Positive for prostate CA No dysuria, hematuria, or difficulty voiding Musculoskeletal: Remote degloving injury; no myalgias, joint pain, or  weakness Hematologic/Oncology: Negative; no easy bruising, bleeding Endocrine: Negative; no heat/cold intolerance; no diabetes Neuro: See history of present illness, recent ataxia, probable posterior circulation TIA Skin: Negative; No rashes or skin lesions Psychiatric: Negative; No behavioral problems, depression Sleep: Positive for obstructive sleep apnea with supplemental 3 L oxygen; he stopped using CPAP, apparently due to difficulties with his mask and potential mask leak; since he stopped using CPAP he notes more fatigue, daytime sleepiness,; no bruxism, restless legs, hypnogognic hallucinations, no cataplexy Other comprehensive 14 point system review is negative.   PE BP 111/60   Pulse 69   Ht 5' 10"  (1.778 m)   Wt 201 lb 12.8 oz (91.5 kg)   SpO2 95%   BMI 28.96 kg/m    Repeat blood pressure by me 126/68  Wt Readings from Last 3 Encounters:  12/05/19 201 lb 12.8 oz (91.5 kg)  10/23/19 206 lb 8 oz (93.7 kg)  09/13/19 201 lb (91.2 kg)    BP 111/60   Pulse 69   Ht 5' 10"  (1.778 m)   Wt 201 lb 12.8 oz (91.5 kg)   SpO2 95%   BMI 28.96 kg/m  General: Alert, oriented, no distress.  Skin: normal turgor, no rashes, warm and dry HEENT: Normocephalic, atraumatic. Pupils equal round and reactive to light; sclera anicteric; extraocular muscles intact;  Nose without nasal septal hypertrophy Mouth/Parynx benign; Mallinpatti scale 3 Neck: No JVD, no carotid bruits; normal carotid upstroke Lungs: clear to ausculatation and percussion; no wheezing or rales Chest wall: without tenderness to palpitation Heart: PMI not displaced, RRR, s1 s2 normal, 1/6 systolic murmur, no diastolic murmur, no rubs, gallops, thrills, or heaves Abdomen: Moderate diastases recti; soft, nontender; no hepatosplenomehaly, BS+; abdominal aorta nontender and not dilated by palpation. Back: no CVA tenderness Pulses 2+ Musculoskeletal: full range of motion, normal strength, no joint deformities Extremities:  Right ankle swelling ever since degloving injury which required multiple surgeries; no clubbing cyanosis, Homan's sign negative  Neurologic: grossly nonfocal; Cranial nerves grossly wnl Psychologic: Normal mood and affect   ECG (independently read by me): Normal sinus rhythm with mild sinus arrhythmia at 69 bpm.  No significant ST changes.  Normal intervals  August 28, 2019 ECG (independently read by me): Normal sinus rhythm with mild sinus arrhythmia.  Normal intervals.  No significant ST changes  January 2020 ECG (independently read by me): Sinus rhythm with mild sinus arrhythmia at 73 bpm.  No significant ST-T changes.  October 2019 ECG (independently read by me): Normal sinus rhythm with mild sinus arrhythmia.  Normal intervals.  No ST segment changes.  April 2019 ECG (independently read by me): Normal sinus rhythm with mild sinus arrhythmia.  Heart rate 70 bpm.  Normal intervals.  September 2018 ECG (independently read by me): Normal sinus rhythm at 65 bpm with sinus arrhythmia.  QTc interval 416 ms.  PR interval 170 ms.  May 2018 ECG (independently read by me): Sinus rhythm with occasional PVC.  PR interval 182 ms.  QTc interval 435 ms.  March 2018 ECG (independently read by me): Normal sinus rhythm at 68 bpm, mild  sinus arrhythmia.  Normal intervals.  No significant ST changes.  February 2018 ECG (independently read by me): Normal sinus rhythm with mild sinus arrhythmia.  Heart rate 69.  PAC.  No significant ST changes.  Normal intervals.  December 2016 ECG (independently read by me): Normal sinus rhythm at 71 bpm with  PACs.  QTc interval normal at 439.  June 2016 ECG (independently read by me): Sinus rhythm with sinus arrhythmia, heart rate averaging 68 bpm.  Intervals normal.  December 2015 ECG (independently read by me): Normal sinus rhythm with mild sinus arrhythmia and PACs with ventricular rate at 76 bpm.  QTc interval 427 ms.  No significant ST segment changes.  August  2015 ECG (independently read by me): Sinus rhythm with sinus arrhythmia.  Nonspecific T changes.  QTc interval 421 ms.  08/30/2013 ECG independently read by me today shows normal sinus rhythm with sinus arrhythmia with a ventricular rate in the 80s. They're nonspecific ST-T changes.  Prior ECG in October 2014: Sinus rhythm with mild sinus arrhythmia at 60 beats per minute. PR interval 172 ms. QTC interval 384 ms.  LABS: BMP Latest Ref Rng & Units 10/15/2016 02/17/2015 03/20/2014  Glucose 65 - 99 mg/dL 147(H) 151(H) 152(H)  BUN 7 - 25 mg/dL 14 16 11   Creatinine 0.70 - 1.18 mg/dL 1.00 1.03 0.90  Sodium 135 - 146 mmol/L 137 140 139  Potassium 3.5 - 5.3 mmol/L 4.3 4.4 4.1  Chloride 98 - 110 mmol/L 102 101 99  CO2 20 - 31 mmol/L 26 28 24   Calcium 8.6 - 10.3 mg/dL 9.1 9.3 8.9   Hepatic Function Latest Ref Rng & Units 10/15/2016 02/17/2015 03/20/2014  Total Protein 6.1 - 8.1 g/dL 6.2 6.3 6.2  Albumin 3.6 - 5.1 g/dL 4.0 4.1 3.5  AST 10 - 35 U/L 19 18 19   ALT 9 - 46 U/L 19 16 18   Alk Phosphatase 40 - 115 U/L 29(L) 31(L) 46  Total Bilirubin 0.2 - 1.2 mg/dL 0.7 0.6 0.3   CBC Latest Ref Rng & Units 10/15/2016 02/17/2015 03/20/2014  WBC 3.8 - 10.8 K/uL 6.3 5.3 8.0  Hemoglobin 13.2 - 17.1 g/dL 14.1 14.1 13.2  Hematocrit 38.5 - 50.0 % 41.7 41.2 38.8(L)  Platelets 140 - 400 K/uL 125(L) 137(L) 130(L)   Lab Results  Component Value Date   MCV 91.9 10/15/2016   MCV 89.8 02/17/2015   MCV 88.2 03/20/2014   Lab Results  Component Value Date   TSH 2.610 10/23/2019   Lab Results  Component Value Date   HGBA1C 6.5 (H) 03/20/2014   Lipid Panel     Component Value Date/Time   CHOL 98 10/15/2016 0858   CHOL 118 08/01/2013 0952   TRIG 67 10/15/2016 0858   TRIG 96 08/01/2013 0952   HDL 38 (L) 10/15/2016 0858   HDL 46 08/01/2013 0952   CHOLHDL 2.6 10/15/2016 0858   VLDL 13 10/15/2016 0858   LDLCALC 47 10/15/2016 0858   LDLCALC 53 08/01/2013 0952    Blood work done by Dr. Ardeth Perfect at [facility] from 04/14/2015.  Renal function liver function studies were normal.  Glucose was elevated at 164.  Lipid studies revealed a total cholesterol 121, triglycerides 80, HDL 36, LDL 73.  Hemoglobin A1c was 6.5.  TSH was normal at 2.88.  Apo lipoprotein B was 71.  Recent laboratory from September 2020 showed an LDL cholesterol at 40.  Total cholesterol 92, triglycerides 89 and HDL 35.  Hemoglobin A1c 6.6.  BUN 12,  creatinine 1.02.   RADIOLOGY: No results found.  IMPRESSION:   1. Coronary artery disease involving native coronary artery of native heart without angina pectoris   2. Hx of CABG   3. Hyperlipidemia with target LDL less than 70   4. Exertional dyspnea   5. Type 2 diabetes mellitus with complication, without long-term current use of insulin ([place])   6. Mild pulmonary hypertension ([place])     ASSESSMENT AND PLAN: Mr. Per Daniel Singleton is a 79 year old gentleman who has a history of CAD, hyperlipidemia, diabetes mellitus, COPD, and obstructive sleep apnea. He has a prior tobacco history but he quit on 01/17/1999. He is status post CABG surgery x4 in January 2006 and in 2013 in [place] had abnormal nuclear study.  Repeat catheterization demonstrated some mild blockages.  A prior nuclear study  raised the suspicion for ischemia in the RCA territory.  He had a mild hypotensive response to exercise and had mild ST changes.  He had an abnormal stress test in 2013 which led to his repeat cardiac catheterization.  On prior nuclear studies in [place] he has had consistent inferior defect which oftentimes has been interpreted as diaphragmatic attenuation. His blood pressure today is stable. He has not had any anginal symptomatology continues to be on aspirin and Plavix in addition to metoprolol succinate 12.5 mg twice a day and ranolazine 1000 mg twice a day. He continues to be on low-dose midodrine 2.5 mg twice a day and no longer has orthostatic symptomatology. He is on rosuvastatin 20 mg and Zetia  10 mg for hyperlipidemia with target LDL less than 70. His last lipid studies in September 2020 were excellent with an LDL cholesterol at 40. Reviewed his most recent echo Doppler study with him in detail which shows normal systolic function with hypokinesis of the basal anteroseptal wall and grade 1 diastolic dysfunction. Mild pulmonary hypertension with estimated PA pressure at 40 mm. His nuclear study remain low risk and did not show any ischemia. He is diabetic on metformin. He will continue current therapy. I will see him in 6 months for reevaluation or sooner if problems arise.    Troy Sine, MD, [facility] 12/07/2019 7:00 PM

## 2019-12-07 ENCOUNTER — Encounter: Payer: Self-pay | Admitting: Cardiovascular Disease

## 2019-12-17 ENCOUNTER — Ambulatory Visit
Admission: EM | Admit: 2019-12-17 | Discharge: 2019-12-17 | Disposition: A | Discharge status: Home / Self Care | Payer: Medicare Other | Attending: Emergency Medicine | Admitting: Emergency Medicine

## 2019-12-17 ENCOUNTER — Other Ambulatory Visit: Payer: Self-pay

## 2019-12-17 DIAGNOSIS — R059 Cough, unspecified: Secondary | ICD-10-CM

## 2019-12-17 DIAGNOSIS — R05 Cough: Secondary | ICD-10-CM

## 2019-12-17 DIAGNOSIS — J441 Chronic obstructive pulmonary disease with (acute) exacerbation: Secondary | ICD-10-CM

## 2019-12-17 DIAGNOSIS — Z20822 Contact with and (suspected) exposure to covid-19: Secondary | ICD-10-CM

## 2019-12-17 MED ORDER — PREDNISONE 20 MG PO TABS: 20.0000 mg | ORAL_TABLET | Freq: Two times a day (BID) | ORAL | 0 refills | Status: AC

## 2019-12-17 MED ORDER — AZITHROMYCIN 250 MG PO TABS: 250.0000 mg | ORAL_TABLET | Freq: Every day | ORAL | 0 refills | Status: DC

## 2019-12-17 MED ORDER — BENZONATATE 100 MG PO CAPS: 100.0000 mg | ORAL_CAPSULE | Freq: Three times a day (TID) | ORAL | 0 refills | Status: AC

## 2019-12-17 NOTE — ED Provider Notes (Signed)
[place]   [id number] 12/17/19 Arrival Time: [id number]   CC: COVID symptoms  SUBJECTIVE: History from: patient.  Daniel Singleton is a 79 y.o. male who presents with runny nose, congestion, wheezing, and productive cough with white sputum x 5 days.  COVID exposure last week.  Has tried OTC medications without relief.  Reports previous symptoms in the past with COPD exacerbation.   Denies fever, chills, fatigue, sore throat, SOB, chest pain, nausea, vomiting, changes in bowel or bladder habits.    ROS: As per HPI.  All other pertinent ROS negative.     Past Medical History:  Diagnosis Date  . [facility]   . Anxiety   . Arthritis   . Asthma   . Coronary artery disease    recently started seeing Dr. Claiborne Billings   . Depression   . Diabetes ([place])   . Dizziness   . Elevated PSA   . Emphysema   . GERD (gastroesophageal reflux disease)   . Hx of radiation therapy 09/20/13- 11/14/13   prostate 7800 cGy 40 sessions, seminal vesicles 5600 cGy in 40 sessions  . Hypercholesteremia   . Hypertension   . PONV (postoperative nausea and vomiting)   . Prostate cancer ([place]) 05/23/13   gleason 3+4=7, volume 31 cc  . Sleep apnea    c pap with oxygen at 2.5 L   Past Surgical History:  Procedure Laterality Date  . BACK SURGERY  2006   discectomy lumbar  . BACK SURGERY    . BACK SURGERY    . degloving rt leg injury  2000   had skin grafts  . FOOT SURGERY    . heart bypass  2006   [facility]  . HERNIA REPAIR  2008   x 3 w/mesh, bilat inguinla, umbilical  . PROSTATE BIOPSY N/A 05/23/2013   Procedure: BIOPSY TRANSRECTAL ULTRASONIC PROSTATE (TUBP);  Surgeon: Molli Hazard, MD;  Location: [facility] ORS;  Service: Urology;  Laterality: N/A;  prostate nerve block  . TONSILLECTOMY     w/adenoidectomy   Allergies  Allergen Reactions  . Niacin And Related Itching    Reaction to Rx strength only, OTC is okay   No current facility-administered medications on file prior to encounter.    Current Outpatient Medications on File Prior to Encounter  Medication Sig Dispense Refill  . albuterol (PROVENTIL HFA;VENTOLIN HFA) 108 (90 Base) MCG/ACT inhaler Inhale 2 puffs into the lungs every 6 (six) hours as needed for wheezing. 3 Inhaler 3  . aspirin EC 81 MG tablet Take 1 tablet (81 mg total) by mouth daily. 90 tablet 3  . budesonide-formoterol (SYMBICORT) 160-4.5 MCG/ACT inhaler Inhale 2 puffs into the lungs 2 (two) times daily.    . clopidogrel (PLAVIX) 75 MG tablet TAKE 1 TABLET BY MOUTH  DAILY 90 tablet 3  . ezetimibe (ZETIA) 10 MG tablet TAKE 1 TABLET BY MOUTH  EVERY MORNING 90 tablet 3  . Ibuprofen-Diphenhydramine Cit ([facility] IBUPROFEN PM) 200-38 MG TABS Take by mouth. As needed    . metFORMIN (GLUCOPHAGE) 500 MG tablet Take 500 mg by mouth 2 (two) times daily.  2  . metoprolol succinate (TOPROL-XL) 25 MG 24 hr tablet Take 25 mg by mouth daily. Take 1/2 table in the morning and 1/2 tablets in the evening    . midodrine (PROAMATINE) 2.5 MG tablet TAKE 1 TABLET BY MOUTH TWO  TIMES DAILY WITH A MEAL 180 tablet 3  . nitroGLYCERIN (NITROSTAT) 0.3 MG SL tablet DISSOLVE 1 TABLET  UNDER THE TONGUE EVERY 5 MINUTES AS  NEEDED FOR CHEST PAIN (MAX  3 TABS IN 15 MINUTES, CALL  911 IF CHEST PAIN PERSISTS) 100 tablet 3  . omega-3 acid ethyl esters (LOVAZA) 1 g capsule TAKE 1 CAPSULE BY MOUTH  DAILY 90 capsule 3  . ranolazine (RANEXA) 1000 MG SR tablet TAKE 1 TABLET BY MOUTH TWO  TIMES DAILY 180 tablet 3  . rosuvastatin (CRESTOR) 20 MG tablet TAKE 1 TABLET BY MOUTH  DAILY 90 tablet 3   Social History   Socioeconomic History  . Marital status: Married    Spouse name: Not on file  . Number of children: 2  . Years of education: college  . Highest education level: Not on file  Occupational History  . Occupation: Retired   Tobacco Use  . Smoking status: Former Smoker    Packs/day: 1.00    Years: 40.00    Pack years: 40.00    Types: Cigarettes    Quit date: 01/17/1999    Years since  quitting: 20.9  . Smokeless tobacco: Never Used  Substance and Sexual Activity  . Alcohol use: No    Alcohol/week: 0.0 standard drinks  . Drug use: No  . Sexual activity: Never  Other Topics Concern  . Not on file  Social History Narrative   Patient is married with 2 children.   Patient is right handed.   Patient has college education.   Patient drinks 12 cups daily.   Social Determinants of Health   Financial Resource Strain:   . Difficulty of Paying Living Expenses:   Food Insecurity:   . Worried About Charity fundraiser in the Last Year:   . Arboriculturist in the Last Year:   Transportation Needs:   . Film/video editor (Medical):   Marland Kitchen Lack of Transportation (Non-Medical):   Physical Activity:   . Days of Exercise per Week:   . Minutes of Exercise per Session:   Stress:   . Feeling of Stress :   Social Connections:   . Frequency of Communication with Friends and Family:   . Frequency of Social Gatherings with Friends and Family:   . Attends Religious Services:   . Active Member of Clubs or Organizations:   . Attends Archivist Meetings:   Marland Kitchen Marital Status:   Intimate Partner Violence:   . Fear of Current or Ex-Partner:   . Emotionally Abused:   Marland Kitchen Physically Abused:   . Sexually Abused:    Family History  Problem Relation Age of Onset  . Heart disease Mother   . Heart attack Mother   . Cancer Sister   . Depression Sister     OBJECTIVE:  Vitals:   12/17/19 1003  BP: 112/60  Pulse: 62  Resp: 18  Temp: 97.6 F (36.4 C)  SpO2: 95%     General appearance: alert; appears mildly fatigued, but nontoxic; speaking in full sentences and tolerating own secretions HEENT: NCAT; Ears: EACs clear, TMs pearly gray; Eyes: PERRL.  EOM grossly intact. Nose: nares patent with clear rhinorrhea, Throat: oropharynx clear, tonsils non erythematous or enlarged, uvula midline  Neck: supple without LAD Lungs: unlabored respirations, symmetrical air entry; cough:  mild; no respiratory distress; diffuse wheezes throughout bilateral lung fields Heart: regular rate and rhythm.   Skin: warm and dry Psychological: alert and cooperative; normal mood and affect   ASSESSMENT & PLAN:  1. Suspected COVID-19 virus infection   2. Cough   3. COPD  exacerbation ([place])     Meds ordered this encounter  Medications  . benzonatate (TESSALON) 100 MG capsule    Sig: Take 1 capsule (100 mg total) by mouth every 8 (eight) hours.    Dispense:  21 capsule    Refill:  0    Order Specific Question:   Supervising Provider    Answer:   Raylene Everts [id number]  . predniSONE (DELTASONE) 20 MG tablet    Sig: Take 1 tablet (20 mg total) by mouth 2 (two) times daily with a meal for 5 days.    Dispense:  10 tablet    Refill:  0    Order Specific Question:   Supervising Provider    Answer:   Raylene Everts [id number]  . azithromycin (ZITHROMAX) 250 MG tablet    Sig: Take 1 tablet (250 mg total) by mouth daily. Take first 2 tablets together, then 1 every day until finished.    Dispense:  6 tablet    Refill:  0    Order Specific Question:   Supervising Provider    Answer:   Raylene Everts [id number]   COVID testing ordered.  It will take between 2-5 days for test results.  Someone will contact you regarding abnormal results.    In the meantime: You should remain isolated in your home for 10 days from symptom onset AND greater than 72 hours after symptoms resolution (absence of fever without the use of fever-reducing medication and improvement in respiratory symptoms), whichever is longer Get plenty of rest and push fluids Tessalon Perles prescribed for cough Use OTC zyrtec for nasal congestion, runny nose, and/or sore throat Use OTC flonase for nasal congestion and runny nose Use medications daily for symptom relief Use OTC medications like ibuprofen or tylenol as needed fever or pain Call or go to the ED if you have any new or worsening symptoms such as  fever, worsening cough, shortness of breath, chest tightness, chest pain, turning blue, changes in mental status, etc...   Prednisone and z-pak sent in to pharmacy for COPD exacerbation  Reviewed expectations re: course of current medical issues. Questions answered. Outlined signs and symptoms indicating need for more acute intervention. Patient verbalized understanding. After Visit Summary given.         Lestine Box, PA-C 12/17/19 1016

## 2019-12-17 NOTE — Discharge Instructions (Signed)

## 2019-12-17 NOTE — ED Triage Notes (Signed)
Pt developed cough on Wednesday and had covid exposure last week, pt has had both covid vaccines

## 2019-12-18 ENCOUNTER — Ambulatory Visit: Payer: Medicare Other | Admitting: Internal Medicine

## 2019-12-18 LAB — SARS-COV-2, NAA 2 DAY TAT

## 2019-12-18 LAB — NOVEL CORONAVIRUS, NAA: SARS-CoV-2, NAA: DETECTED — AB

## 2019-12-19 ENCOUNTER — Ambulatory Visit (HOSPITAL_COMMUNITY)
Admission: RE | Admit: 2019-12-19 | Discharge: 2019-12-19 | Disposition: A | Discharge status: Home / Self Care | Payer: Medicare Other | Source: Ambulatory Visit | Attending: Pulmonary Disease | Admitting: Pulmonary Disease

## 2019-12-19 ENCOUNTER — Other Ambulatory Visit: Payer: Self-pay | Admitting: Unknown Physician Specialty

## 2019-12-19 ENCOUNTER — Telehealth: Payer: Self-pay | Admitting: Unknown Physician Specialty

## 2019-12-19 DIAGNOSIS — R0600 Dyspnea, unspecified: Secondary | ICD-10-CM | POA: Insufficient documentation

## 2019-12-19 DIAGNOSIS — Z23 Encounter for immunization: Secondary | ICD-10-CM | POA: Insufficient documentation

## 2019-12-19 DIAGNOSIS — U071 COVID-19: Secondary | ICD-10-CM | POA: Diagnosis not present

## 2019-12-19 DIAGNOSIS — R0609 Other forms of dyspnea: Secondary | ICD-10-CM

## 2019-12-19 DIAGNOSIS — I2510 Atherosclerotic heart disease of native coronary artery without angina pectoris: Secondary | ICD-10-CM

## 2019-12-19 MED ORDER — ALBUTEROL SULFATE HFA 108 (90 BASE) MCG/ACT IN AERS: 2.0000 | INHALATION_SPRAY | Freq: Once | RESPIRATORY_TRACT | Status: DC | PRN

## 2019-12-19 MED ORDER — SODIUM CHLORIDE 0.9 % IV SOLN: INTRAVENOUS | Status: DC | PRN

## 2019-12-19 MED ORDER — SODIUM CHLORIDE 0.9 % IV SOLN
Freq: Once | INTRAVENOUS | Status: DC
  Filled 2019-12-19: qty 20

## 2019-12-19 MED ORDER — EPINEPHRINE 0.3 MG/0.3ML IJ SOAJ: 0.3000 mg | Freq: Once | INTRAMUSCULAR | Status: DC | PRN

## 2019-12-19 MED ORDER — DIPHENHYDRAMINE HCL 50 MG/ML IJ SOLN: 50.0000 mg | Freq: Once | INTRAMUSCULAR | Status: DC | PRN

## 2019-12-19 MED ORDER — FAMOTIDINE IN NACL 20-0.9 MG/50ML-% IV SOLN: 20.0000 mg | Freq: Once | INTRAVENOUS | Status: DC | PRN

## 2019-12-19 MED ORDER — SODIUM CHLORIDE 0.9 % IV SOLN
Freq: Once | INTRAVENOUS | Status: AC
  Filled 2019-12-19: qty 20

## 2019-12-19 MED ORDER — METHYLPREDNISOLONE SODIUM SUCC 125 MG IJ SOLR: 125.0000 mg | Freq: Once | INTRAMUSCULAR | Status: DC | PRN

## 2019-12-19 NOTE — Progress Notes (Signed)
  Diagnosis: COVID-19  Physician: Dr. Joya Gaskins  Procedure: [facility] Infusion [facility] Med: bamlanivimab\etesevimab infusion - Provided patient with bamlanimivab\etesevimab fact sheet for patients, parents and caregivers prior to infusion.  Complications: No immediate complications noted.  Discharge: Discharged home   Janine Ores 12/19/2019

## 2019-12-19 NOTE — Discharge Instructions (Signed)
COVID-19 COVID-19 is a respiratory infection that is caused by a virus called severe acute respiratory syndrome coronavirus 2 (SARS-CoV-2). The disease is also known as coronavirus disease or novel coronavirus. In some people, the virus may not cause any symptoms. In others, it may cause a serious infection. The infection can get worse quickly and can lead to complications, such as:  Pneumonia, or infection of the lungs.  Acute respiratory distress syndrome or ARDS. This is a condition in which fluid build-up in the lungs prevents the lungs from filling with air and passing oxygen into the blood.  Acute respiratory failure. This is a condition in which there is not enough oxygen passing from the lungs to the body or when carbon dioxide is not passing from the lungs out of the body.  Sepsis or septic shock. This is a serious bodily reaction to an infection.  Blood clotting problems.  Secondary infections due to bacteria or fungus.  Organ failure. This is when your body's organs stop working. The virus that causes COVID-19 is contagious. This means that it can spread from person to person through droplets from coughs and sneezes (respiratory secretions). What are the causes? This illness is caused by a virus. You may catch the virus by:  Breathing in droplets from an infected person. Droplets can be spread by a person breathing, speaking, singing, coughing, or sneezing.  Touching something, like a table or a doorknob, that was exposed to the virus (contaminated) and then touching your mouth, nose, or eyes. What increases the risk? Risk for infection You are more likely to be infected with this virus if you:  Are within 6 feet (2 meters) of a person with COVID-19.  Provide care for or live with a person who is infected with COVID-19.  Spend time in crowded indoor spaces or live in shared housing. Risk for serious illness You are more likely to become seriously ill from the virus if you:   Are 50 years of age or older. The higher your age, the more you are at risk for serious illness.  Live in a nursing home or long-term care facility.  Have cancer.  Have a long-term (chronic) disease such as: ? Chronic lung disease, including chronic obstructive pulmonary disease or asthma. ? A long-term disease that lowers your body's ability to fight infection (immunocompromised). ? Heart disease, including heart failure, a condition in which the arteries that lead to the heart become narrow or blocked (coronary artery disease), a disease which makes the heart muscle thick, weak, or stiff (cardiomyopathy). ? Diabetes. ? Chronic kidney disease. ? Sickle cell disease, a condition in which red blood cells have an abnormal "sickle" shape. ? Liver disease.  Are obese. What are the signs or symptoms? Symptoms of this condition can range from mild to severe. Symptoms may appear any time from 2 to 14 days after being exposed to the virus. They include:  A fever or chills.  A cough.  Difficulty breathing.  Headaches, body aches, or muscle aches.  Runny or stuffy (congested) nose.  A sore throat.  New loss of taste or smell. Some people may also have stomach problems, such as nausea, vomiting, or diarrhea. Other people may not have any symptoms of COVID-19. How is this diagnosed? This condition may be diagnosed based on:  Your signs and symptoms, especially if: ? You live in an area with a COVID-19 outbreak. ? You recently traveled to or from an area where the virus is common. ? You   provide care for or live with a person who was diagnosed with COVID-19. ? You were exposed to a person who was diagnosed with COVID-19.  A physical exam.  Lab tests, which may include: ? Taking a sample of fluid from the back of your nose and throat (nasopharyngeal fluid), your nose, or your throat using a swab. ? A sample of mucus from your lungs (sputum). ? Blood tests.  Imaging tests, which  may include, X-rays, CT scan, or ultrasound. How is this treated? At present, there is no medicine to treat COVID-19. Medicines that treat other diseases are being used on a trial basis to see if they are effective against COVID-19. Your health care provider will talk with you about ways to treat your symptoms. For most people, the infection is mild and can be managed at home with rest, fluids, and over-the-counter medicines. Treatment for a serious infection usually takes places in a hospital intensive care unit (ICU). It may include one or more of the following treatments. These treatments are given until your symptoms improve.  Receiving fluids and medicines through an IV.  Supplemental oxygen. Extra oxygen is given through a tube in the nose, a face mask, or a hood.  Positioning you to lie on your stomach (prone position). This makes it easier for oxygen to get into the lungs.  Continuous positive airway pressure (CPAP) or bi-level positive airway pressure (BPAP) machine. This treatment uses mild air pressure to keep the airways open. A tube that is connected to a motor delivers oxygen to the body.  Ventilator. This treatment moves air into and out of the lungs by using a tube that is placed in your windpipe.  Tracheostomy. This is a procedure to create a hole in the neck so that a breathing tube can be inserted.  Extracorporeal membrane oxygenation (ECMO). This procedure gives the lungs a chance to recover by taking over the functions of the heart and lungs. It supplies oxygen to the body and removes carbon dioxide. Follow these instructions at home: Lifestyle  If you are sick, stay home except to get medical care. Your health care provider will tell you how long to stay home. Call your health care provider before you go for medical care.  Rest at home as told by your health care provider.  Do not use any products that contain nicotine or tobacco, such as cigarettes, e-cigarettes, and  chewing tobacco. If you need help quitting, ask your health care provider.  Return to your normal activities as told by your health care provider. Ask your health care provider what activities are safe for you. General instructions  Take over-the-counter and prescription medicines only as told by your health care provider.  Drink enough fluid to keep your urine pale yellow.  Keep all follow-up visits as told by your health care provider. This is important. How is this prevented?  There is no vaccine to help prevent COVID-19 infection. However, there are steps you can take to protect yourself and others from this virus. To protect yourself:   Do not travel to areas where COVID-19 is a risk. The areas where COVID-19 is reported change often. To identify high-risk areas and travel restrictions, check the CDC travel website: wwwnc.cdc.gov/travel/notices  If you live in, or must travel to, an area where COVID-19 is a risk, take precautions to avoid infection. ? Stay away from people who are sick. ? Wash your hands often with soap and water for 20 seconds. If soap and water   are not available, use an alcohol-based hand sanitizer. ? Avoid touching your mouth, face, eyes, or nose. ? Avoid going out in public, follow guidance from your state and local health authorities. ? If you must go out in public, wear a cloth face covering or face mask. Make sure your mask covers your nose and mouth. ? Avoid crowded indoor spaces. Stay at least 6 feet (2 meters) away from others. ? Disinfect objects and surfaces that are frequently touched every day. This may include:  Counters and tables.  Doorknobs and light switches.  Sinks and faucets.  Electronics, such as phones, remote controls, keyboards, computers, and tablets. To protect others: If you have symptoms of COVID-19, take steps to prevent the virus from spreading to others.  If you think you have a COVID-19 infection, contact your health care  provider right away. Tell your health care team that you think you may have a COVID-19 infection.  Stay home. Leave your house only to seek medical care. Do not use public transport.  Do not travel while you are sick.  Wash your hands often with soap and water for 20 seconds. If soap and water are not available, use alcohol-based hand sanitizer.  Stay away from other members of your household. Let healthy household members care for children and pets, if possible. If you have to care for children or pets, wash your hands often and wear a mask. If possible, stay in your own room, separate from others. Use a different bathroom.  Make sure that all people in your household wash their hands well and often.  Cough or sneeze into a tissue or your sleeve or elbow. Do not cough or sneeze into your hand or into the air.  Wear a cloth face covering or face mask. Make sure your mask covers your nose and mouth. Where to find more information  Centers for Disease Control and Prevention: www.cdc.gov/coronavirus/2019-ncov/index.html  World Health Organization: www.who.int/health-topics/coronavirus Contact a health care provider if:  You live in or have traveled to an area where COVID-19 is a risk and you have symptoms of the infection.  You have had contact with someone who has COVID-19 and you have symptoms of the infection. Get help right away if:  You have trouble breathing.  You have pain or pressure in your chest.  You have confusion.  You have bluish lips and fingernails.  You have difficulty waking from sleep.  You have symptoms that get worse. These symptoms may represent a serious problem that is an emergency. Do not wait to see if the symptoms will go away. Get medical help right away. Call your local emergency services (911 in the [place] Do not drive yourself to the hospital. Let the emergency medical personnel know if you think you have COVID-19. Summary  COVID-19 is a  respiratory infection that is caused by a virus. It is also known as coronavirus disease or novel coronavirus. It can cause serious infections, such as pneumonia, acute respiratory distress syndrome, acute respiratory failure, or sepsis.  The virus that causes COVID-19 is contagious. This means that it can spread from person to person through droplets from breathing, speaking, singing, coughing, or sneezing.  You are more likely to develop a serious illness if you are 50 years of age or older, have a weak immune system, live in a nursing home, or have chronic disease.  There is no medicine to treat COVID-19. Your health care provider will talk with you about ways to treat your symptoms.    Take steps to protect yourself and others from infection. Wash your hands often and disinfect objects and surfaces that are frequently touched every day. Stay away from people who are sick and wear a mask if you are sick. This information is not intended to replace advice given to you by your health care provider. Make sure you discuss any questions you have with your health care provider. Document Revised: 06/08/2019 Document Reviewed: 09/14/2018 Elsevier Patient Education  2020 [employer] Inc[employer]  

## 2019-12-19 NOTE — Telephone Encounter (Signed)
  I connected by phone with Yevonne Pax on 12/19/2019 at 8:05 AM to discuss the potential use of an new treatment for mild to moderate COVID-19 viral infection in non-hospitalized patients.  This patient is a 79 y.o. male that meets the FDA criteria for Emergency Use Authorization of bamlanivimab/etesevimab or casirivimab/imdevimab.  Has a (+) direct SARS-CoV-2 viral test result  Has mild or moderate COVID-19   Is ? 79 years of age and weighs ? 40 kg  Is NOT hospitalized due to COVID-19  Is NOT requiring oxygen therapy or requiring an increase in baseline oxygen flow rate due to COVID-19  Is within 10 days of symptom onset  Has at least one of the high risk factor(s) for progression to severe COVID-19 and/or hospitalization as defined in EUA.  Specific high risk criteria : >/= 77 yo   I have spoken and communicated the following to the patient or parent/caregiver:  1. FDA has authorized the emergency use of bamlanivimab/etesevimab and casirivimab\imdevimab for the treatment of mild to moderate COVID-19 in adults and pediatric patients with positive results of direct SARS-CoV-2 viral testing who are 58 years of age and older weighing at least 40 kg, and who are at high risk for progressing to severe COVID-19 and/or hospitalization.  2. The significant known and potential risks and benefits of bamlanivimab/etesevimab and casirivimab\imdevimab, and the extent to which such potential risks and benefits are unknown.  3. Information on available alternative treatments and the risks and benefits of those alternatives, including clinical trials.  4. Patients treated with bamlanivimab/etesevimab and casirivimab\imdevimab should continue to self-isolate and use infection control measures (e.g., wear mask, isolate, social distance, avoid sharing personal items, clean and disinfect "high touch" surfaces, and frequent handwashing) according to CDC guidelines.   5. The patient or parent/caregiver  has the option to accept or refuse bamlanivimab/etesevimab or casirivimab\imdevimab .  After reviewing this information with the patient, The patient agreed to proceed with receiving the bamlanimivab infusion and will be provided a copy of the Fact sheet prior to receiving the infusion.Kathrine Haddock 12/19/2019 8:05 AM   12/11/2019

## 2020-01-20 ENCOUNTER — Other Ambulatory Visit: Payer: Self-pay | Admitting: Cardiovascular Disease

## 2020-02-07 ENCOUNTER — Other Ambulatory Visit: Payer: Self-pay | Admitting: Cardiovascular Disease

## 2020-02-18 ENCOUNTER — Ambulatory Visit (INDEPENDENT_AMBULATORY_CARE_PROVIDER_SITE_OTHER): Payer: Medicare Other | Admitting: Internal Medicine

## 2020-02-18 ENCOUNTER — Ambulatory Visit (INDEPENDENT_AMBULATORY_CARE_PROVIDER_SITE_OTHER): Payer: Medicare Other

## 2020-02-18 ENCOUNTER — Other Ambulatory Visit: Payer: Self-pay

## 2020-02-18 ENCOUNTER — Encounter: Payer: Self-pay | Admitting: Internal Medicine

## 2020-02-18 VITALS — BP 120/72 | HR 60 | Temp 97.6°F | Ht 70.0 in | Wt 202.8 lb

## 2020-02-18 DIAGNOSIS — J449 Chronic obstructive pulmonary disease, unspecified: Secondary | ICD-10-CM

## 2020-02-18 DIAGNOSIS — R0600 Dyspnea, unspecified: Secondary | ICD-10-CM | POA: Diagnosis not present

## 2020-02-18 DIAGNOSIS — I25810 Atherosclerosis of coronary artery bypass graft(s) without angina pectoris: Secondary | ICD-10-CM | POA: Diagnosis not present

## 2020-02-18 DIAGNOSIS — R0609 Other forms of dyspnea: Secondary | ICD-10-CM

## 2020-02-18 MED ORDER — BREZTRI AEROSPHERE 160-9-4.8 MCG/ACT IN AERO: 2.0000 | INHALATION_SPRAY | Freq: Two times a day (BID) | RESPIRATORY_TRACT | 0 refills | Status: AC

## 2020-02-18 NOTE — Progress Notes (Signed)
Patient identification verified. Results of recent CXR reviewed.  Per Dr. Annamaria Boots, CXR- stable. Lungs are over-expanded, which is seen with COPD, but cleat . Old cardiac surgery changes, but nothing new or concerning.   Patient and wife verbalized understanding of results.

## 2020-02-18 NOTE — Progress Notes (Signed)
HPI male former smoker followed for COPD/emphysema, asbestos exposure, OSA/ failed CPAP, complicated by CAD/CABG 4V, DM, history major trauma leg, prostate CA NPSG 06/01/15- AHI 48.1/ hr, desat to 81%, CPAP to 13, body weight 220 pounds Noncompliant with CPAP and not willing to explore alternatives. PFT 01/01/13- minimal obstructive airways disease with air trapping, mild response to bronchodilator, diffusion slightly reduced. FVC 4.11/96%, FEV1 2.59/82%, ratio 0.63, FEV1 25-75 %-65/83%, TLC 100%, DLCO 76% Echocardiogram 10/28/16-EF 55-60 percent, grade 1 diastolic dysfunction, PA pressure 35 Walk Test on room air 12/27/16-94%/HR 72, 93%/HR 79, 93%/HR 81, 94%/HR 88. No desaturation or tachycardia with this exercise for 3180 feet. PFT: 03/11/17-mild obstruction, mild diffusion defect. No response to dilator. FVC 3.73/90%, FEV1 2.33/78%, ratio 0.62, TLC 106%, DLCO 60%. Walk Test 02/18/20- Lowest sat 94%, max HR 90. --------------------------------------------------------------------------------------------  03/11/17-79 year old male former smoker followed for COPD/emphysema, asbestos exposure, OSA/ failed CPAP, complicated by CAD/CABG 4V, DM, history major trauma leg, prostate CA FOLLOWS FOR: Pt had PFT today and states his breathing has been doing well since last OV. Trelegy is the past inhaler he has worked with. Little wheeze or cough. Adjustable bed helps. He still refuses any treatment for OSA. PFT: 03/11/17-mild obstruction, mild diffusion defect. No response to dilator. FVC 3.73/90%, FEV1 2.33/78%, ratio 0.62, TLC 106%, DLCO 60%.  02/18/20- 79 year old male former smoker followed for COPD/emphysema, asbestos exposure, OSA/ failed CPAP, complicated by CAD/CABG 4V, DM, history major trauma leg,  prostate CA, Covid infection 06/2018,. O2 3L was dc'd years ago.  Albuterol hfa, Symbicort 160 No longer drives- wife here. Dr Claiborne Billings recommended he see [place] again. Reports very ill, treated at home, with Covid  infection Nov, 2019. Had 2 Phizer vax.  Tested antibody positive at [facility] in April, 2021 and treated with prednisone and Zith.  Largely resolved. Litle cough, some wheeze. Strangles easily with food/ drink. DOE to [employer] and back. Most of this is not new. Some wheeze.  Wife asks about getting home O2- mainly interested in [place]. Cardiology continues to follow. Walk Test 02/18/20- Lowest sat 94%, max HR 90.  ROS-see HPI  + = positive Constitutional:   No-   weight loss, night sweats, fevers, chills, +fatigue, lassitude. HEENT:   No-  headaches, difficulty swallowing, tooth/dental problems, sore throat,       No-  sneezing, itching, ear ache, nasal congestion, post nasal drip,  CV:  No-   chest pain, orthopnea, PND, swelling in lower extremities, anasarca, dizziness, palpitations Resp: + shortness of breath with exertion or at rest.              No-   productive cough,  + non-productive cough,  No- coughing up of blood.              No-   change in color of mucus.  No- wheezing.   Skin: No-   rash or lesions. GI:  No-   heartburn, indigestion, abdominal pain, nausea, vomiting, GU:  MS:  No-   joint pain or swelling.  . Neuro-     nothing unusual Psych:  No- change in mood or affect. + depression or anxiety.  No memory loss.  OBJ- Physical Exam  stable baseline exam General- Alert, Oriented, Affect-appropriate, Distress- none acute.  looks very comfortable but not strong Skin- rash-none, lesions- none, excoriation- none Lymphadenopathy- none Head- atraumatic            Eyes- Gross vision intact, PERRLA, conjunctivae and secretions clear  Ears- Hearing, canals-normal            Nose-  Clear, no-Septal dev, mucus, polyps, erosion, perforation             Throat- Mallampati II , mucosa clear , drainage- none, tonsils- atrophic Neck- flexible , trachea midline, no stridor , thyroid nl, carotid no bruit Chest - symmetrical excursion , unlabored           Heart/CV- RRR , no murmur  , no gallop  , no rub, nl s1 s2                           - JVD- none , edema- none, stasis changes- none, varices- none           Lung-  clear to P&A, wheeze- none, cough-none , dullness-none, rub- none           Chest wall-  Abd-  Br/ Gen/ Rectal- Not done, not indicated Extrem- cyanosis- none, clubbing, none, atrophy- none, strength- nl. -+cane. Support stockings Neuro- grossly intact to observation

## 2020-02-18 NOTE — Assessment & Plan Note (Signed)
PFTs have shown only mild obstruction and no desaturation today on walk test. Suspect most of  his exertional dyspnea reflects cardiovascular limits, weakness/ deconditioning and his unsteadiness.  Plan- CXR  Samples of Breztri for trial, overnight oximetry.

## 2020-02-18 NOTE — Patient Instructions (Signed)
Order- CXR   Dx COPD mixed type  Order- schedule overnight oximetry   Dx COPD mixed type  Order- walk test- O2 qualifying    COPD mixed type  Sample x 2 Breztri maintenance inhaler   Inhale 2 puffs then rinse mouth twice daily. Try this instead of Symbicort. When the samples run out, either go back to Symbicort or call us for prescription for Daniel Singleton if it worked better.   Please call as neeeded

## 2020-02-23 NOTE — Assessment & Plan Note (Signed)
I don't think exertion is limited by pulmonary function. He is not going to qualify for the portable O2 his wife is wanting. Deconditioning, cardiovascular limits are more significant.

## 2020-04-01 ENCOUNTER — Telehealth: Payer: Self-pay | Admitting: Cardiovascular Disease

## 2020-04-01 NOTE — Telephone Encounter (Signed)
Left message to call back  Operator/scheduler -- please arrange first available routine 6 month visit appointment with MD and place on cancellation list  Offer APP visit as well

## 2020-04-01 NOTE — Telephone Encounter (Signed)
Daniel Singleton was calling to schedule his 6 month f/u with Dr. Claiborne Billings due to receving a letter. When I advised him it was due in October and Dr. Evette Georges first available after his due date is 07/24/20 he stated he will be moving. He did not give an exact date, but stated it would be sometime between now and the 1st of September. He will be moving to [place]. Please advise.

## 2020-04-02 NOTE — Telephone Encounter (Signed)
OV 04/22/20 with Doreene Adas PA

## 2020-04-14 NOTE — Progress Notes (Signed)
Cardiology Office Note:    Date:  04/22/2020   ID:  Daniel Singleton, DOB 21-Feb-1941, MRN [id number]  PCP:  Glenis Smoker, MD  Cardiologist:  Shelva Majestic, MD   Referring MD: Glenis Smoker, *   Chief Complaint  Patient presents with   Follow-up    CAD    History of Present Illness:    Daniel Singleton is a 79 y.o. male with a hx of HTN, HLD, COPD, prior asbestos exposure, DM, and prostate cancer. He is on 3L O2 and OSA with CPAP/ CAD s/p CABG x 4 08/2004 in [place]. He underwent stress testing followed by heart cath in 2013 that showed "mild blockages." He had repeat nuclear stress test 02/2015 with Dr. Claiborne Billings for chest pain that was intermediate with ST changes and perfusion defect in the RCA territory. Dr. Claiborne Billings reviewed past records from [facility] an felt this was not a new defect and likely had low probability of representing ischemia. He has had problems with ataxia and has seen neurology. Echo in 10/2016 with EF 55-60% and no WMA, grade 1 DD, PA pressures 35 mmHg. Nuclear stress test 10/2016 negative for ischemia. Mild nonobstructive (1-39%) bilateral carotid artery stenosis by [place]. He has also had problems with orthostatic hypotension in 10/2016. Imdur D/C'ed and this resolved. He was unable to wean off ranexa due to recurrent angina. He was also noncompliant with CPAP therapy in 11/2017. He was last seen by Dr. Claiborne Billings 12/05/19 and reported DOE. He was on 12.5/25 mg toprol daily, ASA, plavix, and crestor, zetia, and omega-3 fatty acids. Echo 09/07/19 showed EF 60-65% with hypokinesis of the basal anteroseptal wall consistent with occlusion of LAD and patent bypass graft to distal LAD, grade 1 DD, elevated PA pressure to 40 mmHg. Nuclear stress test was low risk.   He unfortunately tested positive for COVID-19 12/17/19, right after his last cardiologist visit.   He presents today early for his 6 month follow up . He will be moving to [place], [place].   He reports chest twice this  month that required 2 nitro to relieve CP. He previously did not tolerate imdur, as above. He is on max dose of ranexa. Pressure is mildly elevated today. I'm inclined to allow him to run higher since he has frequent dizzy spells since COVID infection. He has lost weight - unintentional. Will need to monitor this. He and his wife are looking forward to the move to cooler weather. She recently fell and broke her wrist. He is doing well on ASA and plavix.     Past Medical History:  Diagnosis Date   Angina at rest [facility])    Anxiety    Arthritis    Asthma    Coronary artery disease    recently started seeing Dr. Claiborne Billings    Depression    Diabetes [facility])    Dizziness    Elevated PSA    Emphysema    GERD (gastroesophageal reflux disease)    Hx of radiation therapy 09/20/13- 11/14/13   prostate 7800 cGy 40 sessions, seminal vesicles 5600 cGy in 40 sessions   Hypercholesteremia    Hypertension    PONV (postoperative nausea and vomiting)    Prostate cancer ([place]) 05/23/13   gleason 3+4=7, volume 31 cc   Sleep apnea    c pap with oxygen at 2.5 L    Past Surgical History:  Procedure Laterality Date   BACK SURGERY  2006   discectomy lumbar   BACK SURGERY  BACK SURGERY     degloving rt leg injury  2000   had skin grafts   FOOT SURGERY     heart bypass  2006   [facility]   HERNIA REPAIR  2008   x 3 w/mesh, bilat inguinla, umbilical   PROSTATE BIOPSY N/A 05/23/2013   Procedure: BIOPSY TRANSRECTAL ULTRASONIC PROSTATE (TUBP);  Surgeon: Molli Hazard, MD;  Location: [facility]S;  Service: Urology;  Laterality: N/A;  prostate nerve block   TONSILLECTOMY     w/adenoidectomy    Current Medications: Current Meds  Medication Sig   albuterol (PROVENTIL HFA;VENTOLIN HFA) 108 (90 Base) MCG/ACT inhaler Inhale 2 puffs into the lungs every 6 (six) hours as needed for wheezing.   aspirin EC 81 MG tablet Take 1 tablet (81 mg total) by mouth daily.   benzonatate (TESSALON) 100  MG capsule Take 1 capsule (100 mg total) by mouth every 8 (eight) hours.   Budeson-Glycopyrrol-Formoterol (BREZTRI AEROSPHERE) 160-9-4.8 MCG/ACT AERO Inhale 2 puffs into the lungs in the morning and at bedtime.   budesonide-formoterol (SYMBICORT) 160-4.5 MCG/ACT inhaler Inhale 2 puffs into the lungs 2 (two) times daily.   clopidogrel (PLAVIX) 75 MG tablet TAKE 1 TABLET BY MOUTH  DAILY   DULoxetine (CYMBALTA) 60 MG capsule Take 60 mg by mouth daily.   ezetimibe (ZETIA) 10 MG tablet TAKE 1 TABLET BY MOUTH  EVERY MORNING   Ibuprofen-Diphenhydramine Cit ([employer] IBUPROFEN PM) 200-38 MG TABS Take by mouth. As needed   metFORMIN (GLUCOPHAGE) 500 MG tablet Take 500 mg by mouth 2 (two) times daily.   metoprolol succinate (TOPROL-XL) 25 MG 24 hr tablet TAKE 1 AND 1/2 TABLETS BY  MOUTH DAILY   midodrine (PROAMATINE) 2.5 MG tablet TAKE 1 TABLET BY MOUTH TWO  TIMES DAILY WITH A MEAL (Patient taking differently: Take 2.5 mg by mouth daily. )   nitroGLYCERIN (NITROSTAT) 0.3 MG SL tablet DISSOLVE 1 TABLET UNDER THE TONGUE EVERY 5 MINUTES AS  NEEDED FOR CHEST PAIN (MAX  3 TABS IN 15 MINUTES, CALL  911 IF CHEST PAIN PERSISTS)   omega-3 acid ethyl esters (LOVAZA) 1 g capsule TAKE 1 CAPSULE BY MOUTH  DAILY   ranolazine (RANEXA) 1000 MG SR tablet TAKE 1 TABLET BY MOUTH TWO  TIMES DAILY   rosuvastatin (CRESTOR) 20 MG tablet TAKE 1 TABLET BY MOUTH  DAILY     Allergies:   Niacin and related   Social History   Socioeconomic History   Marital status: Married    Spouse name: Not on file   Number of children: 2   Years of education: college   Highest education level: Not on file  Occupational History   Occupation: Retired   Tobacco Use   Smoking status: Former Smoker    Packs/day: 1.00    Years: 40.00    Pack years: 40.00    Types: Cigarettes    Quit date: 01/17/1999    Years since quitting: 21.2   Smokeless tobacco: Never Used  Substance and Sexual Activity   Alcohol use: No     Alcohol/week: 0.0 standard drinks   Drug use: No   Sexual activity: Never  Other Topics Concern   Not on file  Social History Narrative   Patient is married with 2 children.   Patient is right handed.   Patient has college education.   Patient drinks 12 cups daily.   Social Determinants of Health   Financial Resource Strain:    Difficulty of Paying Living Expenses: Not on file  Food Insecurity:    Worried About Charity fundraiser in the Last Year: Not on file   [employer] of Food in the Last Year: Not on file  Transportation Needs:    Lack of Transportation (Medical): Not on file   Lack of Transportation (Non-Medical): Not on file  Physical Activity:    Days of Exercise per Week: Not on file   Minutes of Exercise per Session: Not on file  Stress:    Feeling of Stress : Not on file  Social Connections:    Frequency of Communication with Friends and Family: Not on file   Frequency of Social Gatherings with Friends and Family: Not on file   Attends Religious Services: Not on file   Active Member of Clubs or Organizations: Not on file   Attends Archivist Meetings: Not on file   Marital Status: Not on file     Family History: The patient's family history includes Cancer in his sister; Depression in his sister; Heart attack in his mother; Heart disease in his mother.  ROS:   Please see the history of present illness.     All other systems reviewed and are negative.  EKGs/Labs/Other Studies Reviewed:    The following studies were reviewed today:  Nuclear stress test 09/13/19:  Nuclear stress EF: 48%. However, echocardiogram demonstrated normal ejection fraction of 60 to 65%.  There was no ST segment deviation noted during stress.  Defect 1: There is a small defect of mild severity present in the basal anteroseptal and basal inferoseptal location. No ischemia identified.  Overall low risk nuclear stress test with no areas of significant ischemia  identified.   Echo 09/07/19: 1. Left ventricular ejection fraction, by visual estimation, is 60 to  65%. The left ventricle has normal function. There is no left ventricular  hypertrophy.  2. Severe hypokinesis of the left ventricular, basal anteroseptal wall.  This is consistent with occlusion of the LAD artery and patent bypas graft  to the distal LAD.  3. Left ventricular diastolic parameters are consistent with Grade I  diastolic dysfunction (impaired relaxation).  4. The left ventricle demonstrates regional wall motion abnormalities.  5. Global right ventricle has normal systolic function.The right  ventricular size is normal. No increase in right ventricular wall  thickness.  6. Left atrial size was mildly dilated.  7. Right atrial size was normal.  8. The mitral valve is normal in structure. Trivial mitral valve  regurgitation. No evidence of mitral stenosis.  9. The tricuspid valve is normal in structure.  10. The aortic valve is normal in structure. Aortic valve regurgitation is  not visualized. Mild aortic valve sclerosis without stenosis.  11. The pulmonic valve was normal in structure. Pulmonic valve  regurgitation is not visualized.  12. Moderately elevated pulmonary artery systolic pressure.  13. The inferior vena cava is normal in size with greater than 50%  respiratory variability, suggesting right atrial pressure of 3 mmHg.  14. The average left ventricular global longitudinal strain is -20.7 %.   EKG:  EKG is not ordered today.   Recent Labs: 10/23/2019: TSH 2.610  Recent Lipid Panel    Component Value Date/Time   CHOL 98 10/15/2016 0858   CHOL 118 08/01/2013 0952   TRIG 67 10/15/2016 0858   TRIG 96 08/01/2013 0952   HDL 38 (L) 10/15/2016 0858   HDL 46 08/01/2013 0952   CHOLHDL 2.6 10/15/2016 0858   VLDL 13 10/15/2016 0858   LDLCALC 47 10/15/2016  0858   LDLCALC 53 08/01/2013 0952    Physical Exam:    VS:  BP (!) 158/80    Pulse 70    Ht 5'  10" (1.778 m)    Wt 196 lb 12.8 oz (89.3 kg)    SpO2 96%    BMI 28.24 kg/m     Wt Readings from Last 3 Encounters:  04/22/20 196 lb 12.8 oz (89.3 kg)  02/18/20 202 lb 12.8 oz (92 kg)  12/05/19 201 lb 12.8 oz (91.5 kg)     GEN: elderly male in NAD HEENT: Normal NECK: No JVD; No carotid bruits LYMPHATICS: No lymphadenopathy CARDIAC: RRR, no murmurs, rubs, gallops RESPIRATORY:  Clear to auscultation without rales, wheezing or rhonchi  ABDOMEN: Soft, non-tender, non-distended MUSCULOSKELETAL:  Compression stocking on RLE since accident, mild edema baseline for him   SKIN: Warm and dry NEUROLOGIC:  Alert and oriented x 3 PSYCHIATRIC:  Normal affect   ASSESSMENT:    1. Coronary artery disease involving native coronary artery of native heart without angina pectoris   2. Hx of CABG   3. Dizziness   4. Hyperlipidemia with target LDL less than 70   5. Orthostatic hypotension    PLAN:    In order of problems listed above:  CAD s/p CABG x 4 - most recent nuclear stress test 09/13/19 was low risk - most recent echo with preserved EF and mild DD, mild pulmonary hypertension - continue ASA and plavix - continue ranexa, was unable to wean off previously due to recurrent angina - occasional angina controlled with SL nitro   Hypertension Orthostatic hypotension - midodrine once daily - continue to have dizziness on occasion since COVID infection - recheck today 128/68   Hyperlipidemia - continue 20 mg crestor, zetia, and omega-3 fatty acid   Follow up with [place] PRN. He states he has plenty of medications.   He plans to follow up with Dr. Alveta Heimlich in [place], [place]. We will call their office to get him an appt.   Medication Adjustments/Labs and Tests Ordered: Current medicines are reviewed at length with the patient today.  Concerns regarding medicines are outlined above.  No orders of the defined types were placed in this encounter.  No orders of the defined types were placed in  this encounter.   Signed, Ledora Bottcher, PA  04/22/2020 2:18 PM    [facility]

## 2020-04-22 ENCOUNTER — Other Ambulatory Visit: Payer: Self-pay

## 2020-04-22 ENCOUNTER — Encounter: Payer: Self-pay | Admitting: Physician Assistant

## 2020-04-22 ENCOUNTER — Ambulatory Visit (INDEPENDENT_AMBULATORY_CARE_PROVIDER_SITE_OTHER): Payer: Medicare Other | Admitting: Physician Assistant

## 2020-04-22 VITALS — BP 128/68 | HR 70 | Ht 70.0 in | Wt 196.8 lb

## 2020-04-22 DIAGNOSIS — Z951 Presence of aortocoronary bypass graft: Secondary | ICD-10-CM | POA: Diagnosis not present

## 2020-04-22 DIAGNOSIS — R42 Dizziness and giddiness: Secondary | ICD-10-CM | POA: Diagnosis not present

## 2020-04-22 DIAGNOSIS — I951 Orthostatic hypotension: Secondary | ICD-10-CM

## 2020-04-22 DIAGNOSIS — I2510 Atherosclerotic heart disease of native coronary artery without angina pectoris: Secondary | ICD-10-CM

## 2020-04-22 DIAGNOSIS — E785 Hyperlipidemia, unspecified: Secondary | ICD-10-CM | POA: Diagnosis not present

## 2020-04-22 DIAGNOSIS — I25810 Atherosclerosis of coronary artery bypass graft(s) without angina pectoris: Secondary | ICD-10-CM

## 2020-04-22 NOTE — Patient Instructions (Addendum)
Medication Instructions:  No changes *If you need a refill on your cardiac medications before your next appointment, please call your pharmacy*   Lab Work: None ordered If you have labs (blood work) drawn today and your tests are completely normal, you will receive your results only by: Marland Kitchen MyChart Message (if you have MyChart) OR . A paper copy in the mail If you have any lab test that is abnormal or we need to change your treatment, we will call you to review the results.   Testing/Procedures: None ordered   Follow-Up: At [facility], you and your health needs are our priority.  As part of our continuing mission to provide you with exceptional heart care, we have created designated Provider Care Teams.  These Care Teams include your primary Cardiologist (physician) and Advanced Practice Providers (APPs -  Physician Assistants and Nurse Practitioners) who all work together to provide you with the care you need, when you need it.  We recommend signing up for the patient portal called "MyChart".  Sign up information is provided on this After Visit Summary.  MyChart is used to connect with patients for Virtual Visits (Telemedicine).  Patients are able to view lab/test results, encounter notes, upcoming appointments, etc.  Non-urgent messages can be sent to your provider as well.   To learn more about what you can do with MyChart, go to [email or web address].

## 2020-05-22 ENCOUNTER — Ambulatory Visit: Payer: Medicare Other | Admitting: Internal Medicine

## 2020-06-17 ENCOUNTER — Other Ambulatory Visit: Payer: Self-pay | Admitting: Cardiovascular Disease

## 2020-07-16 ENCOUNTER — Other Ambulatory Visit: Payer: Self-pay | Admitting: Cardiovascular Disease

## 2020-11-28 ENCOUNTER — Other Ambulatory Visit: Payer: Self-pay | Admitting: Cardiovascular Disease

## 2021-04-21 ENCOUNTER — Other Ambulatory Visit: Payer: Self-pay | Admitting: Cardiovascular Disease

## 2021-07-15 ENCOUNTER — Other Ambulatory Visit: Payer: Self-pay | Admitting: Cardiovascular Disease

## 2022-09-06 ENCOUNTER — Other Ambulatory Visit: Payer: Self-pay | Admitting: Cardiovascular Disease
